# Patient Record
Sex: Female | Born: 1985 | Race: White | Hispanic: No | Marital: Single | Smoking: Current some day smoker
Health system: Southern US, Community
[De-identification: ages and names within clinical notes are randomized; demographics above are authoritative.]

## PROBLEM LIST (undated history)

## (undated) DIAGNOSIS — G4730 Sleep apnea, unspecified: Secondary | ICD-10-CM

## (undated) DIAGNOSIS — F419 Anxiety disorder, unspecified: Secondary | ICD-10-CM

## (undated) DIAGNOSIS — R7303 Prediabetes: Secondary | ICD-10-CM

## (undated) DIAGNOSIS — F3181 Bipolar II disorder: Secondary | ICD-10-CM

## (undated) DIAGNOSIS — I517 Cardiomegaly: Secondary | ICD-10-CM

## (undated) DIAGNOSIS — F4310 Post-traumatic stress disorder, unspecified: Secondary | ICD-10-CM

## (undated) DIAGNOSIS — R0600 Dyspnea, unspecified: Secondary | ICD-10-CM

## (undated) DIAGNOSIS — N950 Postmenopausal bleeding: Secondary | ICD-10-CM

## (undated) DIAGNOSIS — F329 Major depressive disorder, single episode, unspecified: Secondary | ICD-10-CM

## (undated) DIAGNOSIS — F319 Bipolar disorder, unspecified: Secondary | ICD-10-CM

## (undated) DIAGNOSIS — R011 Cardiac murmur, unspecified: Secondary | ICD-10-CM

## (undated) DIAGNOSIS — Z8679 Personal history of other diseases of the circulatory system: Secondary | ICD-10-CM

## (undated) DIAGNOSIS — S3992XA Unspecified injury of lower back, initial encounter: Secondary | ICD-10-CM

## (undated) DIAGNOSIS — K219 Gastro-esophageal reflux disease without esophagitis: Secondary | ICD-10-CM

## (undated) DIAGNOSIS — T8859XA Other complications of anesthesia, initial encounter: Secondary | ICD-10-CM

## (undated) DIAGNOSIS — M1380 Other specified arthritis, unspecified site: Secondary | ICD-10-CM

## (undated) DIAGNOSIS — Z8041 Family history of malignant neoplasm of ovary: Secondary | ICD-10-CM

## (undated) DIAGNOSIS — H40052 Ocular hypertension, left eye: Secondary | ICD-10-CM

## (undated) DIAGNOSIS — R519 Headache, unspecified: Secondary | ICD-10-CM

## (undated) DIAGNOSIS — F32A Depression, unspecified: Secondary | ICD-10-CM

## (undated) HISTORY — DX: Family history of malignant neoplasm of ovary: Z80.41

## (undated) HISTORY — PX: APPENDECTOMY: SHX54

## (undated) HISTORY — PX: INTRAOCULAR LENS IMPLANT, SECONDARY: SHX1842

## (undated) HISTORY — PX: SACROILIAC JOINT INJECTION: SHX2370

---
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
## 2005-02-20 ENCOUNTER — Emergency Department: Payer: Self-pay | Admitting: Emergency Medicine

## 2005-12-04 ENCOUNTER — Emergency Department: Payer: Self-pay | Admitting: Emergency Medicine

## 2007-01-29 ENCOUNTER — Emergency Department: Payer: Self-pay

## 2007-04-30 ENCOUNTER — Inpatient Hospital Stay: Payer: Self-pay | Admitting: Surgery

## 2007-05-24 ENCOUNTER — Emergency Department: Payer: Self-pay | Admitting: Emergency Medicine

## 2007-05-28 ENCOUNTER — Emergency Department: Payer: Self-pay | Admitting: General Practice

## 2007-07-14 ENCOUNTER — Emergency Department: Payer: Self-pay | Admitting: Emergency Medicine

## 2007-08-30 ENCOUNTER — Emergency Department: Payer: Self-pay | Admitting: Emergency Medicine

## 2007-12-15 HISTORY — PX: APPENDECTOMY: SHX54

## 2008-03-23 ENCOUNTER — Emergency Department: Payer: Self-pay | Admitting: Emergency Medicine

## 2008-11-26 ENCOUNTER — Emergency Department: Payer: Self-pay | Admitting: Emergency Medicine

## 2008-12-01 ENCOUNTER — Emergency Department: Payer: Self-pay | Admitting: Internal Medicine

## 2009-01-19 ENCOUNTER — Emergency Department: Payer: Self-pay | Admitting: Emergency Medicine

## 2009-03-02 ENCOUNTER — Emergency Department: Payer: Self-pay | Admitting: Emergency Medicine

## 2009-03-05 ENCOUNTER — Emergency Department: Payer: Self-pay | Admitting: Internal Medicine

## 2009-03-09 ENCOUNTER — Emergency Department: Payer: Self-pay | Admitting: Emergency Medicine

## 2009-03-16 ENCOUNTER — Emergency Department: Payer: Self-pay | Admitting: Emergency Medicine

## 2009-11-08 ENCOUNTER — Emergency Department: Payer: Self-pay | Admitting: Emergency Medicine

## 2009-11-12 ENCOUNTER — Emergency Department: Payer: Self-pay | Admitting: Emergency Medicine

## 2009-11-19 ENCOUNTER — Emergency Department: Payer: Self-pay | Admitting: Internal Medicine

## 2010-05-23 ENCOUNTER — Emergency Department: Payer: Self-pay | Admitting: Emergency Medicine

## 2010-05-29 ENCOUNTER — Emergency Department: Payer: Self-pay | Admitting: Emergency Medicine

## 2010-06-04 ENCOUNTER — Emergency Department: Payer: Self-pay | Admitting: Emergency Medicine

## 2011-04-07 ENCOUNTER — Ambulatory Visit: Payer: Self-pay | Admitting: Internal Medicine

## 2011-04-10 ENCOUNTER — Ambulatory Visit: Payer: Self-pay | Admitting: Internal Medicine

## 2012-01-06 ENCOUNTER — Emergency Department: Payer: Self-pay | Admitting: Emergency Medicine

## 2012-07-16 ENCOUNTER — Emergency Department: Payer: Self-pay | Admitting: Emergency Medicine

## 2012-07-16 LAB — CBC
HGB: 14 g/dL (ref 12.0–16.0)
MCH: 30.4 pg (ref 26.0–34.0)
MCV: 90 fL (ref 80–100)
RBC: 4.6 10*6/uL (ref 3.80–5.20)
WBC: 14 10*3/uL — ABNORMAL HIGH (ref 3.6–11.0)

## 2012-07-16 LAB — DRUG SCREEN, URINE
Amphetamines, Ur Screen: NEGATIVE (ref ?–1000)
Barbiturates, Ur Screen: NEGATIVE (ref ?–200)
Benzodiazepine, Ur Scrn: NEGATIVE (ref ?–200)
Cocaine Metabolite,Ur ~~LOC~~: NEGATIVE (ref ?–300)
Methadone, Ur Screen: NEGATIVE (ref ?–300)
Opiate, Ur Screen: POSITIVE (ref ?–300)
Tricyclic, Ur Screen: NEGATIVE (ref ?–1000)

## 2012-07-16 LAB — COMPREHENSIVE METABOLIC PANEL
Alkaline Phosphatase: 80 U/L (ref 50–136)
BUN: 13 mg/dL (ref 7–18)
Bilirubin,Total: 0.2 mg/dL (ref 0.2–1.0)
Co2: 23 mmol/L (ref 21–32)
Creatinine: 0.69 mg/dL (ref 0.60–1.30)
EGFR (Non-African Amer.): >60
Potassium: 4 mmol/L (ref 3.5–5.1)
SGOT(AST): 21 U/L (ref 15–37)
SGPT (ALT): 23 U/L (ref 12–78)
Total Protein: 7.6 g/dL (ref 6.4–8.2)

## 2012-07-16 LAB — ETHANOL
Ethanol %: <0.003 % (ref 0.000–0.080)
Ethanol: <3 mg/dL

## 2012-07-16 LAB — ACETAMINOPHEN LEVEL: Acetaminophen: <2 ug/mL

## 2012-12-04 ENCOUNTER — Emergency Department: Payer: Self-pay | Admitting: Emergency Medicine

## 2012-12-08 ENCOUNTER — Emergency Department: Payer: Self-pay | Admitting: Emergency Medicine

## 2012-12-17 ENCOUNTER — Emergency Department: Payer: Self-pay | Admitting: Emergency Medicine

## 2013-01-01 ENCOUNTER — Emergency Department: Payer: Self-pay | Admitting: Internal Medicine

## 2013-04-15 ENCOUNTER — Emergency Department: Payer: Self-pay | Admitting: Internal Medicine

## 2013-04-15 LAB — BASIC METABOLIC PANEL
BUN: 16 mg/dL (ref 7–18)
Calcium, Total: 9.1 mg/dL (ref 8.5–10.1)
Co2: 22 mmol/L (ref 21–32)
Creatinine: 0.63 mg/dL (ref 0.60–1.30)
EGFR (African American): >60
EGFR (Non-African Amer.): >60
Glucose: 89 mg/dL (ref 65–99)
Potassium: 3.8 mmol/L (ref 3.5–5.1)
Sodium: 139 mmol/L (ref 136–145)

## 2013-04-15 LAB — CBC
HCT: 40.9 % (ref 35.0–47.0)
HGB: 14 g/dL (ref 12.0–16.0)
MCH: 29.7 pg (ref 26.0–34.0)
RDW: 13.3 % (ref 11.5–14.5)
WBC: 13 10*3/uL — ABNORMAL HIGH (ref 3.6–11.0)

## 2013-06-20 ENCOUNTER — Emergency Department: Payer: Self-pay | Admitting: Emergency Medicine

## 2013-08-06 ENCOUNTER — Emergency Department: Payer: Self-pay | Admitting: Emergency Medicine

## 2013-12-11 ENCOUNTER — Emergency Department: Payer: Self-pay | Admitting: Emergency Medicine

## 2014-03-18 ENCOUNTER — Emergency Department: Payer: Self-pay | Admitting: Emergency Medicine

## 2014-03-23 ENCOUNTER — Emergency Department: Payer: Self-pay | Admitting: Emergency Medicine

## 2014-04-21 ENCOUNTER — Emergency Department: Payer: Self-pay | Admitting: Emergency Medicine

## 2014-11-12 DIAGNOSIS — K219 Gastro-esophageal reflux disease without esophagitis: Secondary | ICD-10-CM | POA: Insufficient documentation

## 2014-11-15 ENCOUNTER — Ambulatory Visit: Payer: Self-pay | Admitting: Gastroenterology

## 2015-03-02 ENCOUNTER — Emergency Department: Payer: Self-pay | Admitting: Physician Assistant

## 2015-04-02 NOTE — Consult Note (Signed)
Brief Consult Note: Diagnosis: Mood disorder NOS, Panic disorder w/o agoraphobia.   Patient was seen by consultant.   Consult note dictated.   Recommend further assessment or treatment.   Orders entered.   Discussed with Attending MD.   Comments: Ms. Gina Farmer has a h/o depression and anxiety. She came to ER complaining of worsening deporession and suicidal thoughts with a plan to drive a car off the bridge in the context of multiple social stressors. She is no longer suicidal or homiciodal. She tolerates medications well.   PLAN 1. The patient no longer meets criteria for IVC. I will terminate proceedings. Please discharge as appropriate.   2. She is to continue Celaxa 40 mg, Risperdal 2 mg bid, Trazodone 50 mg. Rx written.   3. [facility] Thursday, August 8th, with Kingsley PlanAngela Ford.   4. Info on [facility] was provided..  Electronic Signatures: Kristine LineaPucilowska, Jannice Beitzel (MD)  (Signed 05-Aug-13 12:00)  Authored: Brief Consult Note   Last Updated: 05-Aug-13 12:00 by Kristine LineaPucilowska, Lashonda Sonneborn (MD)

## 2015-04-02 NOTE — Consult Note (Signed)
PATIENT NAME:  Gina Farmer, Gina Farmer MR#:  [id number] DATE OF BIRTH:  27-Dec-1985  DATE OF CONSULTATION:  07/18/2012  REFERRING PHYSICIAN:  Olivia Mackie, MD  CONSULTING PHYSICIAN:  Damiana Berrian B. Zayven Powe, MD  REASON FOR CONSULTATION: To evaluate a suicidal patient.   IDENTIFYING DATA: Gina Farmer is a 29 year old female with history of bipolar disorder.   CHIEF COMPLAINT: "I feel so much better now".   HISTORY OF PRESENT ILLNESS: Gina Farmer reports that she has been under considerable stress lately. She lost one of her two jobs, has not been able to pay car payment. Even though there were only two installments left, her car was repossessed. She became increasingly depressed over the period of the past two weeks. She started drinking shots of vodka and developed thoughts of hurting herself. She had a plan to drive off a bridge in her car. She reports that she used to be a cutter, last time cut 10 years ago and does not want to start again. She came to the hospital for help. She reports poor sleep, decreased appetite, anhedonia, feeling of guilt, hopelessness, worthlessness, poor energy and concentration, social isolation, crying spells, and recently suicidal ideation. She denies any psychotic symptoms. She denies symptoms suggestive of bipolar mania. She denies other than alcohol illicit substance use. There is no prescription pill abuse.   PAST PSYCHIATRIC HISTORY: As above, she used to cut when younger. She was diagnosed with bipolar by Dr. Alver Fisher a year or so ago. She was started on Tegretol. She took it for three months with no improvement and then stopped. She denies any prior suicide attempts. There were no psychiatric hospitalizations.   FAMILY PSYCHIATRIC HISTORY: None reported.   PAST MEDICAL HISTORY: None.   ALLERGIES: No known drug allergies.   MEDICATIONS ON ADMISSION: None.   SOCIAL HISTORY: She is employed at [employer] in [employer] section. She lives with her friend and the friend's  husband. They are supportive. This is a good arrangement for her. She just lost her second job and was unable to pay car payment.   REVIEW OF SYSTEMS: CONSTITUTIONAL: No fevers or chills. No weight changes. EYES: No double or blurred vision. ENT: No hearing loss. RESPIRATORY: No shortness of breath or cough. CARDIOVASCULAR: No chest pain or orthopnea. GASTROINTESTINAL: No abdominal pain, nausea, vomiting, or diarrhea. GU: No incontinence or frequency. ENDOCRINE: No heat or cold intolerance. LYMPHATIC: No anemia or easy bruising. INTEGUMENTARY: No acne or rash. MUSCULOSKELETAL: No muscle or joint pain. NEUROLOGIC: No tingling or weakness. PSYCHIATRIC: See history of present illness for details.   PHYSICAL EXAMINATION:   VITAL SIGNS: Blood pressure 108/58, pulse 76, respirations 16, temperature 96.8.   GENERAL: This is a slightly obese young female in no acute distress. The rest of the physical examination is deferred to her primary attending.   LABORATORY DATA: Chemistries are within normal limits. Blood alcohol level 0. LFTs within normal limits. TSH 2.33. Urine tox screen positive for opiates. CBC within normal limits except for white blood count of 14.0. Serum acetaminophen and salicylates are low.   EKG normal sinus rhythm, normal EKG.   MENTAL STATUS EXAMINATION: The patient is alert and oriented to person, place, time, and situation. She is pleasant, polite, and cooperative. She is wearing hospital scrubs. She maintains good eye contact. Her speech is of normal rhythm, rate, and volume. Mood is fine with full affect. Thought processing is logical and goal oriented. Thought content she denies suicidal or homicidal ideation. There are no delusions  or paranoia. There are no auditory or visual hallucinations. Her cognition is grossly intact. She registers 3 out of 3 and recalls 3 out of 3 objects after five minutes. She can spell world forward and backward. She knows the current president. Her insight  and judgment are questionable.   SUICIDE RISK ASSESSMENT: This is a patient with history of mood instability and substance abuse who is under considerable stress from financial problems. She is forward thinking and optimistic about the future in spite of multiple problems. She was placed on medications, tolerated them well, and is willing to follow-up with a psychiatrist in the community.   DIAGNOSES:  AXIS I:  1. Mood disorder, not otherwise specified. 2. Opiate abuse. 3. Alcohol abuse.   AXIS II: Deferred.   AXIS III: Obesity.   AXIS IV: Mental illness, substance abuse, primary support, access to care, financial, transportation.   AXIS V: GAF 45.   PLAN:  1. The patient no longer meets criteria for involuntary inpatient psychiatric commitment. I will terminate proceedings. Please discharge as appropriate.  2. She is to continue Celexa 40 mg, Risperdal 2 mg twice daily, and trazodone 50 mg at night as prescribed by Dr. Guss Bundehalla in the Emergency Room.  3. She will follow-up with [facility] on Thursday, August 8th, at 10:30. 4. She was provided with information about [facility]. She complains of pinched nerve in her leg.   ____________________________ Ellin GoodieJolanta B. Jennet MaduroPucilowska, MD jbp:drc D: 07/18/2012 16:34:30 ET T: 07/19/2012 09:37:22 ET JOB#: [id number]  cc: Quartez Lagos B. Jennet MaduroPucilowska, MD, <Dictator> Shari ProwsJOLANTA B Hanne Kegg MD ELECTRONICALLY SIGNED 07/21/2012 23:09

## 2015-04-08 LAB — SURGICAL PATHOLOGY

## 2015-06-09 ENCOUNTER — Emergency Department
Admission: EM | Admit: 2015-06-09 | Discharge: 2015-06-09 | Disposition: A | Discharge status: Home / Self Care | Payer: BLUE CROSS/BLUE SHIELD | Attending: Emergency Medicine | Admitting: Emergency Medicine

## 2015-06-09 ENCOUNTER — Encounter: Payer: Self-pay | Admitting: Emergency Medicine

## 2015-06-09 DIAGNOSIS — Y998 Other external cause status: Secondary | ICD-10-CM | POA: Insufficient documentation

## 2015-06-09 DIAGNOSIS — Z720 Tobacco use: Secondary | ICD-10-CM | POA: Insufficient documentation

## 2015-06-09 DIAGNOSIS — S39012A Strain of muscle, fascia and tendon of lower back, initial encounter: Secondary | ICD-10-CM | POA: Insufficient documentation

## 2015-06-09 DIAGNOSIS — Y9389 Activity, other specified: Secondary | ICD-10-CM | POA: Insufficient documentation

## 2015-06-09 DIAGNOSIS — Y9289 Other specified places as the place of occurrence of the external cause: Secondary | ICD-10-CM | POA: Insufficient documentation

## 2015-06-09 DIAGNOSIS — Z79899 Other long term (current) drug therapy: Secondary | ICD-10-CM | POA: Insufficient documentation

## 2015-06-09 DIAGNOSIS — X58XXXA Exposure to other specified factors, initial encounter: Secondary | ICD-10-CM | POA: Insufficient documentation

## 2015-06-09 HISTORY — DX: Unspecified injury of lower back, initial encounter: S39.92XA

## 2015-06-09 MED ORDER — DEXAMETHASONE SODIUM PHOSPHATE 10 MG/ML IJ SOLN
INTRAMUSCULAR | Status: AC
  Administered 2015-06-09: 10 mg via INTRAMUSCULAR
  Filled 2015-06-09: qty 1

## 2015-06-09 MED ORDER — METHOCARBAMOL 750 MG PO TABS: 1500.0000 mg | ORAL_TABLET | Freq: Four times a day (QID) | ORAL | Status: DC

## 2015-06-09 MED ORDER — OXYCODONE-ACETAMINOPHEN 7.5-325 MG PO TABS: 1.0000 | ORAL_TABLET | Freq: Four times a day (QID) | ORAL | Status: DC | PRN

## 2015-06-09 MED ORDER — DEXAMETHASONE SODIUM PHOSPHATE 10 MG/ML IJ SOLN
10.0000 mg | Freq: Once | INTRAMUSCULAR | Status: AC
  Administered 2015-06-09: 10 mg via INTRAMUSCULAR

## 2015-06-09 MED ORDER — HYDROMORPHONE HCL 1 MG/ML IJ SOLN
1.0000 mg | Freq: Once | INTRAMUSCULAR | Status: AC
  Administered 2015-06-09: 1 mg via INTRAMUSCULAR

## 2015-06-09 MED ORDER — ORPHENADRINE CITRATE 30 MG/ML IJ SOLN
INTRAMUSCULAR | Status: AC
  Administered 2015-06-09: 60 mg via INTRAMUSCULAR
  Filled 2015-06-09: qty 2

## 2015-06-09 MED ORDER — HYDROMORPHONE HCL 1 MG/ML IJ SOLN
INTRAMUSCULAR | Status: AC
  Administered 2015-06-09: 1 mg via INTRAMUSCULAR
  Filled 2015-06-09: qty 1

## 2015-06-09 MED ORDER — ORPHENADRINE CITRATE 30 MG/ML IJ SOLN
60.0000 mg | Freq: Two times a day (BID) | INTRAMUSCULAR | Status: DC
  Administered 2015-06-09: 60 mg via INTRAMUSCULAR

## 2015-06-09 NOTE — ED Provider Notes (Signed)
[facility] Emergency Department Provider Note  ____________________________________________  Time seen: Approximately 3:10 PM  I have reviewed the triage vital signs and the nursing notes.   HISTORY  Chief Complaint Back Pain    HPI Gina Farmer is a 29 y.o. female patiently currently complaining of radicular low back pain to the right lower extremity. Onset 2 days ago status post is an 50 pounds of dog food upon her shoulder. Patient states since the list incident she's had low back pain seems to be increasing. Patient denies any bladder or bowel dysfunction. Patient states she's had difficulty coming from a sitting to standing position. Patient's pain rated as a 10 over 10. Patient has extensive back pain history. Has not seen any specialist since March 2015. There's been no surgical interventions. Patient diagnosed with a protruding disc. Patient's using leftover Valium at home which is not helping her pain.  Past Medical History  Diagnosis Date  . Back injury     There are no active problems to display for this patient.   Past Surgical History  Procedure Laterality Date  . Appendectomy    . Intraocular lens implant, secondary      Current Outpatient Rx  Name  Route  Sig  Dispense  Refill  . methocarbamol (ROBAXIN-750) 750 MG tablet   Oral   Take 2 tablets (1,500 mg total) by mouth 4 (four) times daily.   40 tablet   0   . oxyCODONE-acetaminophen (PERCOCET) 7.5-325 MG per tablet   Oral   Take 1 tablet by mouth every 6 (six) hours as needed for severe pain.   12 tablet   0     Allergies Review of patient's allergies indicates no known allergies.  History reviewed. No pertinent family history.  Social History History  Substance Use Topics  . Smoking status: Current Every Day Smoker -- 0.50 packs/day    Types: Cigarettes  . Smokeless tobacco: Not on file  . Alcohol Use: No    Review of Systems Constitutional: No  fever/chills Eyes: No visual changes. ENT: No sore throat. Cardiovascular: Denies chest pain. Respiratory: Denies shortness of breath. Gastrointestinal: No abdominal pain.  No nausea, no vomiting.  No diarrhea.  No constipation. Genitourinary: Negative for dysuria. Musculoskeletal positive for back pain. Skin: Negative for rash. Neurological: Negative for headaches, focal weakness or numbness. 10-point ROS otherwise negative.  ____________________________________________   PHYSICAL EXAM:  VITAL SIGNS: ED Triage Vitals  Enc Vitals Group     BP 06/09/15 1445 127/73 mmHg     Pulse Rate 06/09/15 1445 82     Resp --      Temp 06/09/15 1445 97.6 F (36.4 C)     Temp Source 06/09/15 1445 Oral     SpO2 06/09/15 1445 99 %     Weight 06/09/15 1445 276 lb (125.193 kg)     Height 06/09/15 1445  (1.549 m)     Head Cir --      Peak Flow --      Pain Score 06/09/15 1446 10     Pain Loc --      Pain Edu? --      Excl. in GC? --     Constitutional: Alert and oriented. Well appearing and in no acute distress. Patient mildly obese. Eyes: Conjunctivae are normal. PERRL. EOMI. Head: Atraumatic. Nose: No congestion/rhinnorhea. Mouth/Throat: Mucous membranes are moist.  Oropharynx non-erythematous. Neck: No stridor. No cervical spine tenderness to palpation. Hematological/Lymphatic/Immunilogical: No cervical lymphadenopathy. Cardiovascular:  Normal rate, regular rhythm. Grossly normal heart sounds.  Good peripheral circulation. Respiratory: Normal respiratory effort.  No retractions. Lungs CTAB. Gastrointestinal: Soft and nontender. No distention. No abdominal bruits. No CVA tenderness. Musculoskeletal: No obvious spinal deformity. CVA guarding. Patient's tender palpation L3-S1. Patient unable to exit the pain of the help. She has decreased range of motion's all fields. On sitting patient has a positive straight leg raises 70. Neurologic:  Normal speech and language. No gross focal  neurologic deficits are appreciated. Speech is normal. No gait instability. Skin:  Skin is warm, dry and intact. No rash noted. Psychiatric: Mood and affect are normal. Speech and behavior are normal.  ____________________________________________   LABS (all labs ordered are listed, but only abnormal results are displayed)  Labs Reviewed - No data to display ____________________________________________  EKG   ____________________________________________  RADIOLOGY   ____________________________________________   PROCEDURES  Procedure(s) performed: None  Critical Care performed: No  ____________________________________________   INITIAL IMPRESSION / ASSESSMENT AND PLAN / ED COURSE  Pertinent labs & imaging results that were available during my care of the patient were reviewed by me and considered in my medical decision making (see chart for details).   Right.radicular low back pain with muscle strain. Patient reports decreased pain status post a lot of Norflex and Decadron given IM. Patient will be discharged prescription for Percocets and Robaxin. Patient advised follow-up with her family doctor for reevaluation and continue care in 2-3 days. ____________________________________________   FINAL CLINICAL IMPRESSION(S) / ED DIAGNOSES  Final diagnoses:  Lumbar strain, initial encounter      Joni Reining, PA-C 06/09/15 1635  Maurilio Lovely, MD 06/10/15 2214

## 2015-06-09 NOTE — ED Notes (Signed)
lifting dog food on Thursday , right lower back pain since, attempting to use valium at home to relieve pain

## 2015-06-09 NOTE — ED Notes (Signed)
Pt complains of lower back pain that started on Thursday.  Pt states that pain is more to the right side and complains of intermittent spasms radiating down right leg.

## 2015-08-07 ENCOUNTER — Emergency Department
Admission: EM | Admit: 2015-08-07 | Discharge: 2015-08-07 | Disposition: A | Discharge status: Home / Self Care | Payer: Self-pay | Attending: Emergency Medicine | Admitting: Emergency Medicine

## 2015-08-07 ENCOUNTER — Encounter: Payer: Self-pay | Admitting: Emergency Medicine

## 2015-08-07 DIAGNOSIS — Z720 Tobacco use: Secondary | ICD-10-CM | POA: Insufficient documentation

## 2015-08-07 DIAGNOSIS — F329 Major depressive disorder, single episode, unspecified: Secondary | ICD-10-CM | POA: Insufficient documentation

## 2015-08-07 DIAGNOSIS — F419 Anxiety disorder, unspecified: Secondary | ICD-10-CM | POA: Insufficient documentation

## 2015-08-07 HISTORY — DX: Depression, unspecified: F32.A

## 2015-08-07 HISTORY — DX: Anxiety disorder, unspecified: F41.9

## 2015-08-07 HISTORY — DX: Major depressive disorder, single episode, unspecified: F32.9

## 2015-08-07 LAB — BASIC METABOLIC PANEL
ANION GAP: 7 (ref 5–15)
BUN: 17 mg/dL (ref 6–20)
CALCIUM: 8.8 mg/dL — AB (ref 8.9–10.3)
CHLORIDE: 108 mmol/L (ref 101–111)
CO2: 24 mmol/L (ref 22–32)
Creatinine, Ser: 0.8 mg/dL (ref 0.44–1.00)
GFR calc Af Amer: >60 mL/min (ref >60)
GFR calc non Af Amer: >60 mL/min (ref >60)
Glucose, Bld: 101 mg/dL — ABNORMAL HIGH (ref 65–99)
Potassium: 3.8 mmol/L (ref 3.5–5.1)
SODIUM: 139 mmol/L (ref 135–145)

## 2015-08-07 LAB — URINE DRUG SCREEN, QUALITATIVE (ARMC ONLY)
Amphetamines, Ur Screen: NOT DETECTED
BENZODIAZEPINE, UR SCRN: NOT DETECTED
Barbiturates, Ur Screen: NOT DETECTED
CANNABINOID 50 NG, UR ~~LOC~~: NOT DETECTED
Cocaine Metabolite,Ur ~~LOC~~: NOT DETECTED
MDMA (Ecstasy)Ur Screen: NOT DETECTED
Methadone Scn, Ur: NOT DETECTED
Opiate, Ur Screen: NOT DETECTED
PHENCYCLIDINE (PCP) UR S: NOT DETECTED
Tricyclic, Ur Screen: NOT DETECTED

## 2015-08-07 LAB — CBC WITH DIFFERENTIAL/PLATELET
Basophils Absolute: 0.1 10*3/uL (ref 0–0.1)
Basophils Relative: 1 %
Eosinophils Absolute: 0.4 10*3/uL (ref 0–0.7)
Eosinophils Relative: 3 %
HEMATOCRIT: 39.8 % (ref 35.0–47.0)
HEMOGLOBIN: 13.2 g/dL (ref 12.0–16.0)
Lymphocytes Relative: 37 %
Lymphs Abs: 4.7 10*3/uL — ABNORMAL HIGH (ref 1.0–3.6)
MCH: 29.1 pg (ref 26.0–34.0)
MCHC: 33.1 g/dL (ref 32.0–36.0)
MCV: 87.9 fL (ref 80.0–100.0)
MONOS PCT: 7 %
Monocytes Absolute: 0.8 10*3/uL (ref 0.2–0.9)
NEUTROS ABS: 6.6 10*3/uL — AB (ref 1.4–6.5)
NEUTROS PCT: 52 %
Platelets: 275 10*3/uL (ref 150–440)
RBC: 4.52 MIL/uL (ref 3.80–5.20)
RDW: 13.5 % (ref 11.5–14.5)
WBC: 12.6 10*3/uL — AB (ref 3.6–11.0)

## 2015-08-07 LAB — T4, FREE: FREE T4: 0.7 ng/dL (ref 0.61–1.12)

## 2015-08-07 LAB — TSH: TSH: 1.255 u[IU]/mL (ref 0.350–4.500)

## 2015-08-07 MED ORDER — DIAZEPAM 2 MG PO TABS: 2.0000 mg | ORAL_TABLET | Freq: Three times a day (TID) | ORAL | Status: AC | PRN

## 2015-08-07 NOTE — Discharge Instructions (Signed)
You were evaluated for anxiety and depression. We discussed, return to the emergency department for any new or worsening condition including any worsening anxiety, or depression, or any thoughts of going to hurt herself or others. I am prescribing 5 tablets of Valium for help with acute symptoms until you are seen in follow-up.   Panic Attacks Panic attacks are sudden, short feelings of great fear or discomfort. You may have them for no reason when you are relaxed, when you are uneasy (anxious), or when you are sleeping.  HOME CARE 1. Take all your medicines as told. 2. Check with your doctor before starting new medicines. 3. Keep all doctor visits. GET HELP IF:  You are not able to take your medicines as told.  Your symptoms do not get better.  Your symptoms get worse. GET HELP RIGHT AWAY IF:  Your attacks seem different than your normal attacks.  You have thoughts about hurting yourself or others.  You take panic attack medicine and you have a side effect. MAKE SURE YOU:  Understand these instructions.  Will watch your condition.  Will get help right away if you are not doing well or get worse. Document Released: 01/02/2011 Document Revised: 09/20/2013 Document Reviewed: 07/14/2013 [facility] Patient Information 2015 [place], [place]. This information is not intended to replace advice given to you by your health care provider. Make sure you discuss any questions you have with your health care provider.   Depression Depression refers to feeling sad, low, down in the dumps, blue, gloomy, or empty. In general, there are two kinds of depression: 4. Normal sadness or normal grief. This kind of depression is one that we all feel from time to time after upsetting life experiences, such as the loss of a job or the ending of a relationship. This kind of depression is considered normal, is short lived, and resolves within a few days to 2 weeks. Depression experienced after the loss of a  loved one (bereavement) often lasts longer than 2 weeks but normally gets better with time. 5. Clinical depression. This kind of depression lasts longer than normal sadness or normal grief or interferes with your ability to function at home, at work, and in school. It also interferes with your personal relationships. It affects almost every aspect of your life. Clinical depression is an illness. Symptoms of depression can also be caused by conditions other than those mentioned above, such as:  Physical illness. Some physical illnesses, including underactive thyroid gland (hypothyroidism), severe anemia, specific types of cancer, diabetes, uncontrolled seizures, heart and lung problems, strokes, and chronic pain are commonly associated with symptoms of depression.  Side effects of some prescription medicine. In some people, certain types of medicine can cause symptoms of depression.  Substance abuse. Abuse of alcohol and illicit drugs can cause symptoms of depression. SYMPTOMS Symptoms of normal sadness and normal grief include the following:  Feeling sad or crying for short periods of time.  Not caring about anything (apathy).  Difficulty sleeping or sleeping too much.  No longer able to enjoy the things you used to enjoy.  Desire to be by oneself all the time (social isolation).  Lack of energy or motivation.  Difficulty concentrating or remembering.  Change in appetite or weight.  Restlessness or agitation. Symptoms of clinical depression include the same symptoms of normal sadness or normal grief and also the following symptoms:  Feeling sad or crying all the time.  Feelings of guilt or worthlessness.  Feelings of hopelessness or helplessness.  Thoughts of suicide or the desire to harm yourself (suicidal ideation).  Loss of touch with reality (psychotic symptoms). Seeing or hearing things that are not real (hallucinations) or having false beliefs about your life or the  people around you (delusions and paranoia). DIAGNOSIS  The diagnosis of clinical depression is usually based on how bad the symptoms are and how long they have lasted. Your health care provider will also ask you questions about your medical history and substance use to find out if physical illness, use of prescription medicine, or substance abuse is causing your depression. Your health care provider may also order blood tests. TREATMENT  Often, normal sadness and normal grief do not require treatment. However, sometimes antidepressant medicine is given for bereavement to ease the depressive symptoms until they resolve. The treatment for clinical depression depends on how bad the symptoms are but often includes antidepressant medicine, counseling with a mental health professional, or both. Your health care provider will help to determine what treatment is best for you. Depression caused by physical illness usually goes away with appropriate medical treatment of the illness. If prescription medicine is causing depression, talk with your health care provider about stopping the medicine, decreasing the dose, or changing to another medicine. Depression caused by the abuse of alcohol or illicit drugs goes away when you stop using these substances. Some adults need professional help in order to stop drinking or using drugs. SEEK IMMEDIATE MEDICAL CARE IF:  You have thoughts about hurting yourself or others.  You lose touch with reality (have psychotic symptoms).  You are taking medicine for depression and have a serious side effect. FOR MORE INFORMATION  National Alliance on Mental Illness: www.nami.[employer] of Mental Health: http://www.maynard.net/ Document Released: 11/27/2000 Document Revised: 04/16/2014 Document Reviewed: 02/29/2012 [facility] Patient Information 2015 [place], [place]. This information is not intended to replace advice given to you by your health care provider. Make sure you  discuss any questions you have with your health care provider.
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
## 2015-08-07 NOTE — ED Notes (Signed)
Pt presents with anxiety, depression and trouble sleeping for two weeks. Crisis team sent pt over to be evaluated. Pt denies any SI or HI.

## 2016-03-17 ENCOUNTER — Other Ambulatory Visit: Payer: Self-pay | Admitting: Chiropractic Medicine

## 2016-03-17 ENCOUNTER — Ambulatory Visit
Admission: RE | Admit: 2016-03-17 | Discharge: 2016-03-17 | Disposition: A | Discharge status: Home / Self Care | Payer: Self-pay | Source: Ambulatory Visit | Attending: Chiropractic Medicine | Admitting: Chiropractic Medicine

## 2016-03-17 DIAGNOSIS — S134XXA Sprain of ligaments of cervical spine, initial encounter: Secondary | ICD-10-CM

## 2016-03-17 DIAGNOSIS — S233XXA Sprain of ligaments of thoracic spine, initial encounter: Secondary | ICD-10-CM

## 2016-03-17 DIAGNOSIS — S336XXA Sprain of sacroiliac joint, initial encounter: Secondary | ICD-10-CM

## 2016-03-17 DIAGNOSIS — S29012A Strain of muscle and tendon of back wall of thorax, initial encounter: Principal | ICD-10-CM

## 2016-06-28 ENCOUNTER — Emergency Department
Admission: EM | Admit: 2016-06-28 | Discharge: 2016-06-28 | Disposition: A | Discharge status: Home / Self Care | Payer: Self-pay | Attending: Emergency Medicine | Admitting: Emergency Medicine

## 2016-06-28 ENCOUNTER — Encounter: Payer: Self-pay | Admitting: Emergency Medicine

## 2016-06-28 ENCOUNTER — Emergency Department: Payer: Self-pay

## 2016-06-28 DIAGNOSIS — F319 Bipolar disorder, unspecified: Secondary | ICD-10-CM | POA: Insufficient documentation

## 2016-06-28 DIAGNOSIS — R519 Headache, unspecified: Secondary | ICD-10-CM

## 2016-06-28 DIAGNOSIS — R51 Headache: Secondary | ICD-10-CM | POA: Insufficient documentation

## 2016-06-28 DIAGNOSIS — H9202 Otalgia, left ear: Secondary | ICD-10-CM | POA: Insufficient documentation

## 2016-06-28 DIAGNOSIS — F17210 Nicotine dependence, cigarettes, uncomplicated: Secondary | ICD-10-CM | POA: Insufficient documentation

## 2016-06-28 HISTORY — DX: Bipolar disorder, unspecified: F31.9

## 2016-06-28 MED ORDER — AMOXICILLIN 500 MG PO CAPS: 500.0000 mg | ORAL_CAPSULE | Freq: Three times a day (TID) | ORAL | Status: DC

## 2016-06-28 MED ORDER — TRAMADOL HCL 50 MG PO TABS: 50.0000 mg | ORAL_TABLET | Freq: Four times a day (QID) | ORAL | Status: AC | PRN

## 2016-06-28 MED ORDER — FEXOFENADINE-PSEUDOEPHED ER 60-120 MG PO TB12: 1.0000 | ORAL_TABLET | Freq: Two times a day (BID) | ORAL | Status: DC

## 2016-06-28 NOTE — ED Notes (Signed)
States she has had intermittent headaches for about 2 weeks states had some pain relief with IBU  But developed left ear pain yesterday

## 2016-06-28 NOTE — ED Provider Notes (Signed)
[facility] Emergency Department Provider Note   ____________________________________________  Time seen: Approximately 3:21 PM  I have reviewed the triage vital signs and the nursing notes.   HISTORY  Chief Complaint Otalgia and Headache    HPI Gina Farmer is a 30 y.o. female patient complain intermitting pressure headache for 2 weeks. Patient stated some moderate relief with ibuprofen. Patient state yesterday she developed left ear pain that radiates to her teeth. Patient denies any fevers chills associated with this complaint. Patient denies rhinorrhea. No other palliative measures for his complaint. Patient rates her pain as 8/10.   Past Medical History  Diagnosis Date  . Back injury   . Depression   . Anxiety   . Bipolar 1 disorder (HCC)     There are no active problems to display for this patient.   Past Surgical History  Procedure Laterality Date  . Appendectomy    . Intraocular lens implant, secondary      Current Outpatient Rx  Name  Route  Sig  Dispense  Refill  . amoxicillin (AMOXIL) 500 MG capsule   Oral   Take 1 capsule (500 mg total) by mouth 3 (three) times daily.   30 capsule   0   . diazepam (VALIUM) 2 MG tablet   Oral   Take 1 tablet (2 mg total) by mouth every 8 (eight) hours as needed for anxiety or muscle spasms.   5 tablet   0   . fexofenadine-pseudoephedrine (ALLEGRA-D) 60-120 MG 12 hr tablet   Oral   Take 1 tablet by mouth 2 (two) times daily.   20 tablet   0   . lurasidone (LATUDA) 40 MG TABS tablet   Oral   Take 40 mg by mouth daily.         . traMADol (ULTRAM) 50 MG tablet   Oral   Take 1 tablet (50 mg total) by mouth every 6 (six) hours as needed.   20 tablet   0     Allergies Trileptal and Viibryd  No family history on file.  Social History Social History  Substance Use Topics  . Smoking status: Current Every Day Smoker -- 0.50 packs/day    Types: Cigarettes  . Smokeless  tobacco: None  . Alcohol Use: No    Review of Systems Constitutional: No fever/chills Eyes: No visual changes. ENT: No sore throat. Left ear and left upper dental pain Cardiovascular: Denies chest pain. Respiratory: Denies shortness of breath. Gastrointestinal: No abdominal pain.  No nausea, no vomiting.  No diarrhea.  No constipation. Genitourinary: Negative for dysuria. Musculoskeletal: Negative for back pain. Skin: Negative for rash. Neurological: Negative for headaches, focal weakness or numbness. Psychiatric:Anxiety, depression, and bipolar. Allergic/Immunilogical: See medication list ____________________________________________   PHYSICAL EXAM:  VITAL SIGNS: ED Triage Vitals  Enc Vitals Group     BP 06/28/16 1407 141/79 mmHg     Pulse Rate 06/28/16 1407 93     Resp 06/28/16 1407 20     Temp 06/28/16 1407 98.2 F (36.8 C)     Temp Source 06/28/16 1407 Oral     SpO2 06/28/16 1407 97 %     Weight 06/28/16 1407 295 lb (133.811 kg)     Height 06/28/16 1407  (1.549 m)     Head Cir --      Peak Flow --      Pain Score 06/28/16 1411 8     Pain Loc --  Pain Edu? --      Excl. in GC? --     Constitutional: Alert and oriented. Well appearing and in no acute distress. Eyes: Conjunctivae are normal. PERRL. EOMI. Head: Atraumatic. Nose: No congestion/rhinnorhea. Mouth/Throat: Mucous membranes are moist.  Oropharynx non-erythematous. Neck: No stridor.  No cervical spine tenderness to palpation. Hematological/Lymphatic/Immunilogical: No cervical lymphadenopathy. Cardiovascular: Normal rate, regular rhythm. Grossly normal heart sounds.  Good peripheral circulation. Respiratory: Normal respiratory effort.  No retractions. Lungs CTAB. Gastrointestinal: Soft and nontender. No distention. No abdominal bruits. No CVA tenderness. Musculoskeletal: No lower extremity tenderness nor edema.  No joint effusions. Neurologic:  Normal speech and language. No gross focal  neurologic deficits are appreciated. No gait instability. Skin:  Skin is warm, dry and intact. No rash noted. Psychiatric: Mood and affect are normal. Speech and behavior are normal.  ____________________________________________   LABS (all labs ordered are listed, but only abnormal results are displayed)  Labs Reviewed - No data to display ____________________________________________  EKG   ____________________________________________  RADIOLOGY  No acute findings on CT ____________________________________________   PROCEDURES  Procedure(s) performed: None  Procedures  Critical Care performed: No  ____________________________________________   INITIAL IMPRESSION / ASSESSMENT AND PLAN / ED COURSE  Pertinent labs & imaging results that were available during my care of the patient were reviewed by me and considered in my medical decision making (see chart for details).  Sinus headache. Patient given discharge Instructions. Patient given a prescription for Allegra-D, tramadol, and amoxicillin. Patient advised follow-up family doctor no improvement in one week. ____________________________________________   FINAL CLINICAL IMPRESSION(S) / ED DIAGNOSES  Final diagnoses:  Sinus headache      NEW MEDICATIONS STARTED DURING THIS VISIT:  New Prescriptions   AMOXICILLIN (AMOXIL) 500 MG CAPSULE    Take 1 capsule (500 mg total) by mouth 3 (three) times daily.   FEXOFENADINE-PSEUDOEPHEDRINE (ALLEGRA-D) 60-120 MG 12 HR TABLET    Take 1 tablet by mouth 2 (two) times daily.   TRAMADOL (ULTRAM) 50 MG TABLET    Take 1 tablet (50 mg total) by mouth every 6 (six) hours as needed.     Note:  This document was prepared using Dragon voice recognition software and may include unintentional dictation errors.    Joni ReiningRonald K Smith, PA-C 06/28/16 1800  Myrna Blazeravid Matthew Schaevitz, MD 06/28/16 [phone]

## 2016-06-28 NOTE — ED Notes (Signed)
Pt reports left ear pain yesterday and headache for 2 weeks. Pain radiates to left jaw and teeth.

## 2016-11-26 ENCOUNTER — Emergency Department
Admission: EM | Admit: 2016-11-26 | Discharge: 2016-11-26 | Disposition: A | Discharge status: Home / Self Care | Payer: Self-pay | Attending: Emergency Medicine | Admitting: Emergency Medicine

## 2016-11-26 ENCOUNTER — Emergency Department: Payer: Self-pay

## 2016-11-26 DIAGNOSIS — O99331 Smoking (tobacco) complicating pregnancy, first trimester: Secondary | ICD-10-CM | POA: Insufficient documentation

## 2016-11-26 DIAGNOSIS — Z3A01 Less than 8 weeks gestation of pregnancy: Secondary | ICD-10-CM | POA: Insufficient documentation

## 2016-11-26 DIAGNOSIS — F17210 Nicotine dependence, cigarettes, uncomplicated: Secondary | ICD-10-CM | POA: Insufficient documentation

## 2016-11-26 DIAGNOSIS — O2341 Unspecified infection of urinary tract in pregnancy, first trimester: Secondary | ICD-10-CM | POA: Insufficient documentation

## 2016-11-26 LAB — COMPREHENSIVE METABOLIC PANEL
ALBUMIN: 3.8 g/dL (ref 3.5–5.0)
ALK PHOS: 64 U/L (ref 38–126)
ALT: 18 U/L (ref 14–54)
AST: 20 U/L (ref 15–41)
Anion gap: 6 (ref 5–15)
BUN: 9 mg/dL (ref 6–20)
CHLORIDE: 106 mmol/L (ref 101–111)
CO2: 25 mmol/L (ref 22–32)
CREATININE: 0.59 mg/dL (ref 0.44–1.00)
Calcium: 9.1 mg/dL (ref 8.9–10.3)
GFR calc Af Amer: >60 mL/min (ref >60)
GFR calc non Af Amer: >60 mL/min (ref >60)
GLUCOSE: 107 mg/dL — AB (ref 65–99)
Potassium: 4.6 mmol/L (ref 3.5–5.1)
SODIUM: 137 mmol/L (ref 135–145)
Total Bilirubin: 0.7 mg/dL (ref 0.3–1.2)
Total Protein: 7.3 g/dL (ref 6.5–8.1)

## 2016-11-26 LAB — HCG, QUANTITATIVE, PREGNANCY: HCG, BETA CHAIN, QUANT, S: 650 m[IU]/mL — AB (ref <5)

## 2016-11-26 LAB — URINALYSIS, COMPLETE (UACMP) WITH MICROSCOPIC
BACTERIA UA: NONE SEEN
BILIRUBIN URINE: NEGATIVE
Glucose, UA: NEGATIVE mg/dL
KETONES UR: NEGATIVE mg/dL
Nitrite: NEGATIVE
PH: 5 (ref 5.0–8.0)
Protein, ur: 30 mg/dL — AB
Specific Gravity, Urine: 1.016 (ref 1.005–1.030)

## 2016-11-26 LAB — POCT PREGNANCY, URINE: PREG TEST UR: POSITIVE — AB

## 2016-11-26 LAB — WET PREP, GENITAL
Clue Cells Wet Prep HPF POC: NONE SEEN
Sperm: NONE SEEN
Trich, Wet Prep: NONE SEEN
YEAST WET PREP: NONE SEEN

## 2016-11-26 LAB — CBC
HCT: 42.7 % (ref 35.0–47.0)
HEMOGLOBIN: 14.4 g/dL (ref 12.0–16.0)
MCH: 28.8 pg (ref 26.0–34.0)
MCHC: 33.7 g/dL (ref 32.0–36.0)
MCV: 85.6 fL (ref 80.0–100.0)
PLATELETS: 309 10*3/uL (ref 150–440)
RBC: 4.98 MIL/uL (ref 3.80–5.20)
RDW: 14.2 % (ref 11.5–14.5)
WBC: 13.9 10*3/uL — ABNORMAL HIGH (ref 3.6–11.0)

## 2016-11-26 LAB — CHLAMYDIA/NGC RT PCR (ARMC ONLY)
CHLAMYDIA TR: NOT DETECTED
N GONORRHOEAE: NOT DETECTED

## 2016-11-26 LAB — LIPASE, BLOOD: Lipase: 21 U/L (ref 11–51)

## 2016-11-26 MED ORDER — NITROFURANTOIN MONOHYD MACRO 100 MG PO CAPS
100.0000 mg | ORAL_CAPSULE | Freq: Once | ORAL | Status: AC
  Administered 2016-11-26: 100 mg via ORAL
  Filled 2016-11-26: qty 1

## 2016-11-26 MED ORDER — NITROFURANTOIN MONOHYD MACRO 100 MG PO CAPS: 100.0000 mg | ORAL_CAPSULE | Freq: Two times a day (BID) | ORAL | 0 refills | Status: AC

## 2016-11-26 MED ORDER — NITROFURANTOIN MONOHYD MACRO 100 MG PO CAPS
ORAL_CAPSULE | ORAL | Status: AC
  Administered 2016-11-26: 100 mg via ORAL
  Filled 2016-11-26: qty 1

## 2016-11-26 MED ORDER — PRENATAL 27-0.8 MG PO TABS: 1.0000 | ORAL_TABLET | Freq: Every day | ORAL | 0 refills | Status: DC

## 2016-11-26 NOTE — ED Triage Notes (Signed)
Lower abdominal pain X 2 weeks, stabbing. Pt states 10-15 days late for her period. Denies urinary sx. Pt alert and oriented X4, active, cooperative, pt in NAD. RR even and unlabored, color WNL.

## 2016-11-26 NOTE — Discharge Instructions (Signed)
Please make an appointment to see Dr. Jean RosenthalJackson in 2 days, and to have your bloodwork rechecked.  Return to the emergency department for severe pain, lightheadedness or fainting, fever, inability to keep down fluids, vaginal bleeding, or any other symptoms concerning to you.  You may take Tylenol for pain in pregnancy; please avoid all NSAID medications including Motrin, Aleve, Ibuprofen, and Advil.

## 2016-11-26 NOTE — ED Provider Notes (Signed)
[facility] Emergency Department Provider Note  ____________________________________________  Time seen: Approximately 5:20 PM  I have reviewed the triage vital signs and the nursing notes.   HISTORY  Chief Complaint Abdominal Pain    HPI Gina Farmer is a 30 y.o. female w/ morbid obesity, recurrent UTI, found out here she is pregnant, presenting w/ suprapubic pressure and dysuria for several days.  No fever, chills, n/v/d.  No change in vaginal discharge, no vaginal bleeding.  LMP 10/28.   Past Medical History:  Diagnosis Date  . Anxiety   . Back injury   . Bipolar 1 disorder (HCC)   . Depression     There are no active problems to display for this patient.   Past Surgical History:  Procedure Laterality Date  . APPENDECTOMY    . INTRAOCULAR LENS IMPLANT, SECONDARY      Current Outpatient Rx  . Order #: [id number] Class: Print  . Order #: [id number] Class: Print  . Order #: [id number] Class: Historical Med  . Order #: [id number] Class: Print  . Order #: [id number] Class: Print  . Order #: [id number] Class: Print    Allergies Trileptal [oxcarbazepine] and Viibryd [vilazodone hcl]  No family history on file.  Social History Social History  Substance Use Topics  . Smoking status: Current Every Day Smoker    Packs/day: 0.50    Types: Cigarettes  . Smokeless tobacco: Not on file  . Alcohol use No    Review of Systems Constitutional: No fever/chills.Headedness or syncope. Eyes: No visual changes. ENT: No sore throat. No congestion or rhinorrhea. Cardiovascular: Denies chest pain. Denies palpitations. Respiratory: Denies shortness of breath.  No cough. Gastrointestinal: As of suprapubic abdominal pain.  No nausea, no vomiting.  No diarrhea.  No constipation. Genitourinary: Positive for dysuria and increased urinary frequency. Missed period. Musculoskeletal: Negative for back pain. Skin: Negative for rash. Neurological: Negative for  headaches. No focal numbness, tingling or weakness.   10-point ROS otherwise negative.  ____________________________________________   PHYSICAL EXAM:  VITAL SIGNS: ED Triage Vitals  Enc Vitals Group     BP 11/26/16 1509 (!) 147/94     Pulse Rate 11/26/16 1509 (!) 106     Resp 11/26/16 1509 18     Temp 11/26/16 1509 98.6 F (37 C)     Temp Source 11/26/16 1509 Oral     SpO2 11/26/16 1509 98 %     Weight 11/26/16 1509 292 lb (132.5 kg)     Height 11/26/16 1509 5\' 1"  (1.549 m)     Head Circumference --      Peak Flow --      Pain Score 11/26/16 1510 8     Pain Loc --      Pain Edu? --      Excl. in GC? --     Constitutional: Alert and oriented. Well appearing and in no acute distress. Answers questions appropriately. Eyes: Conjunctivae are normal.  EOMI. No scleral icterus. Head: Atraumatic. Nose: No congestion/rhinnorhea. Mouth/Throat: Mucous membranes are moist.  Neck: No stridor.  Supple.   Cardiovascular: Normal rate, regular rhythm. No murmurs, rubs or gallops.  Respiratory: Normal respiratory effort.  No accessory muscle use or retractions. Lungs CTAB.  No wheezes, rales or ronchi. Gastrointestinal: Morbidly obese. Soft, and nondistended.  Mild tenderness in the supra pubic region. No guarding or rebound.  No peritoneal signs. Genitourinary: Normal-appearing external genitalia without lesions, generous tissue due to morbid obesity. Vaginal exam with white moderate physiologic discharge without blood, Unable to visualize  cervix due to incomplete insertion of the speculum given the patient's obesity and copious tissue without access to a larger speculum. Bimanual exam is negative for CMT, adnexal tenderness to palpation, no palpable masses. Unable to assess whether cervix is open or closed. Musculoskeletal: No LE edema. No ttp in the calves or palpable cords.  Negative Homan's sign. Neurologic:  A&Ox3.  Speech is clear.  Face and smile are symmetric.  EOMI.  Moves all  extremities well. Skin:  Skin is warm, dry and intact. No rash noted. Psychiatric: Mood and affect are normal. Speech and behavior are normal.  Normal judgement.  ____________________________________________   LABS (all labs ordered are listed, but only abnormal results are displayed)  Labs Reviewed  WET PREP, GENITAL - Abnormal; Notable for the following:       Result Value   WBC, Wet Prep HPF POC FEW (*)    All other components within normal limits  COMPREHENSIVE METABOLIC PANEL - Abnormal; Notable for the following:    Glucose, Bld 107 (*)    All other components within normal limits  CBC - Abnormal; Notable for the following:    WBC 13.9 (*)    All other components within normal limits  URINALYSIS, COMPLETE (UACMP) WITH MICROSCOPIC - Abnormal; Notable for the following:    Color, Urine YELLOW (*)    APPearance CLOUDY (*)    Hgb urine dipstick MODERATE (*)    Protein, ur 30 (*)    Leukocytes, UA LARGE (*)    Squamous Epithelial / LPF 6-30 (*)    All other components within normal limits  HCG, QUANTITATIVE, PREGNANCY - Abnormal; Notable for the following:    hCG, Beta Chain, Quant, S 650 (*)    All other components within normal limits  POCT PREGNANCY, URINE - Abnormal; Notable for the following:    Preg Test, Ur POSITIVE (*)    All other components within normal limits  CHLAMYDIA/NGC RT PCR ([facility] ONLY)  LIPASE, BLOOD  POC URINE PREG, ED   ____________________________________________  EKG  Not indicated ____________________________________________  RADIOLOGY  [place] Ob Comp Less 14 Wks  Result Date: 11/26/2016 CLINICAL DATA:  Lower pelvic pain EXAM: OBSTETRIC <14 WK US AND TRANSVAGINAL OB US TECHNIQUE: Both transabdominal and transvaginal ultrasound examinations were performed for complete evaluation of the gestation as well as the maternal uterus, adnexal regions, and pelvic cul-de-sac. Transvaginal technique was performed to assess early pregnancy.  COMPARISON:  None. FINDINGS: Intrauterine gestational sac: Not visualized Yolk sac:  Not visualized Embryo:  Not visualized Cardiac Activity: Not visualized Subchorionic hemorrhage:  None visualized. Maternal uterus/adnexae: Endometrial thickness is increased at 12.6 mm. Few tiny anechoic areas within the thickened endometrium. Bilateral ovaries are within normal limits. Right ovary measures 3.3 x 2.4 x 2.4 cm. Left ovary measures 2.1 x 2.1 x 2.1 cm. Trace free fluid in the pelvis. IMPRESSION: 1. Thickened endometrial stripe containing a few tiny anechoic cystic areas. There is no definitive gestational sac, yolk sac or fetal pole identified. Correlation with quantitative beta HCG is recommended with follow-up ultrasound as indicated. 2. Trace amount of free fluid in the pelvis Electronically Signed   By: Jasmine PangKim  Fujinaga M.D.   On: 11/26/2016 17:13   [place] Ob Transvaginal  Result Date: 11/26/2016 CLINICAL DATA:  Lower pelvic pain EXAM: OBSTETRIC <14 WK US AND TRANSVAGINAL OB US TECHNIQUE: Both transabdominal and transvaginal ultrasound examinations were performed for complete evaluation of the gestation as well as the maternal uterus, adnexal regions, and pelvic cul-de-sac. Transvaginal  technique was performed to assess early pregnancy. COMPARISON:  None. FINDINGS: Intrauterine gestational sac: Not visualized Yolk sac:  Not visualized Embryo:  Not visualized Cardiac Activity: Not visualized Subchorionic hemorrhage:  None visualized. Maternal uterus/adnexae: Endometrial thickness is increased at 12.6 mm. Few tiny anechoic areas within the thickened endometrium. Bilateral ovaries are within normal limits. Right ovary measures 3.3 x 2.4 x 2.4 cm. Left ovary measures 2.1 x 2.1 x 2.1 cm. Trace free fluid in the pelvis. IMPRESSION: 1. Thickened endometrial stripe containing a few tiny anechoic cystic areas. There is no definitive gestational sac, yolk sac or fetal pole identified. Correlation with quantitative beta HCG is  recommended with follow-up ultrasound as indicated. 2. Trace amount of free fluid in the pelvis Electronically Signed   By: Jasmine Pang M.D.   On: 11/26/2016 17:13    ____________________________________________   PROCEDURES  Procedure(s) performed: None  Procedures  Critical Care performed: No ____________________________________________   INITIAL IMPRESSION / ASSESSMENT AND PLAN / ED COURSE  Pertinent labs & imaging results that were available during my care of the patient were reviewed by me and considered in my medical decision making (see chart for details).  31 y.o. F who just found out she is pregnant presenting w/ lower abdominal cramping, dysuria.  The pt does have a UTI and we will start her on Macrobid.  Plan pelvic examination with cultures.  U/S shows thickened stripe w/o definite IUP, which is c/w hcg 650.  ----------------------------------------- 6:49 PM on 11/26/2016 -----------------------------------------  The patient's wet prep does not show any acute infection. At this time, the patient is stable for discharge. I spoke with Dr. Jean Rosenthal, the OB/GYN on call, who will see the patient in follow-up. Ectopic precautions and other return precautions were given.  ____________________________________________  FINAL CLINICAL IMPRESSION(S) / ED DIAGNOSES  Final diagnoses:  Urinary tract infection in mother during first trimester of pregnancy  Less than [redacted] weeks gestation of pregnancy    Clinical Course       NEW MEDICATIONS STARTED DURING THIS VISIT:  New Prescriptions   NITROFURANTOIN, MACROCRYSTAL-MONOHYDRATE, (MACROBID) 100 MG CAPSULE    Take 1 capsule (100 mg total) by mouth 2 (two) times daily.   PRENATAL VIT-FE FUMARATE-FA (MULTIVITAMIN-PRENATAL) 27-0.8 MG TABS TABLET    Take 1 tablet by mouth daily at 12 noon.      Rockne Menghini, MD 11/26/16 [phone]

## 2016-11-26 NOTE — ED Notes (Signed)
POC preg positive  

## 2016-12-03 ENCOUNTER — Emergency Department
Admission: EM | Admit: 2016-12-03 | Discharge: 2016-12-03 | Disposition: A | Discharge status: Home / Self Care | Payer: Medicaid Other | Attending: Emergency Medicine | Admitting: Emergency Medicine

## 2016-12-03 ENCOUNTER — Encounter: Payer: Self-pay | Admitting: Emergency Medicine

## 2016-12-03 ENCOUNTER — Emergency Department: Payer: Medicaid Other

## 2016-12-03 DIAGNOSIS — O26899 Other specified pregnancy related conditions, unspecified trimester: Secondary | ICD-10-CM

## 2016-12-03 DIAGNOSIS — O99331 Smoking (tobacco) complicating pregnancy, first trimester: Secondary | ICD-10-CM | POA: Insufficient documentation

## 2016-12-03 DIAGNOSIS — Z3A01 Less than 8 weeks gestation of pregnancy: Secondary | ICD-10-CM | POA: Insufficient documentation

## 2016-12-03 DIAGNOSIS — R102 Pelvic and perineal pain: Secondary | ICD-10-CM | POA: Insufficient documentation

## 2016-12-03 DIAGNOSIS — O26891 Other specified pregnancy related conditions, first trimester: Secondary | ICD-10-CM | POA: Insufficient documentation

## 2016-12-03 DIAGNOSIS — F17210 Nicotine dependence, cigarettes, uncomplicated: Secondary | ICD-10-CM | POA: Insufficient documentation

## 2016-12-03 LAB — URINALYSIS, COMPLETE (UACMP) WITH MICROSCOPIC
BILIRUBIN URINE: NEGATIVE
GLUCOSE, UA: NEGATIVE mg/dL
Ketones, ur: NEGATIVE mg/dL
NITRITE: NEGATIVE
PH: 6 (ref 5.0–8.0)
Protein, ur: NEGATIVE mg/dL
SPECIFIC GRAVITY, URINE: 1.018 (ref 1.005–1.030)

## 2016-12-03 LAB — CBC
HCT: 43.7 % (ref 35.0–47.0)
Hemoglobin: 14.4 g/dL (ref 12.0–16.0)
MCH: 29 pg (ref 26.0–34.0)
MCHC: 33 g/dL (ref 32.0–36.0)
MCV: 87.9 fL (ref 80.0–100.0)
PLATELETS: 283 10*3/uL (ref 150–440)
RBC: 4.97 MIL/uL (ref 3.80–5.20)
RDW: 14.4 % (ref 11.5–14.5)
WBC: 13.8 10*3/uL — AB (ref 3.6–11.0)

## 2016-12-03 LAB — COMPREHENSIVE METABOLIC PANEL
ALT: 19 U/L (ref 14–54)
AST: 18 U/L (ref 15–41)
Albumin: 3.9 g/dL (ref 3.5–5.0)
Alkaline Phosphatase: 53 U/L (ref 38–126)
Anion gap: 7 (ref 5–15)
BILIRUBIN TOTAL: 0.2 mg/dL — AB (ref 0.3–1.2)
BUN: 13 mg/dL (ref 6–20)
CO2: 24 mmol/L (ref 22–32)
CREATININE: 0.68 mg/dL (ref 0.44–1.00)
Calcium: 9 mg/dL (ref 8.9–10.3)
Chloride: 105 mmol/L (ref 101–111)
Glucose, Bld: 104 mg/dL — ABNORMAL HIGH (ref 65–99)
POTASSIUM: 4.5 mmol/L (ref 3.5–5.1)
Sodium: 136 mmol/L (ref 135–145)
TOTAL PROTEIN: 7.5 g/dL (ref 6.5–8.1)

## 2016-12-03 LAB — HCG, QUANTITATIVE, PREGNANCY: hCG, Beta Chain, Quant, S: 6274 m[IU]/mL — ABNORMAL HIGH (ref <5)

## 2016-12-03 MED ORDER — OXYCODONE-ACETAMINOPHEN 5-325 MG PO TABS: 1.0000 | ORAL_TABLET | Freq: Three times a day (TID) | ORAL | 0 refills | Status: DC | PRN

## 2016-12-03 NOTE — ED Triage Notes (Signed)
Patient to ER for c/o lower right sided abdominal pain. Patient states she was seen here on 12/14 for the same. Hcg was 650, but no gestational sac seen on Ultrasound. Has followed up with [facility], but unable to get US until Monday. Patient states pain is now severe.

## 2016-12-03 NOTE — ED Notes (Signed)
States RLQ abd cramping, states she found out she was pregnant last week but unsure how far along, states some vaginal spotting last night, pt awake and alert in no acute distress

## 2016-12-03 NOTE — ED Provider Notes (Signed)
[facility] Emergency Department Provider Note        Time seen: ----------------------------------------- 1:53 PM on 12/03/2016 -----------------------------------------    I have reviewed the triage vital signs and the nursing notes.   HISTORY  Chief Complaint Abdominal Pain    HPI Gina Farmer is a 30 y.o. female who presents to ER for abdominal pain. Patient states she was seen here approximately a week ago for the same. Her hCG is very low and ultrasound was performed which did not reveal a definite intrauterine pregnancy. [facility] but was unable to get an ultrasound until Monday. Patient states the pain is now severe in the right lower abdomen. Nothing makes it better or worse.   Past Medical History:  Diagnosis Date  . Anxiety   . Back injury   . Bipolar 1 disorder (HCC)   . Depression     There are no active problems to display for this patient.   Past Surgical History:  Procedure Laterality Date  . APPENDECTOMY    . INTRAOCULAR LENS IMPLANT, SECONDARY      Allergies Trileptal [oxcarbazepine] and Viibryd [vilazodone hcl]  Social History Social History  Substance Use Topics  . Smoking status: Current Every Day Smoker    Packs/day: 0.50    Types: Cigarettes  . Smokeless tobacco: Never Used  . Alcohol use No    Review of Systems Constitutional: Negative for fever. Cardiovascular: Negative for chest pain. Respiratory: Negative for shortness of breath. Gastrointestinal: Positive for abdominal pain Genitourinary: Negative for dysuria. Musculoskeletal: Negative for back pain. Skin: Negative for rash. Neurological: Negative for headaches, focal weakness or numbness.  10-point ROS otherwise negative.  ____________________________________________   PHYSICAL EXAM:  VITAL SIGNS: ED Triage Vitals [12/03/16 1135]  Enc Vitals Group     BP 126/62     Pulse Rate 94     Resp 18     Temp 97.9 F  (36.6 C)     Temp Source Oral     SpO2      Weight 290 lb (131.5 kg)     Height 5' 1.5" (1.562 m)     Head Circumference      Peak Flow      Pain Score 9     Pain Loc      Pain Edu?      Excl. in GC?     Constitutional: Alert and oriented. Well appearing and in no distress. Eyes: Conjunctivae are normal. PERRL. Normal extraocular movements. ENT   Head: Normocephalic and atraumatic.   Nose: No congestion/rhinnorhea.   Mouth/Throat: Mucous membranes are moist.   Neck: No stridor. Cardiovascular: Normal rate, regular rhythm. No murmurs, rubs, or gallops. Respiratory: Normal respiratory effort without tachypnea nor retractions. Breath sounds are clear and equal bilaterally. No wheezes/rales/rhonchi. Gastrointestinal: Right lower quadrant tenderness, no rebound or guarding. Normal bowel sounds. Musculoskeletal: Nontender with normal range of motion in all extremities. No lower extremity tenderness nor edema. Neurologic:  Normal speech and language. No gross focal neurologic deficits are appreciated.  Skin:  Skin is warm, dry and intact. No rash noted. Psychiatric: Mood and affect are normal. Speech and behavior are normal.  ____________________________________________  ED COURSE:  Pertinent labs & imaging results that were available during my care of the patient were reviewed by me and considered in my medical decision making (see chart for details). Clinical Course     Procedures ____________________________________________   LABS (pertinent positives/negatives)  Labs Reviewed  COMPREHENSIVE  METABOLIC PANEL - Abnormal; Notable for the following:       Result Value   Glucose, Bld 104 (*)    Total Bilirubin 0.2 (*)    All other components within normal limits  CBC - Abnormal; Notable for the following:    WBC 13.8 (*)    All other components within normal limits  URINALYSIS, COMPLETE (UACMP) WITH MICROSCOPIC - Abnormal; Notable for the following:    Color,  Urine YELLOW (*)    APPearance HAZY (*)    Hgb urine dipstick MODERATE (*)    Leukocytes, UA TRACE (*)    Bacteria, UA RARE (*)    Squamous Epithelial / LPF 6-30 (*)    All other components within normal limits  HCG, QUANTITATIVE, PREGNANCY - Abnormal; Notable for the following:    hCG, Beta Chain, Quant, S 6,274 (*)    All other components within normal limits    RADIOLOGY  Pregnancy ultrasound IMPRESSION: 1. Single intrauterine gestational sac with visible yolk sac. The embryo is not yet visible. Mean sac diameter 0.8 cm compatible with 5 weeks 4 days gestation. 2. Expected flow signal in both ovaries without additional significant abnormality observed. 3. The right ovary is larger volumetric leave in the left but not abnormally so. Possibility of an isoechoic corpus luteum of pregnancy in the right ovary is raised. ____________________________________________  FINAL ASSESSMENT AND PLAN  Pelvic pain  Plan: Patient with labs and imaging as dictated above. No clear etiology for her pelvic pain. 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 I will supply a short supply pain medicine. She is stable for outpatient follow-up.   Emily FilbertWilliams, Terryon Pineiro E, MD   Note: This dictation was prepared with Dragon dictation. Any transcriptional errors that result from this process are unintentional    Emily FilbertJonathan E Keilyn Haggard, MD 12/03/16 1520

## 2017-05-25 ENCOUNTER — Encounter: Payer: Self-pay | Admitting: Emergency Medicine

## 2017-05-25 ENCOUNTER — Emergency Department: Payer: Self-pay

## 2017-05-25 ENCOUNTER — Emergency Department
Admission: EM | Admit: 2017-05-25 | Discharge: 2017-05-25 | Disposition: A | Discharge status: Home / Self Care | Payer: Self-pay | Attending: Emergency Medicine | Admitting: Emergency Medicine

## 2017-05-25 DIAGNOSIS — R1010 Upper abdominal pain, unspecified: Secondary | ICD-10-CM | POA: Insufficient documentation

## 2017-05-25 DIAGNOSIS — R112 Nausea with vomiting, unspecified: Secondary | ICD-10-CM | POA: Insufficient documentation

## 2017-05-25 DIAGNOSIS — K219 Gastro-esophageal reflux disease without esophagitis: Secondary | ICD-10-CM | POA: Insufficient documentation

## 2017-05-25 DIAGNOSIS — R079 Chest pain, unspecified: Secondary | ICD-10-CM | POA: Insufficient documentation

## 2017-05-25 DIAGNOSIS — Z79899 Other long term (current) drug therapy: Secondary | ICD-10-CM | POA: Insufficient documentation

## 2017-05-25 DIAGNOSIS — F17210 Nicotine dependence, cigarettes, uncomplicated: Secondary | ICD-10-CM | POA: Insufficient documentation

## 2017-05-25 DIAGNOSIS — Z961 Presence of intraocular lens: Secondary | ICD-10-CM | POA: Insufficient documentation

## 2017-05-25 HISTORY — DX: Morbid (severe) obesity due to excess calories: E66.01

## 2017-05-25 LAB — HEPATIC FUNCTION PANEL
ALK PHOS: 61 U/L (ref 38–126)
ALT: 17 U/L (ref 14–54)
AST: 19 U/L (ref 15–41)
Albumin: 3.6 g/dL (ref 3.5–5.0)
Total Bilirubin: 0.4 mg/dL (ref 0.3–1.2)
Total Protein: 7.1 g/dL (ref 6.5–8.1)

## 2017-05-25 LAB — BASIC METABOLIC PANEL
Anion gap: 5 (ref 5–15)
BUN: 12 mg/dL (ref 6–20)
CHLORIDE: 108 mmol/L (ref 101–111)
CO2: 25 mmol/L (ref 22–32)
Calcium: 8.8 mg/dL — ABNORMAL LOW (ref 8.9–10.3)
Creatinine, Ser: 0.78 mg/dL (ref 0.44–1.00)
GFR calc Af Amer: >60 mL/min (ref >60)
GFR calc non Af Amer: >60 mL/min (ref >60)
GLUCOSE: 93 mg/dL (ref 65–99)
POTASSIUM: 3.8 mmol/L (ref 3.5–5.1)
Sodium: 138 mmol/L (ref 135–145)

## 2017-05-25 LAB — CBC
HEMATOCRIT: 41.3 % (ref 35.0–47.0)
Hemoglobin: 14 g/dL (ref 12.0–16.0)
MCH: 28.7 pg (ref 26.0–34.0)
MCHC: 33.9 g/dL (ref 32.0–36.0)
MCV: 84.5 fL (ref 80.0–100.0)
Platelets: 277 10*3/uL (ref 150–440)
RBC: 4.89 MIL/uL (ref 3.80–5.20)
RDW: 13.6 % (ref 11.5–14.5)
WBC: 11.4 10*3/uL — ABNORMAL HIGH (ref 3.6–11.0)

## 2017-05-25 LAB — TROPONIN I: Troponin I: <0.03 ng/mL (ref <0.03)

## 2017-05-25 LAB — LIPASE, BLOOD: Lipase: 26 U/L (ref 11–51)

## 2017-05-25 MED ORDER — GI COCKTAIL ~~LOC~~
30.0000 mL | Freq: Once | ORAL | Status: AC
  Administered 2017-05-25: 30 mL via ORAL
  Filled 2017-05-25: qty 30

## 2017-05-25 MED ORDER — ONDANSETRON 4 MG PO TBDP
4.0000 mg | ORAL_TABLET | Freq: Once | ORAL | Status: AC
  Administered 2017-05-25: 4 mg via ORAL
  Filled 2017-05-25: qty 1

## 2017-05-25 MED ORDER — MORPHINE SULFATE (PF) 4 MG/ML IV SOLN
4.0000 mg | Freq: Once | INTRAVENOUS | Status: AC
  Administered 2017-05-25: 4 mg via INTRAMUSCULAR
  Filled 2017-05-25: qty 1

## 2017-05-25 MED ORDER — OMEPRAZOLE MAGNESIUM 20 MG PO TBEC: 20.0000 mg | DELAYED_RELEASE_TABLET | Freq: Every day | ORAL | 1 refills | Status: DC

## 2017-05-25 MED ORDER — ONDANSETRON HCL 4 MG PO TABS: ORAL_TABLET | ORAL | 0 refills | Status: DC

## 2017-05-25 NOTE — ED Triage Notes (Signed)
Pt to ed with c/o upper abd pain and chest pain intermittently over the last 2 days, also reports pain in right shoulder, n/v, and "a knot" in her upper abd.

## 2017-05-25 NOTE — ED Notes (Signed)
Lab notified to add on hepatic function.

## 2017-05-25 NOTE — Discharge Instructions (Signed)
We believe your symptoms are a result of GERD (acid reflux).  Please read through the included information and follow up with your regular doctor.  In the meantime, we encourage you to try an over-the-counter medication such as Prilosec OTC.  Give it at least a week at see if your symptoms improve. ° °Return to the Emergency Department with new or worsening symptoms that concern you. °

## 2017-05-25 NOTE — ED Provider Notes (Signed)
[facility] Emergency Department Provider Note  ____________________________________________   First MD Initiated Contact with Patient 05/25/17 1437     (approximate)  I have reviewed the triage vital signs and the nursing notes.   HISTORY  Chief Complaint Abdominal Pain and Chest Pain    HPI Gina Farmer is a 31 y.o. female with a medical history as listed below who presents for evaluation of acute onset upper abdominal pain with nausea and vomiting.She has felt the symptoms intermittently for about a week and she notes that they are always made worse when she eats fatty and greasy foods.  Usually it will go away within a few hours but the pain started last night after her meal and has not gone away yet.  She states it is a little bit better but it is still at least moderate if not severe and it was severe at its worst.  It feels like both a burning reflux type pain as well as sharp aching pain in the middle of her abdomen and radiating to the right side.  Nothing makes it better.  She has had multiple episodes of emesis including one earlier this morning.  She has not had anything to eat since she tried to eat a little bit for breakfast about 6 hours ago and she vomited since then.  She denies fever/chills, chest pain, shortness of breath, dysuria.   Past Medical History:  Diagnosis Date  . Anxiety   . Back injury   . Bipolar 1 disorder (HCC)   . Depression   . Morbid obesity (HCC)     There are no active problems to display for this patient.   Past Surgical History:  Procedure Laterality Date  . APPENDECTOMY    . INTRAOCULAR LENS IMPLANT, SECONDARY      Prior to Admission medications   Medication Sig Start Date End Date Taking? Authorizing Provider  amoxicillin (AMOXIL) 500 MG capsule Take 1 capsule (500 mg total) by mouth 3 (three) times daily. 06/28/16   Joni Reining, PA-C  fexofenadine-pseudoephedrine (ALLEGRA-D) 60-120 MG 12 hr tablet  Take 1 tablet by mouth 2 (two) times daily. 06/28/16   Joni Reining, PA-C  lurasidone (LATUDA) 40 MG TABS tablet Take 40 mg by mouth daily.    [provider]  omeprazole (PRILOSEC OTC) 20 MG tablet Take 1 tablet (20 mg total) by mouth daily. 05/25/17 05/25/18  Loleta Rose, MD  ondansetron (ZOFRAN) 4 MG tablet Take 1-2 tabs by mouth every 8 hours as needed for nausea/vomiting 05/25/17   Loleta Rose, MD  oxyCODONE-acetaminophen (PERCOCET) 5-325 MG tablet Take 1 tablet by mouth every 8 (eight) hours as needed for moderate pain or severe pain. 12/03/16   Emily Filbert, MD  Prenatal Vit-Fe Fumarate-FA (MULTIVITAMIN-PRENATAL) 27-0.8 MG TABS tablet Take 1 tablet by mouth daily at 12 noon. 11/26/16   Rockne Menghini, MD  traMADol (ULTRAM) 50 MG tablet Take 1 tablet (50 mg total) by mouth every 6 (six) hours as needed. 06/28/16 06/28/17  Joni Reining, PA-C    Allergies Trileptal [oxcarbazepine] and Elliot Cousin hcl]  History reviewed. No pertinent family history.  Social History Social History  Substance Use Topics  . Smoking status: Current Every Day Smoker    Packs/day: 0.50    Types: Cigarettes  . Smokeless tobacco: Never Used  . Alcohol use No    Review of Systems Constitutional: No fever/chills Eyes: No visual changes. ENT: No sore throat. Cardiovascular: Denies chest pain.  Respiratory: Denies shortness of breath. Gastrointestinal: Upper abdominal pain radiating to the right side of her upper abdomen with nausea and vomiting.  Occasional loose stools Genitourinary: Negative for dysuria. Musculoskeletal: Negative for neck pain.  Negative for back pain. Integumentary: Negative for rash. Neurological: Negative for headaches, focal weakness or numbness.   ____________________________________________   PHYSICAL EXAM:  VITAL SIGNS: ED Triage Vitals  Enc Vitals Group     BP 05/25/17 1206 128/64     Pulse Rate 05/25/17 1206 91     Resp 05/25/17  1206 18     Temp 05/25/17 1206 98.2 F (36.8 C)     Temp Source 05/25/17 1206 Oral     SpO2 05/25/17 1206 99 %     Weight 05/25/17 1206 134.7 kg (297 lb)     Height 05/25/17 1206 1.549 m (5\' 1" )     Head Circumference --      Peak Flow --      Pain Score 05/25/17 1204 8     Pain Loc --      Pain Edu? --      Excl. in GC? --     Constitutional: Alert and oriented. Well appearing and in no acute Distress although she does appear uncomfortable Eyes: Conjunctivae are normal.  Head: Atraumatic. Nose: No congestion/rhinnorhea. Mouth/Throat: Mucous membranes are moist. Neck: No stridor.  No meningeal signs.   Cardiovascular: Normal rate, regular rhythm. Good peripheral circulation. Grossly normal heart sounds. Respiratory: Normal respiratory effort.  No retractions. Lungs CTAB. Gastrointestinal: Obese.  Soft with severe tenderness to palpation of the epigastrium and right upper quadrant with positive Murphy sign.  No tenderness to palpation of the lower abdomen Musculoskeletal: No lower extremity tenderness nor edema. No gross deformities of extremities. Neurologic:  Normal speech and language. No gross focal neurologic deficits are appreciated.  Skin:  Skin is warm, dry and intact. No rash noted. Psychiatric: Mood and affect are normal. Speech and behavior are normal.  ____________________________________________   LABS (all labs ordered are listed, but only abnormal results are displayed)  Labs Reviewed  BASIC METABOLIC PANEL - Abnormal; Notable for the following:       Result Value   Calcium 8.8 (*)    All other components within normal limits  CBC - Abnormal; Notable for the following:    WBC 11.4 (*)    All other components within normal limits  HEPATIC FUNCTION PANEL - Abnormal; Notable for the following:    Bilirubin, Direct <0.1 (*)    All other components within normal limits  TROPONIN I  LIPASE, BLOOD   ____________________________________________  EKG  ED ECG  REPORT I, Iysha Mishkin, the attending physician, personally viewed and interpreted this ECG.  Date: 05/25/2017 EKG Time: 12:12 Rate: 84 Rhythm: normal sinus rhythm QRS Axis: normal Intervals: normal ST/T Wave abnormalities: normal Narrative Interpretation: unremarkable (artifact present in V6 but there is no evidence of acute ischemia  ____________________________________________  RADIOLOGY   Dg Chest 2 View  Result Date: 05/25/2017 CLINICAL DATA:  Chest pain EXAM: CHEST  2 VIEW COMPARISON:  12/11/2013 chest radiograph. FINDINGS: Stable cardiomediastinal silhouette with normal heart size. No pneumothorax. No pleural effusion. Lungs appear clear, with no acute consolidative airspace disease and no pulmonary edema. IMPRESSION: No active cardiopulmonary disease. Electronically Signed   By: Delbert Phenix M.D.   On: 05/25/2017 13:00   US Abdomen Limited Ruq  Result Date: 05/25/2017 CLINICAL DATA:  Epigastric abdominal pain with nausea and vomiting. EXAM: ULTRASOUND ABDOMEN LIMITED RIGHT  UPPER QUADRANT COMPARISON:  CT of 05/29/2010 FINDINGS: Gallbladder: 5 mm echogenic immobile nonshadowing focus in the gallbladder is most likely a polyp. No wall thickening or pericholecystic fluid. Sonographic Murphy's sign was not elicited. Common bile duct: Diameter: Normal, 3 mm. Liver: No focal lesion identified. Within normal limits in parenchymal echogenicity. IMPRESSION: 1.  No acute process or explanation for right upper quadrant pain. 2. Presumed polyp within the gallbladder at 5 mm. Per consensus criteria, this can be presumed benign and does not warrant followup. Electronically Signed   By: Jeronimo GreavesKyle  Talbot M.D.   On: 05/25/2017 15:46    ____________________________________________   PROCEDURES  Critical Care performed: No   Procedure(s) performed:   Procedures   ____________________________________________   INITIAL IMPRESSION / ASSESSMENT AND PLAN / ED COURSE  Pertinent labs & imaging  results that were available during my care of the patient were reviewed by me and considered in my medical decision making (see chart for details).  The patient's signs and symptoms are most consistent with gallbladder disease.  She is hemodynamically stable and her labs are unremarkable with a normal lipase.  Hepatic function panel was not ordered in triage I have added on.  The patient would prefer not to have an IV if we can avoid it start giving her an intramuscular injection of morphine and some Zofran ODT and we will reassess after the ultrasound.  She agrees with the plan.   Clinical Course as of May 25 1754  Tue May 25, 2017  1650 No acute findings on U/S.  Will reassess, anticipate d/c with GERD recommendations. US Abdomen Limited RUQ [CF]  1754 Patient felt better after GI cocktail and is comfortable with the plan for outpatient follow-up.  I gave my usual and customary return precautions.     [CF]    Clinical Course User Index [CF] Loleta RoseForbach, Donnell Beauchamp, MD    ____________________________________________  FINAL CLINICAL IMPRESSION(S) / ED DIAGNOSES  Final diagnoses:  Upper abdominal pain  Gastroesophageal reflux disease, esophagitis presence not specified     MEDICATIONS GIVEN DURING THIS VISIT:  Medications  morphine 4 MG/ML injection 4 mg (4 mg Intramuscular Given 05/25/17 1501)  ondansetron (ZOFRAN-ODT) disintegrating tablet 4 mg (4 mg Oral Given 05/25/17 1501)  gi cocktail (Maalox,Lidocaine,Donnatal) (30 mLs Oral Given 05/25/17 1717)     NEW OUTPATIENT MEDICATIONS STARTED DURING THIS VISIT:  New Prescriptions   OMEPRAZOLE (PRILOSEC OTC) 20 MG TABLET    Take 1 tablet (20 mg total) by mouth daily.   ONDANSETRON (ZOFRAN) 4 MG TABLET    Take 1-2 tabs by mouth every 8 hours as needed for nausea/vomiting    Modified Medications   No medications on file    Discontinued Medications   No medications on file     Note:  This document was prepared using Dragon voice  recognition software and may include unintentional dictation errors.    Loleta RoseForbach, Maryama Kuriakose, MD 05/25/17 1755

## 2017-05-25 NOTE — ED Notes (Signed)
Patient transported to Ultrasound 

## 2017-07-29 ENCOUNTER — Emergency Department
Admission: EM | Admit: 2017-07-29 | Discharge: 2017-07-29 | Disposition: A | Discharge status: Home / Self Care | Payer: Self-pay | Attending: Emergency Medicine | Admitting: Emergency Medicine

## 2017-07-29 DIAGNOSIS — F17210 Nicotine dependence, cigarettes, uncomplicated: Secondary | ICD-10-CM | POA: Insufficient documentation

## 2017-07-29 DIAGNOSIS — R21 Rash and other nonspecific skin eruption: Secondary | ICD-10-CM | POA: Insufficient documentation

## 2017-07-29 DIAGNOSIS — Z79899 Other long term (current) drug therapy: Secondary | ICD-10-CM | POA: Insufficient documentation

## 2017-07-29 MED ORDER — DOXYCYCLINE HYCLATE 100 MG PO TABS: 100.0000 mg | ORAL_TABLET | Freq: Two times a day (BID) | ORAL | 0 refills | Status: DC

## 2017-07-29 MED ORDER — DOXYCYCLINE HYCLATE 100 MG PO TABS
100.0000 mg | ORAL_TABLET | Freq: Once | ORAL | Status: AC
  Administered 2017-07-29: 100 mg via ORAL
  Filled 2017-07-29: qty 1

## 2017-07-29 MED ORDER — TRIAMCINOLONE ACETONIDE 0.1 % EX CREA: 1.0000 "application " | TOPICAL_CREAM | Freq: Four times a day (QID) | CUTANEOUS | 0 refills | Status: DC

## 2017-07-29 NOTE — ED Provider Notes (Signed)
[facility] Emergency Department Provider Note  ____________________________________________  Time seen: Approximately 10:03 PM  I have reviewed the triage vital signs and the nursing notes.   HISTORY  Chief Complaint Rash    HPI Gina Farmer is a 31 y.o. female Who presents emergency department complaining of rash and blisters to bilateral hands, bilateral feet, lower back. Patient reports that approximately a week before symptoms began, she found an engorged tick to her left leg. Patient reports that since then she has had some muscle aches, fatigue, rash. No targetoid lesions to tick bite site. No pain to the tick bite site. Patient has been using over-the-counter medications to include antifungals and steroid cream with no relief. Patient is a Scientist, forensichealth care worker. Patient denies any intraoral lesions. Patient denies headache, visual changes, neck pain or stiffness, chest pain, shortness of breath, abdominal pain, nausea or vomiting.   Past Medical History:  Diagnosis Date  . Anxiety   . Back injury   . Bipolar 1 disorder (HCC)   . Depression   . Morbid obesity (HCC)     There are no active problems to display for this patient.   Past Surgical History:  Procedure Laterality Date  . APPENDECTOMY    . INTRAOCULAR LENS IMPLANT, SECONDARY      Prior to Admission medications   Medication Sig Start Date End Date Taking? Authorizing Provider  amoxicillin (AMOXIL) 500 MG capsule Take 1 capsule (500 mg total) by mouth 3 (three) times daily. 06/28/16   Joni ReiningSmith, Ronald K, PA-C  doxycycline (VIBRA-TABS) 100 MG tablet Take 1 tablet (100 mg total) by mouth 2 (two) times daily. 07/29/17   Cuthriell, Delorise RoyalsJonathan D, PA-C  fexofenadine-pseudoephedrine (ALLEGRA-D) 60-120 MG 12 hr tablet Take 1 tablet by mouth 2 (two) times daily. 06/28/16   Joni ReiningSmith, Ronald K, PA-C  lurasidone (LATUDA) 40 MG TABS tablet Take 40 mg by mouth daily.    [provider]  omeprazole  (PRILOSEC OTC) 20 MG tablet Take 1 tablet (20 mg total) by mouth daily. 05/25/17 05/25/18  Loleta RoseForbach, Cory, MD  ondansetron (ZOFRAN) 4 MG tablet Take 1-2 tabs by mouth every 8 hours as needed for nausea/vomiting 05/25/17   Loleta RoseForbach, Cory, MD  oxyCODONE-acetaminophen (PERCOCET) 5-325 MG tablet Take 1 tablet by mouth every 8 (eight) hours as needed for moderate pain or severe pain. 12/03/16   Emily FilbertWilliams, Jonathan E, MD  Prenatal Vit-Fe Fumarate-FA (MULTIVITAMIN-PRENATAL) 27-0.8 MG TABS tablet Take 1 tablet by mouth daily at 12 noon. 11/26/16   Rockne MenghiniNorman, Anne-Caroline, MD  triamcinolone cream (KENALOG) 0.1 % Apply 1 application topically 4 (four) times daily. 07/29/17   Cuthriell, Delorise RoyalsJonathan D, PA-C    Allergies Trileptal [oxcarbazepine] and Viibryd [vilazodone hcl]  No family history on file.  Social History Social History  Substance Use Topics  . Smoking status: Current Every Day Smoker    Packs/day: 0.50    Types: Cigarettes  . Smokeless tobacco: Never Used  . Alcohol use No     Review of Systems  Constitutional: No fever/chills Eyes: No visual changes. No discharge ENT: No upper respiratory complaints.no intraoral lesions. Cardiovascular: no chest pain. Respiratory: no cough. No SOB. Gastrointestinal: No abdominal pain.  No nausea, no vomiting.  No diarrhea.  No constipation. Genitourinary: Negative for dysuria. No hematuria Musculoskeletal: Negative for musculoskeletal pain. Skin: Negative for rash, abrasions, lacerations, ecchymosis.rash and blistering lesions to bilateral hands, feet, lower back. Neurological: Negative for headaches, focal weakness or numbness. 10-point ROS otherwise negative.  ____________________________________________  PHYSICAL EXAM:  VITAL SIGNS: ED Triage Vitals  Enc Vitals Group     BP 07/29/17 2024 122/62     Pulse Rate 07/29/17 2024 100     Resp 07/29/17 2024 18     Temp 07/29/17 2024 98.1 F (36.7 C)     Temp Source 07/29/17 2024 Oral     SpO2  07/29/17 2024 98 %     Weight 07/29/17 2024 (!) 302 lb (137 kg)     Height 07/29/17 2024 5' 1.5" (1.562 m)     Head Circumference --      Peak Flow --      Pain Score 07/29/17 2023 6     Pain Loc --      Pain Edu? --      Excl. in GC? --      Constitutional: Alert and oriented. Well appearing and in no acute distress. Eyes: Conjunctivae are normal. PERRL. EOMI. Head: Atraumatic. ENT:      Ears:       Nose: No congestion/rhinnorhea.      Mouth/Throat: Mucous membranes are moist. No intraoral lesions. Neck: No stridor. Neck is supple with full range of motion Hematological/Lymphatic/Immunilogical: No cervical lymphadenopathy. Cardiovascular: Normal rate, regular rhythm. Normal S1 and S2.  Good peripheral circulation. Respiratory: Normal respiratory effort without tachypnea or retractions. Lungs CTAB. Good air entry to the bases with no decreased or absent breath sounds. Musculoskeletal: Full range of motion to all extremities. No gross deformities appreciated. Neurologic:  Normal speech and language. No gross focal neurologic deficits are appreciated.  Skin:  Skin is warm, dry and intact. No rash noted.erythematous macular lesions as well as scattered blisters noted to dorsal aspect of the lateral hands and bilateral feet. A few scattered macular lesions are noted to the lower back. No targetoid lesion to previous tick bite. No petechiae.no intraoral lesions noted. Psychiatric: Mood and affect are normal. Speech and behavior are normal. Patient exhibits appropriate insight and judgement.   ____________________________________________   LABS (all labs ordered are listed, but only abnormal results are displayed)  Labs Reviewed - No data to display ____________________________________________  EKG   ____________________________________________  RADIOLOGY   No results found.  ____________________________________________    PROCEDURES  Procedure(s) performed:     Procedures    Medications - No data to display   ____________________________________________   INITIAL IMPRESSION / ASSESSMENT AND PLAN / ED COURSE  Pertinent labs & imaging results that were available during my care of the patient were reviewed by me and considered in my medical decision making (see chart for details).  Review of the [place] CSRS was performed in accordance of the [facility] prior to dispensing any controlled drugs.     Patient's diagnosis is consistent with rash to the hands and feet. Rash appears consistent with hand-foot-and-mouth disease. However, patient has had a recent tick bite, and presentation of malaise, body aches, rash is consistent with [facility] spotted fever. At this time, patient will be treated empirically with doxycycline for possible [facility] spotted fever. If this is hand-foot-and-mouth disease, it is a self-limiting condition and will resolve with time. I feel at this time, while rash is most likely hand-foot-and-mouth disease, other symptoms, presentation  rash within timeframe of removal of an engorged tick is too consistent for RMSF to withhold antibiotic therapy.. Patient will be discharged home with prescriptions for doxycycline. Patient is to follow up with primary care as needed or otherwise directed. Patient is given ED precautions to return to  the ED for any worsening or new symptoms.     ____________________________________________  FINAL CLINICAL IMPRESSION(S) / ED DIAGNOSES  Final diagnoses:  Rash and nonspecific skin eruption      NEW MEDICATIONS STARTED DURING THIS VISIT:  New Prescriptions   DOXYCYCLINE (VIBRA-TABS) 100 MG TABLET    Take 1 tablet (100 mg total) by mouth 2 (two) times daily.   TRIAMCINOLONE CREAM (KENALOG) 0.1 %    Apply 1 application topically 4 (four) times daily.        This chart was dictated using voice recognition software/Dragon. Despite best efforts to proofread, errors can occur which can  change the meaning. Any change was purely unintentional.    Racheal Patches, PA-C 07/29/17 2212    Emily Filbert, MD 07/29/17 [id number]

## 2017-07-29 NOTE — ED Triage Notes (Signed)
Patient c/o generalized rash/blisters X 4 days.  Pt reports being bitten by tick approx 1 week ago.

## 2018-01-09 ENCOUNTER — Other Ambulatory Visit: Payer: Self-pay

## 2018-01-09 ENCOUNTER — Emergency Department
Admission: EM | Admit: 2018-01-09 | Discharge: 2018-01-09 | Disposition: A | Discharge status: Home / Self Care | Payer: Self-pay | Attending: Emergency Medicine | Admitting: Emergency Medicine

## 2018-01-09 ENCOUNTER — Encounter: Payer: Self-pay | Admitting: Emergency Medicine

## 2018-01-09 ENCOUNTER — Emergency Department: Payer: Self-pay

## 2018-01-09 DIAGNOSIS — Z79899 Other long term (current) drug therapy: Secondary | ICD-10-CM | POA: Insufficient documentation

## 2018-01-09 DIAGNOSIS — F17210 Nicotine dependence, cigarettes, uncomplicated: Secondary | ICD-10-CM | POA: Insufficient documentation

## 2018-01-09 DIAGNOSIS — M25572 Pain in left ankle and joints of left foot: Secondary | ICD-10-CM | POA: Insufficient documentation

## 2018-01-09 DIAGNOSIS — M79672 Pain in left foot: Secondary | ICD-10-CM

## 2018-01-09 MED ORDER — TRAMADOL HCL 50 MG PO TABS: 50.0000 mg | ORAL_TABLET | Freq: Four times a day (QID) | ORAL | 0 refills | Status: DC | PRN

## 2018-01-09 MED ORDER — MELOXICAM 15 MG PO TABS: 15.0000 mg | ORAL_TABLET | Freq: Every day | ORAL | 1 refills | Status: DC

## 2018-01-09 NOTE — ED Triage Notes (Signed)
Pt to the END via POV c/o left foot pain and swelling. Pt denies any injury to the area. Pt states that the pain extends into her lower leg. No redness noted.

## 2018-01-09 NOTE — ED Notes (Signed)
See triage note  Presents with pain to left foot for couple of days  States pain became worse yesterday and today  Pain goes from heel and moves across ankle  Unsure of injury

## 2018-01-09 NOTE — ED Provider Notes (Signed)
[facility] Emergency Department Provider Note ____________________________________________  Time seen: Approximately 6:34 PM  I have reviewed the triage vital signs and the nursing notes.   HISTORY  Chief Complaint Foot Pain    HPI Gina Farmer is a 32 y.o. female who presents to the emergency department for evaluation and treatment of left foot pain and swelling. She reports chronic foot pain with intermittent paresthesias, but states that today the pain has not eased with rest or ice. She denies specific injury, but states that she has worked long hours on her feet lately. Pain is in the foot and ankle and both worsen with flexion and attempt to bear weight.   Past Medical History:  Diagnosis Date  . Anxiety   . Back injury   . Bipolar 1 disorder (HCC)   . Depression   . Morbid obesity (HCC)     There are no active problems to display for this patient.   Past Surgical History:  Procedure Laterality Date  . APPENDECTOMY    . INTRAOCULAR LENS IMPLANT, SECONDARY      Prior to Admission medications   Medication Sig Start Date End Date Taking? Authorizing Provider  topiramate (TOPAMAX) 50 MG tablet Take 50 mg by mouth 2 (two) times daily.   Yes [provider]  amoxicillin (AMOXIL) 500 MG capsule Take 1 capsule (500 mg total) by mouth 3 (three) times daily. 06/28/16   Joni ReiningSmith, Ronald K, PA-C  doxycycline (VIBRA-TABS) 100 MG tablet Take 1 tablet (100 mg total) by mouth 2 (two) times daily. 07/29/17   Cuthriell, Delorise RoyalsJonathan D, PA-C  fexofenadine-pseudoephedrine (ALLEGRA-D) 60-120 MG 12 hr tablet Take 1 tablet by mouth 2 (two) times daily. 06/28/16   Joni ReiningSmith, Ronald K, PA-C  lurasidone (LATUDA) 40 MG TABS tablet Take 40 mg by mouth daily.    [provider]  meloxicam (MOBIC) 15 MG tablet Take 1 tablet (15 mg total) by mouth daily. 01/09/18   Derrill Bagnell, Rulon Eisenmengerari B, FNP  omeprazole (PRILOSEC OTC) 20 MG tablet Take 1 tablet (20 mg total) by mouth  daily. 05/25/17 05/25/18  Loleta RoseForbach, Cory, MD  ondansetron (ZOFRAN) 4 MG tablet Take 1-2 tabs by mouth every 8 hours as needed for nausea/vomiting 05/25/17   Loleta RoseForbach, Cory, MD  oxyCODONE-acetaminophen (PERCOCET) 5-325 MG tablet Take 1 tablet by mouth every 8 (eight) hours as needed for moderate pain or severe pain. 12/03/16   Emily FilbertWilliams, Jonathan E, MD  Prenatal Vit-Fe Fumarate-FA (MULTIVITAMIN-PRENATAL) 27-0.8 MG TABS tablet Take 1 tablet by mouth daily at 12 noon. 11/26/16   Rockne MenghiniNorman, Anne-Caroline, MD  traMADol (ULTRAM) 50 MG tablet Take 1 tablet (50 mg total) by mouth every 6 (six) hours as needed. 01/09/18   Tawyna Pellot, Rulon Eisenmengerari B, FNP  triamcinolone cream (KENALOG) 0.1 % Apply 1 application topically 4 (four) times daily. 07/29/17   Cuthriell, Delorise RoyalsJonathan D, PA-C    Allergies Trileptal [oxcarbazepine] and Viibryd [vilazodone hcl]  No family history on file.  Social History Social History   Tobacco Use  . Smoking status: Current Every Day Smoker    Packs/day: 0.50    Types: Cigarettes  . Smokeless tobacco: Never Used  Substance Use Topics  . Alcohol use: No  . Drug use: No    Review of Systems Constitutional: Negative for recent injury or illness. Cardiovascular: Negative for active bleeding. Respiratory: Negative for shortness of breath. Musculoskeletal: Positive for left foot and ankle pain. Skin: Positive for swelling of the right foot and ankle.  Neurological: Positive for paresthesias of  the right foot.  ____________________________________________   PHYSICAL EXAM:  VITAL SIGNS: ED Triage Vitals  Enc Vitals Group     BP 01/09/18 1815 (!) 131/96     Pulse Rate 01/09/18 1815 96     Resp 01/09/18 1815 16     Temp 01/09/18 1815 98 F (36.7 C)     Temp Source 01/09/18 1815 Oral     SpO2 01/09/18 1815 96 %     Weight 01/09/18 1814 297 lb (134.7 kg)     Height 01/09/18 1814 5\' 1"  (1.549 m)     Head Circumference --      Peak Flow --      Pain Score 01/09/18 1814 10     Pain Loc  --      Pain Edu? --      Excl. in GC? --     Constitutional: Alert and oriented. Well appearing and in no acute distress. Eyes: Conjunctivae are clear without discharge or drainage Head: Atraumatic Neck: Supple. Respiratory: Respirations even and unlabored. Musculoskeletal: Left foot and ankle appear mildly edematous without erythema. Performs plantarflexion without difficulty, but unable to dorsiflex due to pain. No bony tenderness on palpation over the metatarsal bones. Joint compartment is soft. No pain on exam of the heel. Neurologic: Sharp and dull sensation intact. Motor function intact.  Skin: Warm, dry, intact, and without erythema.  Psychiatric: Affect and behavior are appropriate.  ____________________________________________   LABS (all labs ordered are listed, but only abnormal results are displayed)  Labs Reviewed - No data to display ____________________________________________  RADIOLOGY  Images of the left foot and ankle are negative for acute bony abnormality per radiology.  The images were also reviewed by me. ____________________________________________   PROCEDURES  Procedures  ____________________________________________   INITIAL IMPRESSION / ASSESSMENT AND PLAN / ED COURSE  Gina Farmer is a 32 y.o. female who presents to the emergency department for treatment and evaluation of left foot pain.  Images and exam are both consistent for no bony abnormality.  She was placed in a Velcro ankle stirrup splint.  She was neurovascularly intact post application.  She is to follow-up with primary care or her orthopedist for symptoms that are not improving over the week.  She is to continue to rest ice, and elevation.  She was given prescriptions for meloxicam and tramadol.  She is to return to the emergency department for symptoms of change or worsen if she is unable to schedule an appointment.  Medications - No data to display  Pertinent labs & imaging  results that were available during my care of the patient were reviewed by me and considered in my medical decision making (see chart for details).  _________________________________________   FINAL CLINICAL IMPRESSION(S) / ED DIAGNOSES  Final diagnoses:  Acute left ankle pain  Acute foot pain, left    ED Discharge Orders        Ordered    meloxicam (MOBIC) 15 MG tablet  Daily     01/09/18 1947    traMADol (ULTRAM) 50 MG tablet  Every 6 hours PRN     01/09/18 1947       If controlled substance prescribed during this visit, 12 month history viewed on the [facility] prior to issuing an initial prescription for Schedule II or III opiod.    Chinita Pester, FNP 01/09/18 2010    Sharman Cheek, MD 01/09/18 2328

## 2018-02-02 ENCOUNTER — Emergency Department: Payer: Self-pay

## 2018-02-02 ENCOUNTER — Emergency Department
Admission: EM | Admit: 2018-02-02 | Discharge: 2018-02-02 | Disposition: A | Discharge status: Home / Self Care | Payer: Self-pay | Attending: Emergency Medicine | Admitting: Emergency Medicine

## 2018-02-02 ENCOUNTER — Other Ambulatory Visit: Payer: Self-pay

## 2018-02-02 DIAGNOSIS — M4726 Other spondylosis with radiculopathy, lumbar region: Secondary | ICD-10-CM | POA: Insufficient documentation

## 2018-02-02 DIAGNOSIS — M545 Low back pain: Secondary | ICD-10-CM | POA: Insufficient documentation

## 2018-02-02 DIAGNOSIS — Z79899 Other long term (current) drug therapy: Secondary | ICD-10-CM | POA: Insufficient documentation

## 2018-02-02 DIAGNOSIS — F17210 Nicotine dependence, cigarettes, uncomplicated: Secondary | ICD-10-CM | POA: Insufficient documentation

## 2018-02-02 LAB — COMPREHENSIVE METABOLIC PANEL
ALT: 23 U/L (ref 14–54)
AST: 21 U/L (ref 15–41)
Albumin: 3.8 g/dL (ref 3.5–5.0)
Alkaline Phosphatase: 72 U/L (ref 38–126)
Anion gap: 9 (ref 5–15)
BUN: 11 mg/dL (ref 6–20)
CHLORIDE: 108 mmol/L (ref 101–111)
CO2: 21 mmol/L — AB (ref 22–32)
Calcium: 8.8 mg/dL — ABNORMAL LOW (ref 8.9–10.3)
Creatinine, Ser: 0.8 mg/dL (ref 0.44–1.00)
GFR calc non Af Amer: >60 mL/min (ref >60)
Glucose, Bld: 115 mg/dL — ABNORMAL HIGH (ref 65–99)
POTASSIUM: 3.8 mmol/L (ref 3.5–5.1)
SODIUM: 138 mmol/L (ref 135–145)
Total Bilirubin: 0.8 mg/dL (ref 0.3–1.2)
Total Protein: 7.7 g/dL (ref 6.5–8.1)

## 2018-02-02 LAB — DIFFERENTIAL
BASOS ABS: 0.1 10*3/uL (ref 0–0.1)
BASOS PCT: 1 %
EOS ABS: 0.4 10*3/uL (ref 0–0.7)
Eosinophils Relative: 3 %
Lymphocytes Relative: 25 %
Lymphs Abs: 3.1 10*3/uL (ref 1.0–3.6)
MONOS PCT: 5 %
Monocytes Absolute: 0.7 10*3/uL (ref 0.2–0.9)
Neutro Abs: 8.3 10*3/uL — ABNORMAL HIGH (ref 1.4–6.5)
Neutrophils Relative %: 66 %

## 2018-02-02 LAB — CBC
HEMATOCRIT: 44.2 % (ref 35.0–47.0)
Hemoglobin: 14.7 g/dL (ref 12.0–16.0)
MCH: 28.4 pg (ref 26.0–34.0)
MCHC: 33.3 g/dL (ref 32.0–36.0)
MCV: 85.2 fL (ref 80.0–100.0)
PLATELETS: 315 10*3/uL (ref 150–440)
RBC: 5.18 MIL/uL (ref 3.80–5.20)
RDW: 14 % (ref 11.5–14.5)
WBC: 12.5 10*3/uL — AB (ref 3.6–11.0)

## 2018-02-02 LAB — POCT PREGNANCY, URINE: PREG TEST UR: NEGATIVE

## 2018-02-02 MED ORDER — ONDANSETRON HCL 4 MG/2ML IJ SOLN
4.0000 mg | Freq: Once | INTRAMUSCULAR | Status: AC
Start: 2018-02-02 — End: 2018-02-02
  Administered 2018-02-02: 4 mg via INTRAVENOUS
  Filled 2018-02-02: qty 2

## 2018-02-02 MED ORDER — DEXAMETHASONE SODIUM PHOSPHATE 10 MG/ML IJ SOLN
10.0000 mg | Freq: Once | INTRAMUSCULAR | Status: AC
  Administered 2018-02-02: 10 mg via INTRAVENOUS
  Filled 2018-02-02: qty 1

## 2018-02-02 MED ORDER — MORPHINE SULFATE (PF) 4 MG/ML IV SOLN
4.0000 mg | Freq: Once | INTRAVENOUS | Status: AC
  Administered 2018-02-02: 4 mg via INTRAVENOUS
  Filled 2018-02-02: qty 1

## 2018-02-02 NOTE — ED Notes (Signed)
Pt is speaking to MRI at this time.

## 2018-02-02 NOTE — ED Notes (Signed)
Pt was able to ambulate to the toilet in the room and empty her bladder. I did a bladder scan after she voided. There was less than 25 mls of urine in her bladder.

## 2018-02-02 NOTE — ED Notes (Signed)
Patient transported to MRI 

## 2018-02-02 NOTE — ED Triage Notes (Signed)
Pt arrives to ED with c/o of lower back pain and numbness in buttocks, R leg and groin. Pt states symptoms x 24 hours. Pt states hx of "SI joint problems". Tearful in triage. Alert, oriented, ambulatory. States painful to sit. States "shooting fire pain." states seen here recently for L ankle sprain.

## 2018-02-02 NOTE — ED Provider Notes (Addendum)
[facility] Emergency Department Provider Note  ____________________________________________  Time seen: Approximately 4:44 PM  I have reviewed the triage vital signs and the nursing notes.   HISTORY  Chief Complaint Back Pain and Numbness (groin, leg, buttocks )   HPI Gina Farmer is a 32 y.o. female with a history of obesity, chronic back pain, bipolarwho presents for evaluationof back pain. Patient reports that 2 days ago she started developing worsening lower back pain that she describes as a moderate constant burning sensation located across her lower back with a sharp component going down her right leg. Over the course of the last 2 days she has had progressively worsening numbness in her groin area and weakness in her right lower extremity. She has had 2 falls because her right leg just gave out on her. She is also complaining of 2 episodes of urinary incontinence. She denies any recent trauma, IV drug use, unintentional weight loss, history of cancer. 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 No prior h/o back surgeries.   Past Medical History:  Diagnosis Date  . Anxiety   . Back injury   . Bipolar 1 disorder (HCC)   . Depression   . Morbid obesity (HCC)     There are no active problems to display for this patient.   Past Surgical History:  Procedure Laterality Date  . APPENDECTOMY    . INTRAOCULAR LENS IMPLANT, SECONDARY      Prior to Admission medications   Medication Sig Start Date End Date Taking? Authorizing Provider  amoxicillin (AMOXIL) 500 MG capsule Take 1 capsule (500 mg total) by mouth 3 (three) times daily. Patient not taking: Reported on 02/02/2018 06/28/16   Joni ReiningSmith, Ronald K, PA-C  doxycycline (VIBRA-TABS) 100 MG tablet Take 1 tablet (100 mg total) by mouth 2 (two) times daily. Patient not taking: Reported on 02/02/2018 07/29/17   Cuthriell, Delorise RoyalsJonathan D, PA-C    fexofenadine-pseudoephedrine (ALLEGRA-D) 60-120 MG 12 hr tablet Take 1 tablet by mouth 2 (two) times daily. 06/28/16   Joni ReiningSmith, Ronald K, PA-C  lurasidone (LATUDA) 40 MG TABS tablet Take 40 mg by mouth daily.    [provider]  meloxicam (MOBIC) 15 MG tablet Take 1 tablet (15 mg total) by mouth daily. 01/09/18   Triplett, Rulon Eisenmengerari B, FNP  omeprazole (PRILOSEC OTC) 20 MG tablet Take 1 tablet (20 mg total) by mouth daily. 05/25/17 05/25/18  Loleta RoseForbach, Cory, MD  ondansetron (ZOFRAN) 4 MG tablet Take 1-2 tabs by mouth every 8 hours as needed for nausea/vomiting 05/25/17   Loleta RoseForbach, Cory, MD  oxyCODONE-acetaminophen (PERCOCET) 5-325 MG tablet Take 1 tablet by mouth every 8 (eight) hours as needed for moderate pain or severe pain. 12/03/16   Emily FilbertWilliams, Jonathan E, MD  Prenatal Vit-Fe Fumarate-FA (MULTIVITAMIN-PRENATAL) 27-0.8 MG TABS tablet Take 1 tablet by mouth daily at 12 noon. 11/26/16   Rockne MenghiniNorman, Anne-Caroline, MD  topiramate (TOPAMAX) 50 MG tablet Take 50 mg by mouth 2 (two) times daily.    [provider]  traMADol (ULTRAM) 50 MG tablet Take 1 tablet (50 mg total) by mouth every 6 (six) hours as needed. 01/09/18   Triplett, Rulon Eisenmengerari B, FNP  triamcinolone cream (KENALOG) 0.1 % Apply 1 application topically 4 (four) times daily. Patient not taking: Reported on 02/02/2018 07/29/17   Cuthriell, Delorise RoyalsJonathan D, PA-C    Allergies Trileptal [oxcarbazepine] and Elliot CousinViibryd [vilazodone hcl]  History reviewed. No  pertinent family history.  Social History Social History   Tobacco Use  . Smoking status: Current Every Day Smoker    Packs/day: 0.50    Types: Cigarettes  . Smokeless tobacco: Never Used  Substance Use Topics  . Alcohol use: No  . Drug use: No    Review of Systems  Constitutional: Negative for fever. Eyes: Negative for visual changes. ENT: Negative for sore throat. Neck: No neck pain  Cardiovascular: Negative for chest pain. Respiratory: Negative for shortness of  breath. Gastrointestinal: Negative for abdominal pain, vomiting or diarrhea. Genitourinary: Negative for dysuria. Musculoskeletal: + back pain. Skin: Negative for rash. Neurological: Negative for headaches. + groin numbness, RLE weakness, and urinary incontinence Psych: No SI or HI  ____________________________________________   PHYSICAL EXAM:  VITAL SIGNS: ED Triage Vitals  Enc Vitals Group     BP 02/02/18 1319 (!) 158/103     Pulse Rate 02/02/18 1319 (!) 114     Resp 02/02/18 1319 20     Temp 02/02/18 1319 98.3 F (36.8 C)     Temp Source 02/02/18 1319 Oral     SpO2 02/02/18 1319 96 %     Weight 02/02/18 1320 (!) 304 lb (137.9 kg)     Height 02/02/18 1320 5\' 1"  (1.549 m)     Head Circumference --      Peak Flow --      Pain Score 02/02/18 1319 10     Pain Loc --      Pain Edu? --      Excl. in GC? --     Constitutional: Alert and oriented. Well appearing and in no apparent distress. HEENT:      Head: Normocephalic and atraumatic.         Eyes: Conjunctivae are normal. Sclera is non-icteric.       Mouth/Throat: Mucous membranes are moist.       Neck: Supple with no signs of meningismus. Cardiovascular: Regular rate and rhythm. No murmurs, gallops, or rubs. 2+ symmetrical distal pulses are present in all extremities. No JVD. Respiratory: Normal respiratory effort. Lungs are clear to auscultation bilaterally. No wheezes, crackles, or rhonchi.  Gastrointestinal: Soft, non tender, and non distended with positive bowel sounds. No rebound or guarding. Musculoskeletal: Mild ttp over the lumbar spine midline. Nontender with normal range of motion in all extremities. No edema, cyanosis, or erythema of extremities. Neurologic: Normal speech and language. Normal strength of b/l LE, patient reports decreased sensation to touch on RLE, normal rectal tone. PVR 26. DTRs 2+ b/l patella Skin: Skin is warm, dry and intact. No rash noted. Psychiatric: Mood and affect are normal. Speech and  behavior are normal.  ____________________________________________   LABS (all labs ordered are listed, but only abnormal results are displayed)  Labs Reviewed  CBC - Abnormal; Notable for the following components:      Result Value   WBC 12.5 (*)    All other components within normal limits  DIFFERENTIAL - Abnormal; Notable for the following components:   Neutro Abs 8.3 (*)    All other components within normal limits  COMPREHENSIVE METABOLIC PANEL - Abnormal; Notable for the following components:   CO2 21 (*)    Glucose, Bld 115 (*)    Calcium 8.8 (*)    All other components within normal limits  POC URINE PREG, ED  POCT PREGNANCY, URINE   ____________________________________________  EKG  none  ____________________________________________  RADIOLOGY  I have personally reviewed the images performed during this visit  and I agree with the Radiologist's read.   Interpretation by Radiologist:  Mr Lumbar Spine Wo Contrast  Result Date: 02/02/2018 CLINICAL DATA:  32 y/o F; lower back pain with numbness in the buttocks, right leg, and groin for 24 hours. Cauda equina syndrome suspected. EXAM: MRI LUMBAR SPINE WITHOUT CONTRAST TECHNIQUE: Multiplanar, multisequence MR imaging of the lumbar spine was performed. No intravenous contrast was administered. COMPARISON:  None. FINDINGS: Segmentation:  Standard. Alignment:  Physiologic. Vertebrae:  No fracture, evidence of discitis, or bone lesion. Conus medullaris and cauda equina: Conus extends to the L1-2 level. Conus and cauda equina appear normal. Paraspinal and other soft tissues: Negative. Disc levels: L1-2: Minimal disc bulge. No significant foraminal or canal stenosis. L2-3: Disc desiccation and mild loss of disc space height. No significant foraminal or canal stenosis. L3-4: No significant disc displacement, foraminal stenosis, or canal stenosis. L4-5: Small disc bulge eccentric to the right. Mild right foraminal stenosis. No canal  stenosis. L5-S1: Disc desiccation and mild loss of disc space height. Minimal disc bulge. No significant foraminal or canal stenosis. IMPRESSION: 1. No acute osseous abnormality or malalignment. 2. Mild discogenic degenerative changes of lumbar spine. Mild right L4-5 foraminal stenosis. Otherwise no significant foraminal or canal stenosis. Electronically Signed   By: Mitzi Hansen M.D.   On: 02/02/2018 20:16   ____________________________________________   PROCEDURES  Procedure(s) performed: None Procedures Critical Care performed:  None ____________________________________________   INITIAL IMPRESSION / ASSESSMENT AND PLAN / ED COURSE  32 y.o. female with a history of obesity, chronic back pain, bipolarwho presents for evaluation of lower back pain associated with RLE numbness, groin numbness, and 2 episodes of urinary incontinence in the last 2 days. 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 Has mild tenderness to palpation on the midline of her lumbar spine, normal rectal tone, normal DTRs on b/l LE, normal strength of b/l LE. PVR 26. Will get MRI of the lumbar spine to rule out canal narrowing/ cauda equina. Decadron given.     _________________________ 8:17 PM on 02/02/2018 -----------------------------------------  MRI negative for cauda equina or any canal stenosis. Does show mild right L4 and L5 foraminal stenosis. Patient remains well appearing and neurologically intact. She is going to be discharged home at this time.   As part of my medical decision making, I reviewed the following data within the electronic MEDICAL RECORD NUMBER Nursing notes reviewed and incorporated, Labs reviewed , Imaging reviewed, Patient signed out to Dr. Alphonzo Lemmings, Notes from prior ED visits and [place] Controlled Substance Database    Pertinent labs & imaging results that were available during my care of the patient were reviewed by me  and considered in my medical decision making (see chart for details).    ____________________________________________   FINAL CLINICAL IMPRESSION(S) / ED DIAGNOSES  Final diagnoses:  Acute midline low back pain, with sciatica presence unspecified  Osteoarthritis of spine with radiculopathy, lumbar region      NEW MEDICATIONS STARTED DURING THIS VISIT:  ED Discharge Orders    None       Note:  This document was prepared using Dragon voice recognition software and may include unintentional dictation errors.    Nita Sickle, MD 02/02/18 2018    Nita Sickle, MD 02/02/18 2032

## 2018-07-18 ENCOUNTER — Emergency Department: Payer: Medicaid Other

## 2018-07-18 ENCOUNTER — Emergency Department
Admission: EM | Admit: 2018-07-18 | Discharge: 2018-07-18 | Disposition: A | Discharge status: Home / Self Care | Payer: Medicaid Other | Attending: Student in an Organized Health Care Education/Training Program | Admitting: Student in an Organized Health Care Education/Training Program

## 2018-07-18 ENCOUNTER — Other Ambulatory Visit: Payer: Self-pay

## 2018-07-18 ENCOUNTER — Encounter: Payer: Self-pay | Admitting: Emergency Medicine

## 2018-07-18 DIAGNOSIS — W1842XA Slipping, tripping and stumbling without falling due to stepping into hole or opening, initial encounter: Secondary | ICD-10-CM | POA: Insufficient documentation

## 2018-07-18 DIAGNOSIS — S93602A Unspecified sprain of left foot, initial encounter: Secondary | ICD-10-CM

## 2018-07-18 DIAGNOSIS — Y9301 Activity, walking, marching and hiking: Secondary | ICD-10-CM | POA: Insufficient documentation

## 2018-07-18 DIAGNOSIS — F17210 Nicotine dependence, cigarettes, uncomplicated: Secondary | ICD-10-CM | POA: Insufficient documentation

## 2018-07-18 DIAGNOSIS — Z79899 Other long term (current) drug therapy: Secondary | ICD-10-CM | POA: Insufficient documentation

## 2018-07-18 DIAGNOSIS — Y929 Unspecified place or not applicable: Secondary | ICD-10-CM | POA: Insufficient documentation

## 2018-07-18 DIAGNOSIS — Y999 Unspecified external cause status: Secondary | ICD-10-CM | POA: Insufficient documentation

## 2018-07-18 MED ORDER — KETOROLAC TROMETHAMINE 30 MG/ML IJ SOLN
30.0000 mg | Freq: Once | INTRAMUSCULAR | Status: AC
  Administered 2018-07-18: 30 mg via INTRAMUSCULAR
  Filled 2018-07-18: qty 1

## 2018-07-18 MED ORDER — KETOROLAC TROMETHAMINE 10 MG PO TABS: 10.0000 mg | ORAL_TABLET | Freq: Four times a day (QID) | ORAL | 0 refills | Status: DC | PRN

## 2018-07-18 NOTE — ED Triage Notes (Signed)
States she stepped in a hole  Developed pain to top of right foot

## 2018-07-18 NOTE — ED Provider Notes (Signed)
[facility] Emergency Department Provider Note  ____________________________________________  Time seen: Approximately 3:28 PM  I have reviewed the triage vital signs and the nursing notes.   HISTORY  Chief Complaint Foot Pain    HPI Gina Farmer is a 32 y.o. female that presents emergency department for evaluation of right foot pain today.  Patient got her foot caught in a hole and bent backwards.  She has been walking since injury. Pain is over the top of her foot.  She has injured this foot several times in the past.  No alleviating measures have been attempted.   Past Medical History:  Diagnosis Date  . Anxiety   . Back injury   . Bipolar 1 disorder (HCC)   . Depression   . Morbid obesity (HCC)     There are no active problems to display for this patient.   Past Surgical History:  Procedure Laterality Date  . APPENDECTOMY    . INTRAOCULAR LENS IMPLANT, SECONDARY      Prior to Admission medications   Medication Sig Start Date End Date Taking? Authorizing Provider  amphetamine-dextroamphetamine (ADDERALL) 20 MG tablet Take 20 mg by mouth daily.   Yes [provider]  ARIPiprazole (ABILIFY) 20 MG tablet Take 20 mg by mouth daily.   Yes [provider]  gabapentin (NEURONTIN) 100 MG capsule Take 100 mg by mouth 3 (three) times daily.   Yes [provider]  amoxicillin (AMOXIL) 500 MG capsule Take 1 capsule (500 mg total) by mouth 3 (three) times daily. Patient not taking: Reported on 02/02/2018 06/28/16   Joni Reining, PA-C  doxycycline (VIBRA-TABS) 100 MG tablet Take 1 tablet (100 mg total) by mouth 2 (two) times daily. Patient not taking: Reported on 02/02/2018 07/29/17   Cuthriell, Delorise Royals, PA-C  fexofenadine-pseudoephedrine (ALLEGRA-D) 60-120 MG 12 hr tablet Take 1 tablet by mouth 2 (two) times daily. 06/28/16   Joni Reining, PA-C  ketorolac (TORADOL) 10 MG tablet Take 1 tablet (10 mg total) by mouth every 6  (six) hours as needed. 07/18/18   Enid Derry, PA-C  lurasidone (LATUDA) 40 MG TABS tablet Take 40 mg by mouth daily.    [provider]  meloxicam (MOBIC) 15 MG tablet Take 1 tablet (15 mg total) by mouth daily. 01/09/18   Triplett, Rulon Eisenmenger B, FNP  omeprazole (PRILOSEC OTC) 20 MG tablet Take 1 tablet (20 mg total) by mouth daily. 05/25/17 05/25/18  Loleta Rose, MD  ondansetron (ZOFRAN) 4 MG tablet Take 1-2 tabs by mouth every 8 hours as needed for nausea/vomiting 05/25/17   Loleta Rose, MD  oxyCODONE-acetaminophen (PERCOCET) 5-325 MG tablet Take 1 tablet by mouth every 8 (eight) hours as needed for moderate pain or severe pain. 12/03/16   Emily Filbert, MD  Prenatal Vit-Fe Fumarate-FA (MULTIVITAMIN-PRENATAL) 27-0.8 MG TABS tablet Take 1 tablet by mouth daily at 12 noon. 11/26/16   Rockne Menghini, MD  topiramate (TOPAMAX) 50 MG tablet Take 50 mg by mouth 2 (two) times daily.    [provider]  traMADol (ULTRAM) 50 MG tablet Take 1 tablet (50 mg total) by mouth every 6 (six) hours as needed. 01/09/18   Triplett, Rulon Eisenmenger B, FNP  triamcinolone cream (KENALOG) 0.1 % Apply 1 application topically 4 (four) times daily. Patient not taking: Reported on 02/02/2018 07/29/17   Cuthriell, Delorise Royals, PA-C    Allergies Trileptal [oxcarbazepine] and Viibryd [vilazodone hcl]  No family history on file.  Social History Social History  Tobacco Use  . Smoking status: Current Every Day Smoker    Packs/day: 0.50    Types: Cigarettes  . Smokeless tobacco: Never Used  Substance Use Topics  . Alcohol use: No  . Drug use: No     Review of Systems  Gastrointestinal:  No nausea, no vomiting.  Musculoskeletal: Positive for foot pain.  Skin: Negative for rash, abrasions, lacerations, ecchymosis. Neurological: Negative for numbness or tingling   ____________________________________________   PHYSICAL EXAM:  VITAL SIGNS: ED Triage Vitals  Enc Vitals Group     BP 07/18/18  1521 120/69     Pulse Rate 07/18/18 1521 88     Resp 07/18/18 1521 18     Temp 07/18/18 1521 98 F (36.7 C)     Temp Source 07/18/18 1521 Oral     SpO2 07/18/18 1521 95 %     Weight 07/18/18 1525 300 lb (136.1 kg)     Height 07/18/18 1525 5\' 1"  (1.549 m)     Head Circumference --      Peak Flow --      Pain Score 07/18/18 1525 7     Pain Loc --      Pain Edu? --      Excl. in GC? --      Constitutional: Alert and oriented. Well appearing and in no acute distress. Eyes: Conjunctivae are normal. PERRL. EOMI. Head: Atraumatic. ENT:      Ears:      Nose: No congestion/rhinnorhea.      Mouth/Throat: Mucous membranes are moist.  Neck: No stridor.  Cardiovascular: Normal rate, regular rhythm.  Good peripheral circulation. Respiratory: Normal respiratory effort without tachypnea or retractions. Lungs CTAB. Good air entry to the bases with no decreased or absent breath sounds. Musculoskeletal: Full range of motion to all extremities. No gross deformities appreciated.  Tenderness to palpation over dorsal foot.  No ecchymosis or swelling. Neurologic:  Normal speech and language. No gross focal neurologic deficits are appreciated.  Skin:  Skin is warm, dry and intact. No rash noted. Psychiatric: Mood and affect are normal. Speech and behavior are normal. Patient exhibits appropriate insight and judgement.   ____________________________________________   LABS (all labs ordered are listed, but only abnormal results are displayed)  Labs Reviewed - No data to display ____________________________________________  EKG   ____________________________________________  RADIOLOGY Lexine BatonI, Sundee Garland, personally viewed and evaluated these images (plain radiographs) as part of my medical decision making, as well as reviewing the written report by the radiologist.  Dg Foot Complete Right  Result Date: 07/18/2018 CLINICAL DATA:  Twisted foot today.  Dorsal lateral pain. EXAM: RIGHT FOOT COMPLETE  - 3+ VIEW COMPARISON:  RIGHT ankle radiograph March 02, 2015 FINDINGS: No acute fracture deformity or dislocation. Old distal fibular fracture. Similar faint lucency through the calcaneus, possible vascular shadow. Small plantar calcaneal spur. No destructive bony lesions. Dorsal foot soft tissue swelling without subcutaneous gas or radiopaque foreign bodies. IMPRESSION: 1. Soft tissue swelling without acute osseous process. 2. 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 Electronically Signed   By: Awilda Metroourtnay  Bloomer M.D.   On: 07/18/2018 16:54    ____________________________________________    PROCEDURES  Procedure(s) performed:    Procedures    Medications  ketorolac (TORADOL) 30 MG/ML injection 30 mg (30 mg Intramuscular Given 07/18/18 1715)     ____________________________________________   INITIAL IMPRESSION / ASSESSMENT AND PLAN / ED COURSE  Pertinent labs & imaging results that were available during my care of the patient were  reviewed by me and considered in my medical decision making (see chart for details).  Review of the [place] CSRS was performed in accordance of the [facility] prior to dispensing any controlled drugs.   Patient's diagnosis is consistent with foot sprain.  Vital signs and exam are reassuring.  X-ray negative for acute bony abnormalities.  Chronic x-ray findings were discussed with patient.  Foot was ace wrapped.  Patient has a foot brace and crutches at home.  Patient will be discharged home with prescriptions for toradol. Patient is to follow up with podiatry as directed. Patient is given ED precautions to return to the ED for any worsening or new symptoms.     ____________________________________________  FINAL CLINICAL IMPRESSION(S) / ED DIAGNOSES  Final diagnoses:  Sprain of left foot, initial encounter      NEW MEDICATIONS STARTED DURING THIS VISIT:  ED Discharge Orders        Ordered    ketorolac (TORADOL) 10 MG tablet  Every 6 hours PRN      07/18/18 1743          This chart was dictated using voice recognition software/Dragon. Despite best efforts to proofread, errors can occur which can change the meaning. Any change was purely unintentional.    Enid Derry, PA-C 07/18/18 2314    Willy Eddy, MD 07/18/18 2328

## 2018-11-03 ENCOUNTER — Encounter: Payer: Self-pay | Admitting: Emergency Medicine

## 2018-11-03 ENCOUNTER — Emergency Department
Admission: EM | Admit: 2018-11-03 | Discharge: 2018-11-03 | Disposition: A | Discharge status: Home / Self Care | Payer: No Typology Code available for payment source | Attending: Emergency Medicine | Admitting: Emergency Medicine

## 2018-11-03 DIAGNOSIS — Y92410 Unspecified street and highway as the place of occurrence of the external cause: Secondary | ICD-10-CM | POA: Insufficient documentation

## 2018-11-03 DIAGNOSIS — F17210 Nicotine dependence, cigarettes, uncomplicated: Secondary | ICD-10-CM | POA: Diagnosis not present

## 2018-11-03 DIAGNOSIS — M542 Cervicalgia: Secondary | ICD-10-CM | POA: Insufficient documentation

## 2018-11-03 DIAGNOSIS — Y9389 Activity, other specified: Secondary | ICD-10-CM | POA: Insufficient documentation

## 2018-11-03 DIAGNOSIS — Z79899 Other long term (current) drug therapy: Secondary | ICD-10-CM | POA: Diagnosis not present

## 2018-11-03 DIAGNOSIS — M545 Low back pain: Secondary | ICD-10-CM | POA: Diagnosis not present

## 2018-11-03 DIAGNOSIS — Y999 Unspecified external cause status: Secondary | ICD-10-CM | POA: Diagnosis not present

## 2018-11-03 MED ORDER — KETOROLAC TROMETHAMINE 60 MG/2ML IM SOLN
60.0000 mg | Freq: Once | INTRAMUSCULAR | Status: AC
  Administered 2018-11-03: 60 mg via INTRAMUSCULAR
  Filled 2018-11-03: qty 2

## 2018-11-03 MED ORDER — ORPHENADRINE CITRATE 30 MG/ML IJ SOLN
60.0000 mg | Freq: Two times a day (BID) | INTRAMUSCULAR | Status: DC
  Administered 2018-11-03: 60 mg via INTRAMUSCULAR
  Filled 2018-11-03: qty 2

## 2018-11-03 MED ORDER — TRAMADOL HCL 50 MG PO TABS: 50.0000 mg | ORAL_TABLET | Freq: Four times a day (QID) | ORAL | 0 refills | Status: AC | PRN

## 2018-11-03 MED ORDER — CYCLOBENZAPRINE HCL 10 MG PO TABS: 10.0000 mg | ORAL_TABLET | Freq: Three times a day (TID) | ORAL | 0 refills | Status: DC | PRN

## 2018-11-03 MED ORDER — IBUPROFEN 800 MG PO TABS: 800.0000 mg | ORAL_TABLET | Freq: Three times a day (TID) | ORAL | 0 refills | Status: DC | PRN
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
## 2018-11-07 ENCOUNTER — Emergency Department
Admission: EM | Admit: 2018-11-07 | Discharge: 2018-11-07 | Disposition: A | Discharge status: Home / Self Care | Payer: No Typology Code available for payment source | Attending: Emergency Medicine | Admitting: Emergency Medicine

## 2018-11-07 ENCOUNTER — Encounter: Payer: Self-pay | Admitting: Emergency Medicine

## 2018-11-07 DIAGNOSIS — Y939 Activity, unspecified: Secondary | ICD-10-CM | POA: Diagnosis not present

## 2018-11-07 DIAGNOSIS — Z79899 Other long term (current) drug therapy: Secondary | ICD-10-CM | POA: Insufficient documentation

## 2018-11-07 DIAGNOSIS — Y92410 Unspecified street and highway as the place of occurrence of the external cause: Secondary | ICD-10-CM | POA: Insufficient documentation

## 2018-11-07 DIAGNOSIS — G8929 Other chronic pain: Secondary | ICD-10-CM

## 2018-11-07 DIAGNOSIS — F17210 Nicotine dependence, cigarettes, uncomplicated: Secondary | ICD-10-CM | POA: Diagnosis not present

## 2018-11-07 DIAGNOSIS — Y998 Other external cause status: Secondary | ICD-10-CM | POA: Insufficient documentation

## 2018-11-07 DIAGNOSIS — M5441 Lumbago with sciatica, right side: Secondary | ICD-10-CM | POA: Diagnosis not present

## 2018-11-07 DIAGNOSIS — M545 Low back pain: Secondary | ICD-10-CM | POA: Diagnosis present

## 2018-11-07 MED ORDER — DICLOFENAC SODIUM 50 MG PO TBEC: 50.0000 mg | DELAYED_RELEASE_TABLET | Freq: Two times a day (BID) | ORAL | 0 refills | Status: AC

## 2018-11-07 MED ORDER — KETOROLAC TROMETHAMINE 30 MG/ML IJ SOLN
30.0000 mg | Freq: Once | INTRAMUSCULAR | Status: AC
  Administered 2018-11-07: 30 mg via INTRAMUSCULAR
  Filled 2018-11-07: qty 1

## 2018-11-07 MED ORDER — PREDNISONE 20 MG PO TABS: 20.0000 mg | ORAL_TABLET | Freq: Two times a day (BID) | ORAL | 0 refills | Status: AC
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
## 2019-09-12 ENCOUNTER — Telehealth: Payer: Medicaid Other | Admitting: Physician Assistant

## 2019-09-12 DIAGNOSIS — J069 Acute upper respiratory infection, unspecified: Secondary | ICD-10-CM

## 2019-09-12 MED ORDER — BENZONATATE 100 MG PO CAPS: 100.0000 mg | ORAL_CAPSULE | Freq: Three times a day (TID) | ORAL | 0 refills | Status: DC | PRN

## 2019-09-12 MED ORDER — FLUTICASONE PROPIONATE 50 MCG/ACT NA SUSP: 2.0000 | Freq: Every day | NASAL | 6 refills | Status: DC

## 2019-09-12 NOTE — Progress Notes (Signed)
We are sorry you are not feeling well.  Here is how we plan to help! ° °Based on what you have shared with me, it looks like you may have a viral upper respiratory infection.  Upper respiratory infections are caused by a large number of viruses; however, rhinovirus is the most common cause.  ° °Symptoms vary from person to person, with common symptoms including sore throat, cough, fatigue or lack of energy and feeling of general discomfort.  A low-grade fever of up to 100.4 may present, but is often uncommon.  Symptoms vary however, and are closely related to a person's age or underlying illnesses.  The most common symptoms associated with an upper respiratory infection are nasal discharge or congestion, cough, sneezing, headache and pressure in the ears and face.  These symptoms usually persist for about 3 to 10 days, but can last up to 2 weeks.  It is important to know that upper respiratory infections do not cause serious illness or complications in most cases.   ° °Upper respiratory infections can be transmitted from person to person, with the most common method of transmission being a person's hands.  The virus is able to live on the skin and can infect other persons for up to 2 hours after direct contact.  Also, these can be transmitted when someone coughs or sneezes; thus, it is important to cover the mouth to reduce this risk.  To keep the spread of the illness at bay, good hand hygiene is very important. ° °This is an infection that is most likely caused by a virus. There are no specific treatments other than to help you with the symptoms until the infection runs its course.  We are sorry you are not feeling well.  Here is how we plan to help! ° ° °For nasal congestion, you may use an oral decongestants such as Mucinex D or if you have glaucoma or high blood pressure use plain Mucinex.  Saline nasal spray or nasal drops can help and can safely be used as often as needed for congestion.  For your congestion,  I have prescribed Fluticasone nasal spray one spray in each nostril twice a day ° °If you do not have a history of heart disease, hypertension, diabetes or thyroid disease, prostate/bladder issues or glaucoma, you may also use Sudafed to treat nasal congestion.  It is highly recommended that you consult with a pharmacist or your primary care physician to ensure this medication is safe for you to take.    ° °If you have a cough, you may use cough suppressants such as Delsym and Robitussin.  If you have glaucoma or high blood pressure, you can also use Coricidin HBP.   °For cough I have prescribed for you A prescription cough medication called Tessalon Perles 100 mg. You may take 1-2 capsules every 8 hours as needed for cough ° °If you have a sore or scratchy throat, use a saltwater gargle- ¼ to ½ teaspoon of salt dissolved in a 4-ounce to 8-ounce glass of warm water.  Gargle the solution for approximately 15-30 seconds and then spit.  It is important not to swallow the solution.  You can also use throat lozenges/cough drops and Chloraseptic spray to help with throat pain or discomfort.  Warm or cold liquids can also be helpful in relieving throat pain. ° °For headache, pain or general discomfort, you can use Ibuprofen or Tylenol as directed.   °Some authorities believe that zinc sprays or the use of   Echinacea may shorten the course of your symptoms. ° ° °HOME CARE °• Only take medications as instructed by your medical team. °• Be sure to drink plenty of fluids. Water is fine as well as fruit juices, sodas and electrolyte beverages. You may want to stay away from caffeine or alcohol. If you are nauseated, try taking small sips of liquids. How do you know if you are getting enough fluid? Your urine should be a pale yellow or almost colorless. °• Get rest. °• Taking a steamy shower or using a humidifier may help nasal congestion and ease sore throat pain. You can place a towel over your head and breathe in the steam  from hot water coming from a faucet. °• Using a saline nasal spray works much the same way. °• Cough drops, hard candies and sore throat lozenges may ease your cough. °• Avoid close contacts especially the very young and the elderly °• Cover your mouth if you cough or sneeze °• Always remember to wash your hands.  ° °GET HELP RIGHT AWAY IF: °• You develop worsening fever. °• If your symptoms do not improve within 10 days °• You develop yellow or green discharge from your nose over 3 days. °• You have coughing fits °• You develop a severe head ache or visual changes. °• You develop shortness of breath, difficulty breathing or start having chest pain °• Your symptoms persist after you have completed your treatment plan ° °MAKE SURE YOU  °· Understand these instructions. °· Will watch your condition. °· Will get help right away if you are not doing well or get worse. ° °Your e-visit answers were reviewed by a board certified advanced clinical practitioner to complete your personal care plan. Depending upon the condition, your plan could have included both over the counter or prescription medications. °Please review your pharmacy choice. If there is a problem, you may call our nursing hot line at and have the prescription routed to another pharmacy. °Your safety is important to us. If you have drug allergies check your prescription carefully.  ° °You can use MyChart to ask questions about today’s visit, request a non-urgent call back, or ask for a work or school excuse for 24 hours related to this e-Visit. If it has been greater than 24 hours you will need to follow up with your provider, or enter a new e-Visit to address those concerns. °You will get an e-mail in the next two days asking about your experience.  I hope that your e-visit has been valuable and will speed your recovery. Thank you for using e-visits. ° ° ° ° °Greater than 5 minutes, yet less than 10 minutes of time have been spent researching, coordinating  and implementing care for this patient today.  ° °

## 2019-09-13 ENCOUNTER — Encounter: Payer: Self-pay | Admitting: Emergency Medicine

## 2019-09-13 ENCOUNTER — Other Ambulatory Visit: Payer: Self-pay

## 2019-09-13 ENCOUNTER — Emergency Department
Admission: EM | Admit: 2019-09-13 | Discharge: 2019-09-13 | Disposition: A | Discharge status: Home / Self Care | Payer: Medicaid Other | Attending: Emergency Medicine | Admitting: Emergency Medicine

## 2019-09-13 DIAGNOSIS — F17210 Nicotine dependence, cigarettes, uncomplicated: Secondary | ICD-10-CM | POA: Insufficient documentation

## 2019-09-13 DIAGNOSIS — B9689 Other specified bacterial agents as the cause of diseases classified elsewhere: Secondary | ICD-10-CM

## 2019-09-13 DIAGNOSIS — R102 Pelvic and perineal pain: Secondary | ICD-10-CM | POA: Insufficient documentation

## 2019-09-13 DIAGNOSIS — N760 Acute vaginitis: Secondary | ICD-10-CM | POA: Insufficient documentation

## 2019-09-13 DIAGNOSIS — R319 Hematuria, unspecified: Secondary | ICD-10-CM

## 2019-09-13 DIAGNOSIS — J029 Acute pharyngitis, unspecified: Secondary | ICD-10-CM | POA: Insufficient documentation

## 2019-09-13 DIAGNOSIS — N390 Urinary tract infection, site not specified: Secondary | ICD-10-CM | POA: Insufficient documentation

## 2019-09-13 LAB — URINALYSIS, COMPLETE (UACMP) WITH MICROSCOPIC
Bilirubin Urine: NEGATIVE
Glucose, UA: NEGATIVE mg/dL
Ketones, ur: NEGATIVE mg/dL
Nitrite: POSITIVE — AB
Protein, ur: NEGATIVE mg/dL
Specific Gravity, Urine: 1.01 (ref 1.005–1.030)
pH: 7 (ref 5.0–8.0)

## 2019-09-13 LAB — WET PREP, GENITAL
Sperm: NONE SEEN
Trich, Wet Prep: NONE SEEN
Yeast Wet Prep HPF POC: NONE SEEN

## 2019-09-13 LAB — POCT PREGNANCY, URINE: Preg Test, Ur: NEGATIVE

## 2019-09-13 IMAGING — DX DG FOOT COMPLETE 3+V*L*
3 series · 3 of 3 positions shown · non-contrast
Comparison: 12/08/2012

CLINICAL DATA: Left foot pain and swelling.  No known injury.

EXAM:
LEFT FOOT - COMPLETE 3+ VIEW

[foot ap]
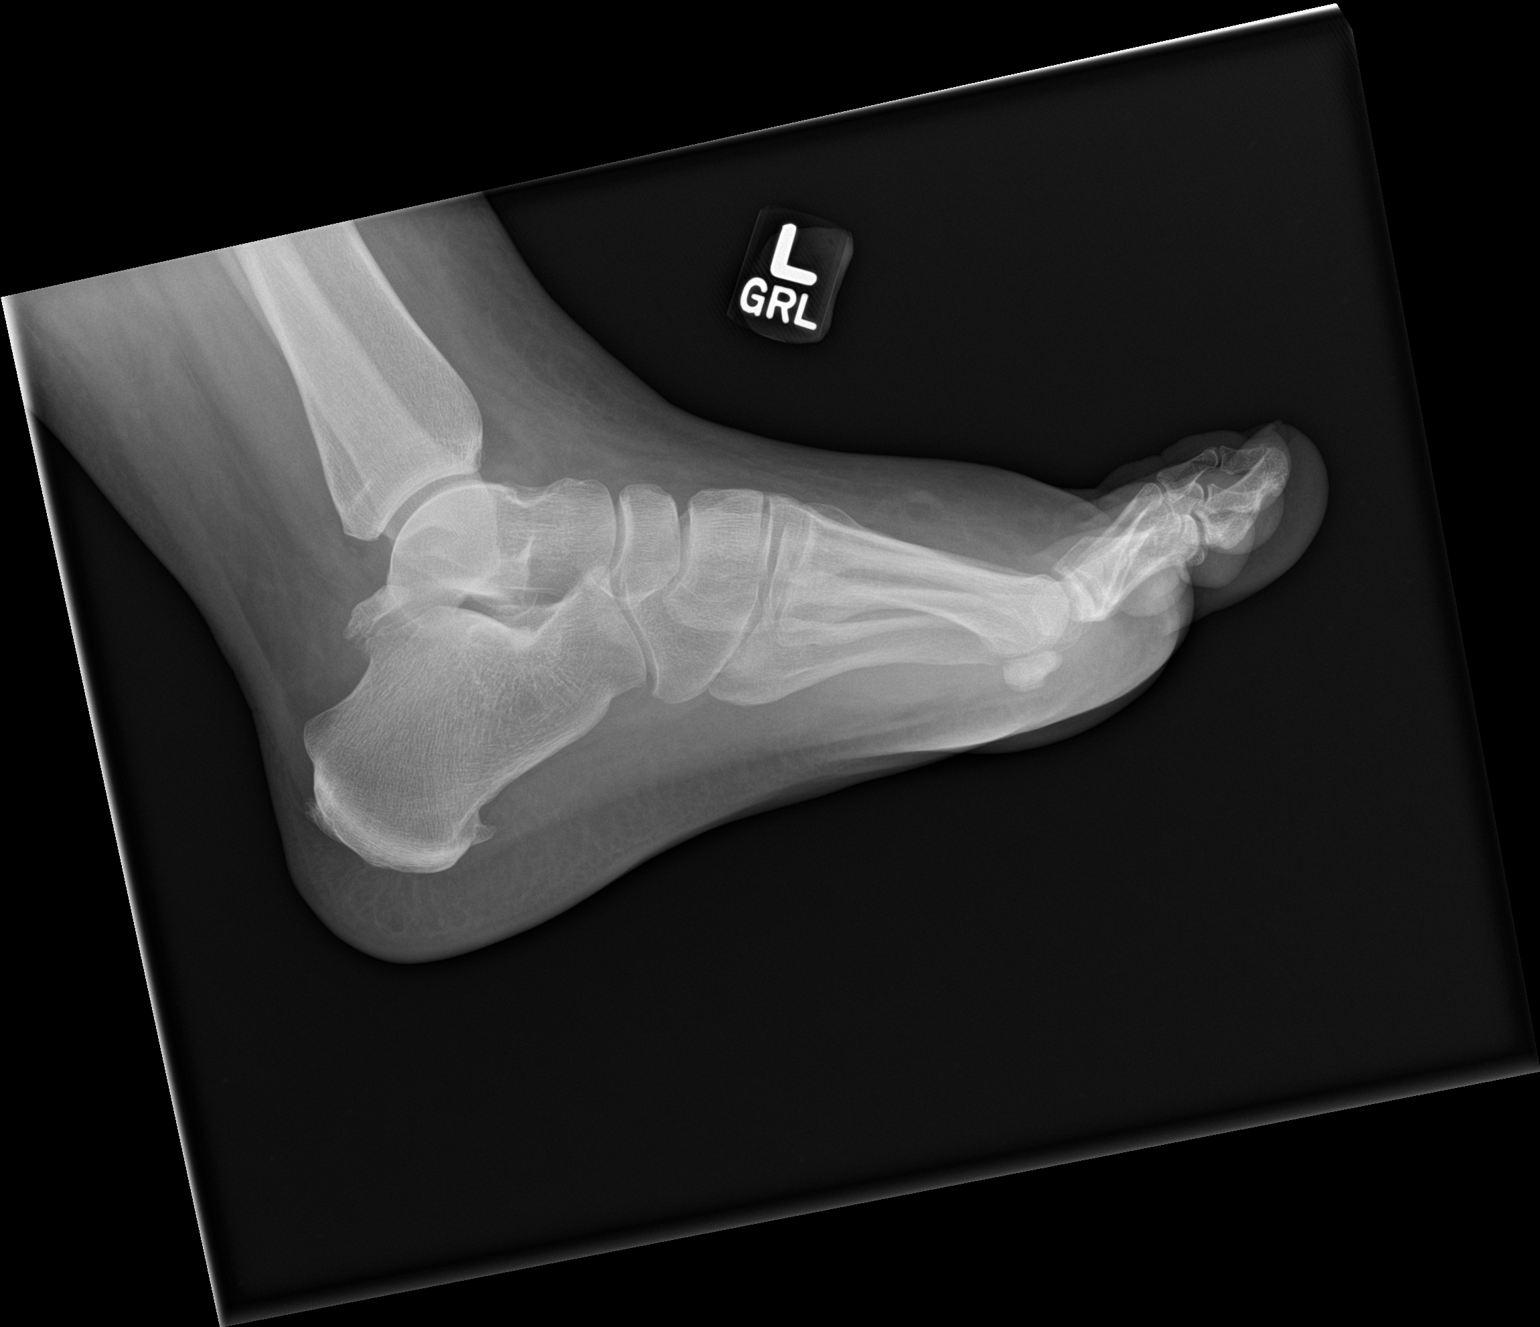

[foot obl]
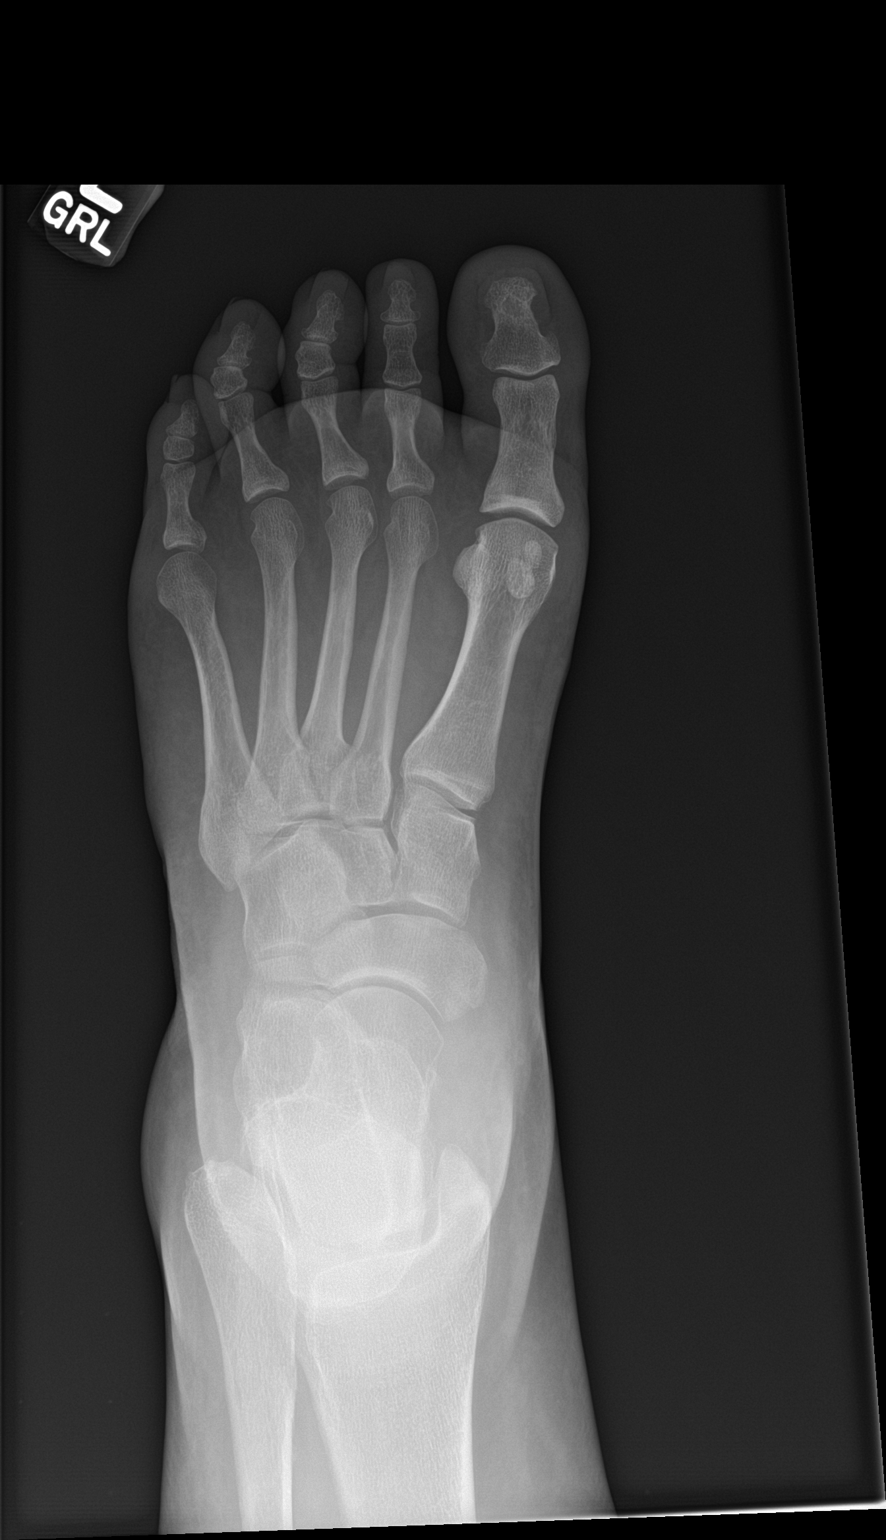

[foot lat]
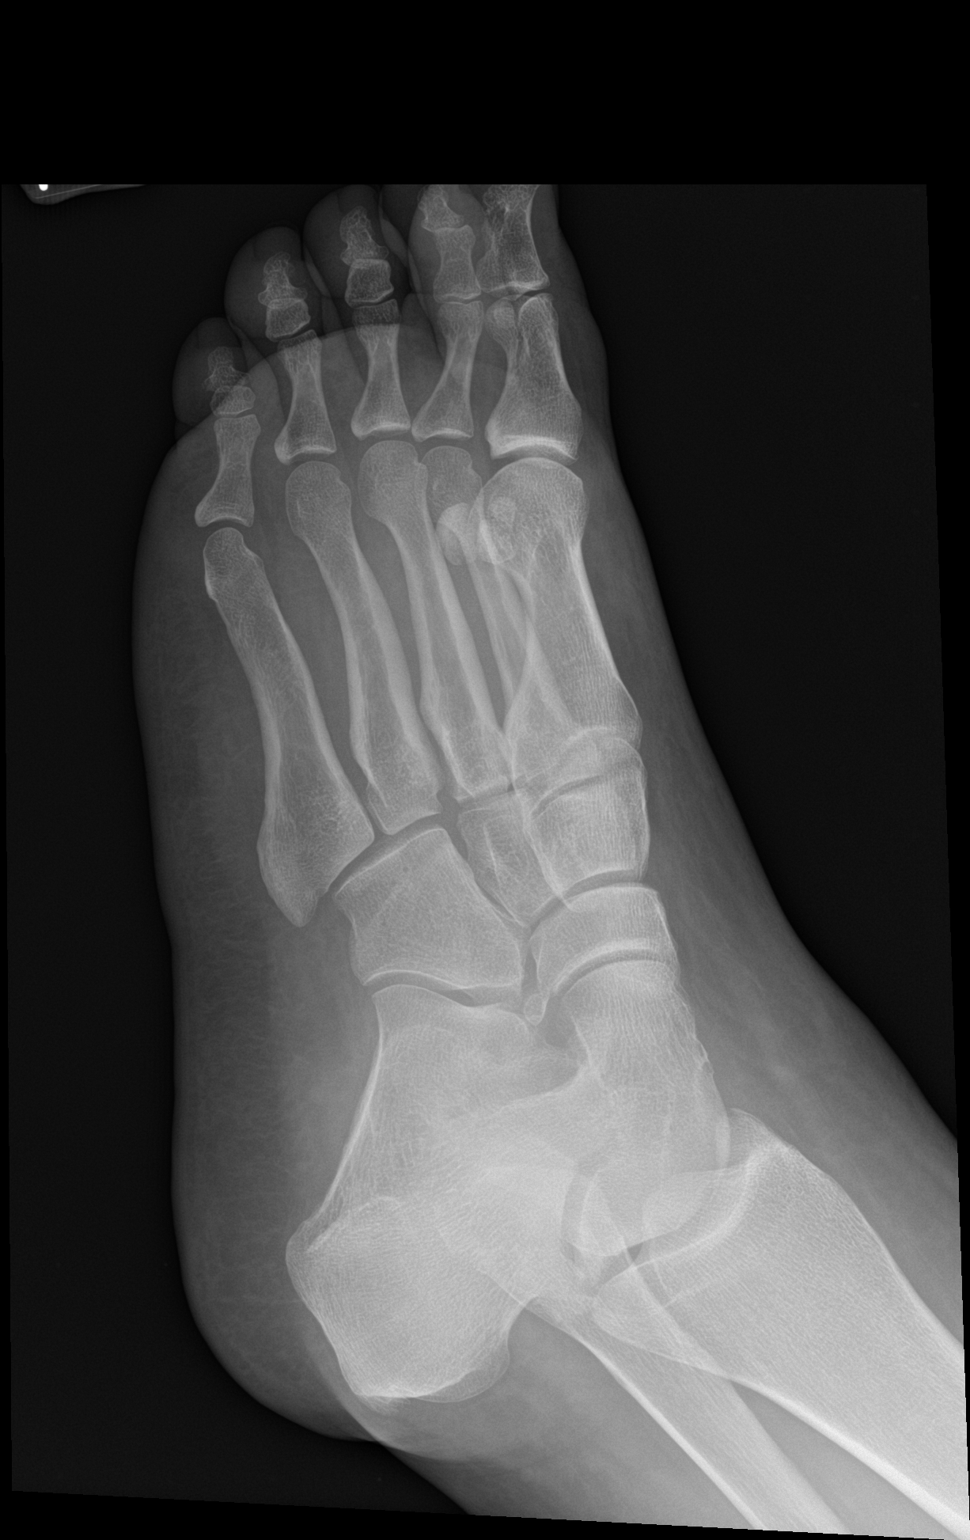

[3 of 3 positions shown; findings below may reference images not displayed]

FINDINGS: There is no evidence of fracture or dislocation. There is no
evidence of arthropathy. Small plantar and dorsal calcaneal bone
spurs are again noted. The soft tissues are unremarkable.
IMPRESSION: No acute findings.  Small calcaneal bone spurs.

## 2019-09-13 IMAGING — DX DG ANKLE COMPLETE 3+V*L*
3 series · 3 of 3 positions shown · non-contrast
Comparison: None.

CLINICAL DATA: Left ankle pain and swelling.  No known injury.

EXAM:
LEFT ANKLE COMPLETE - 3+ VIEW

[ankle ap]
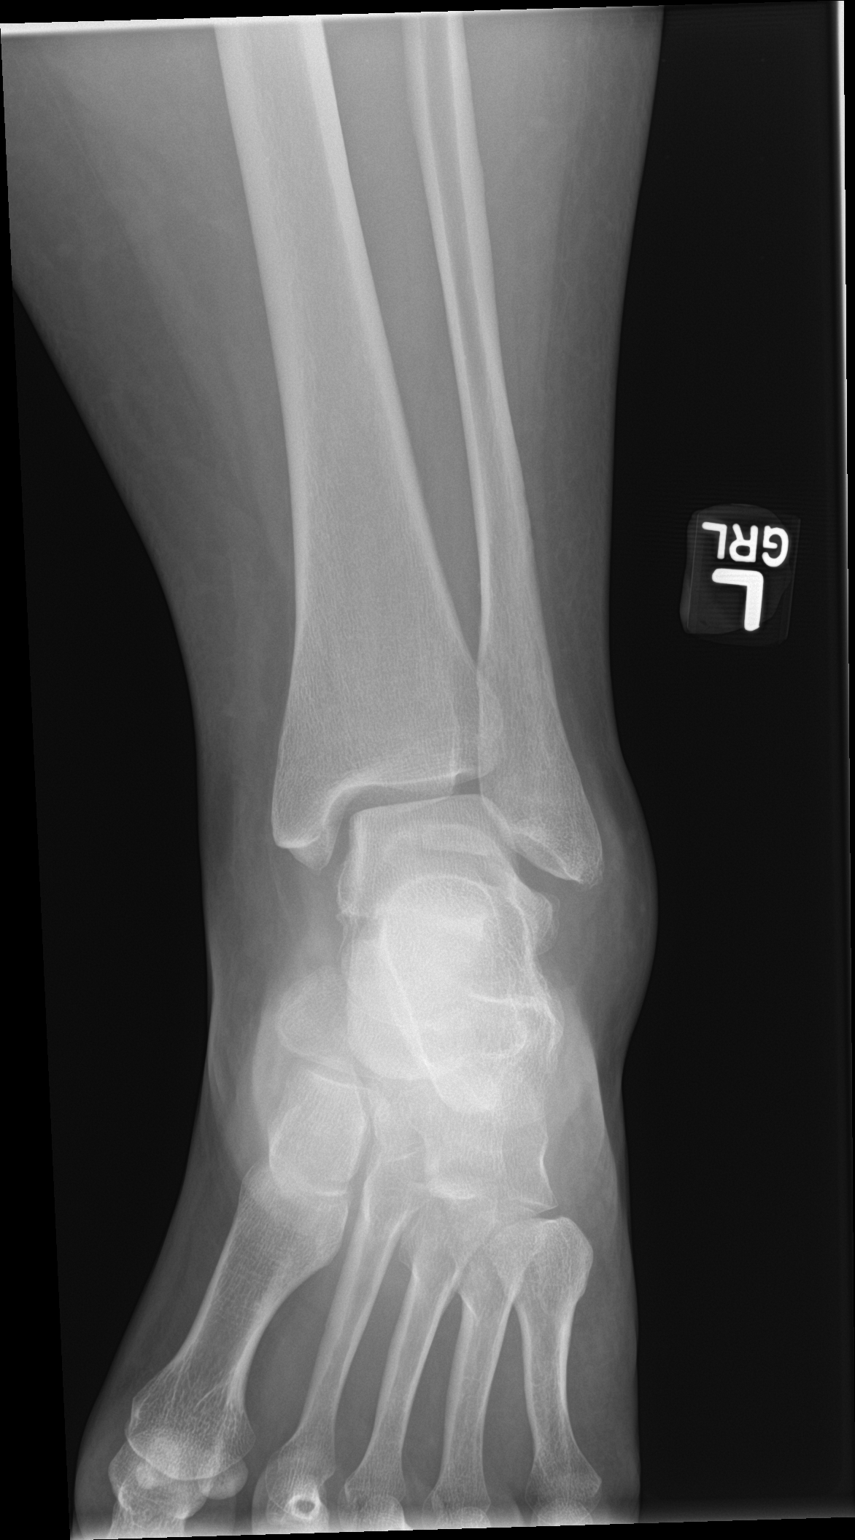

[ankle obl]
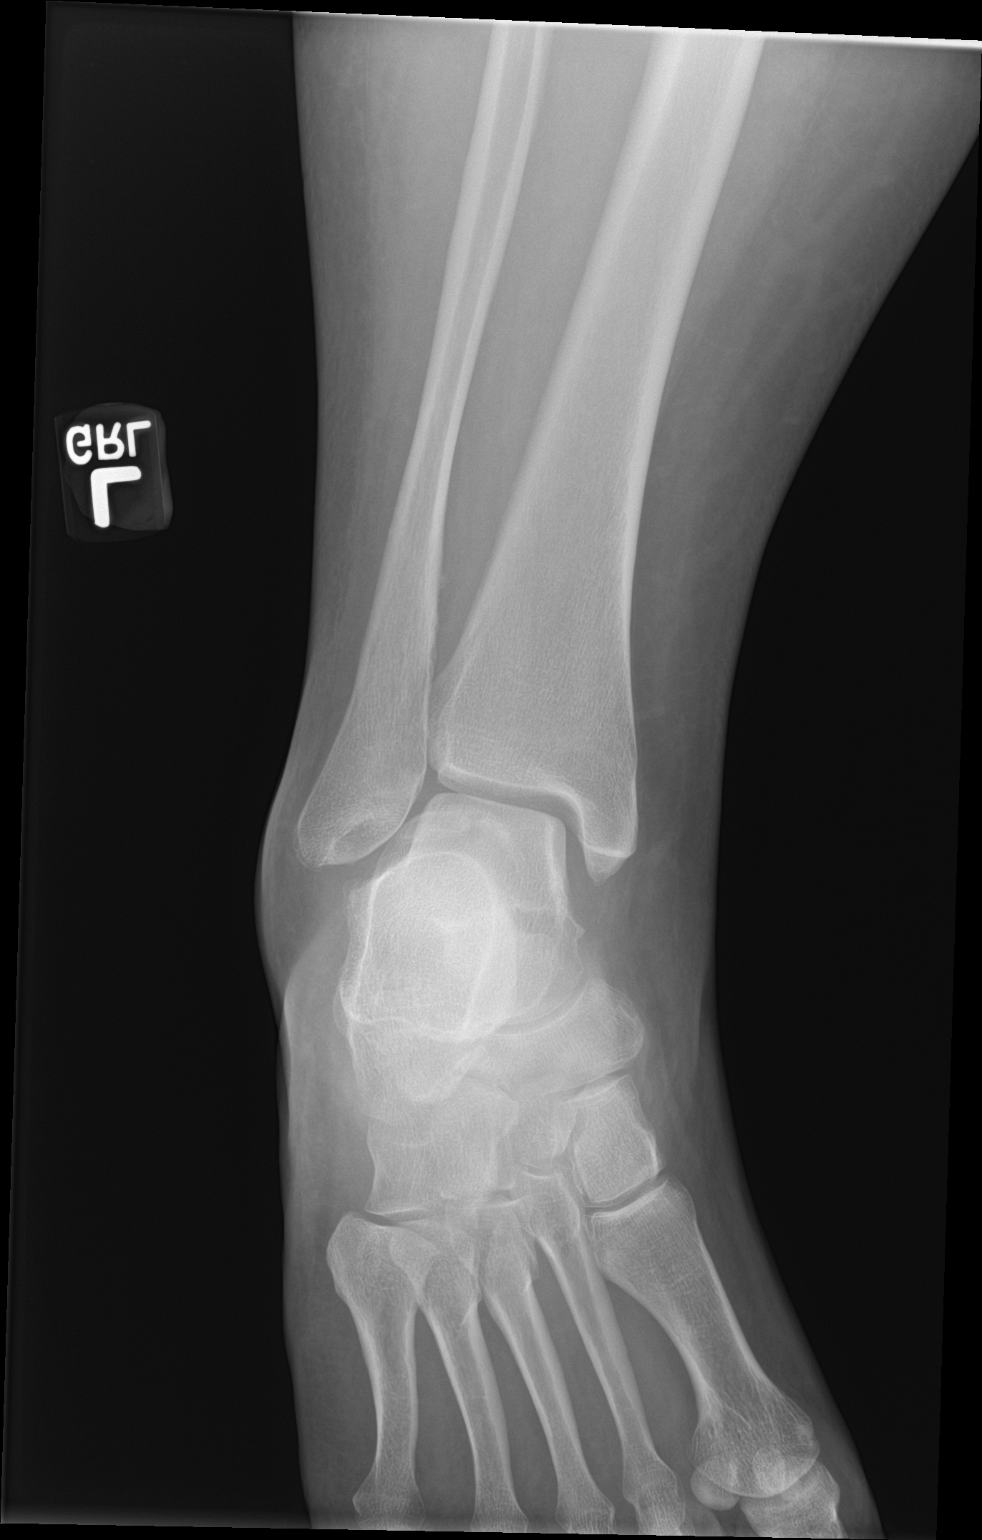

[ankle lat]
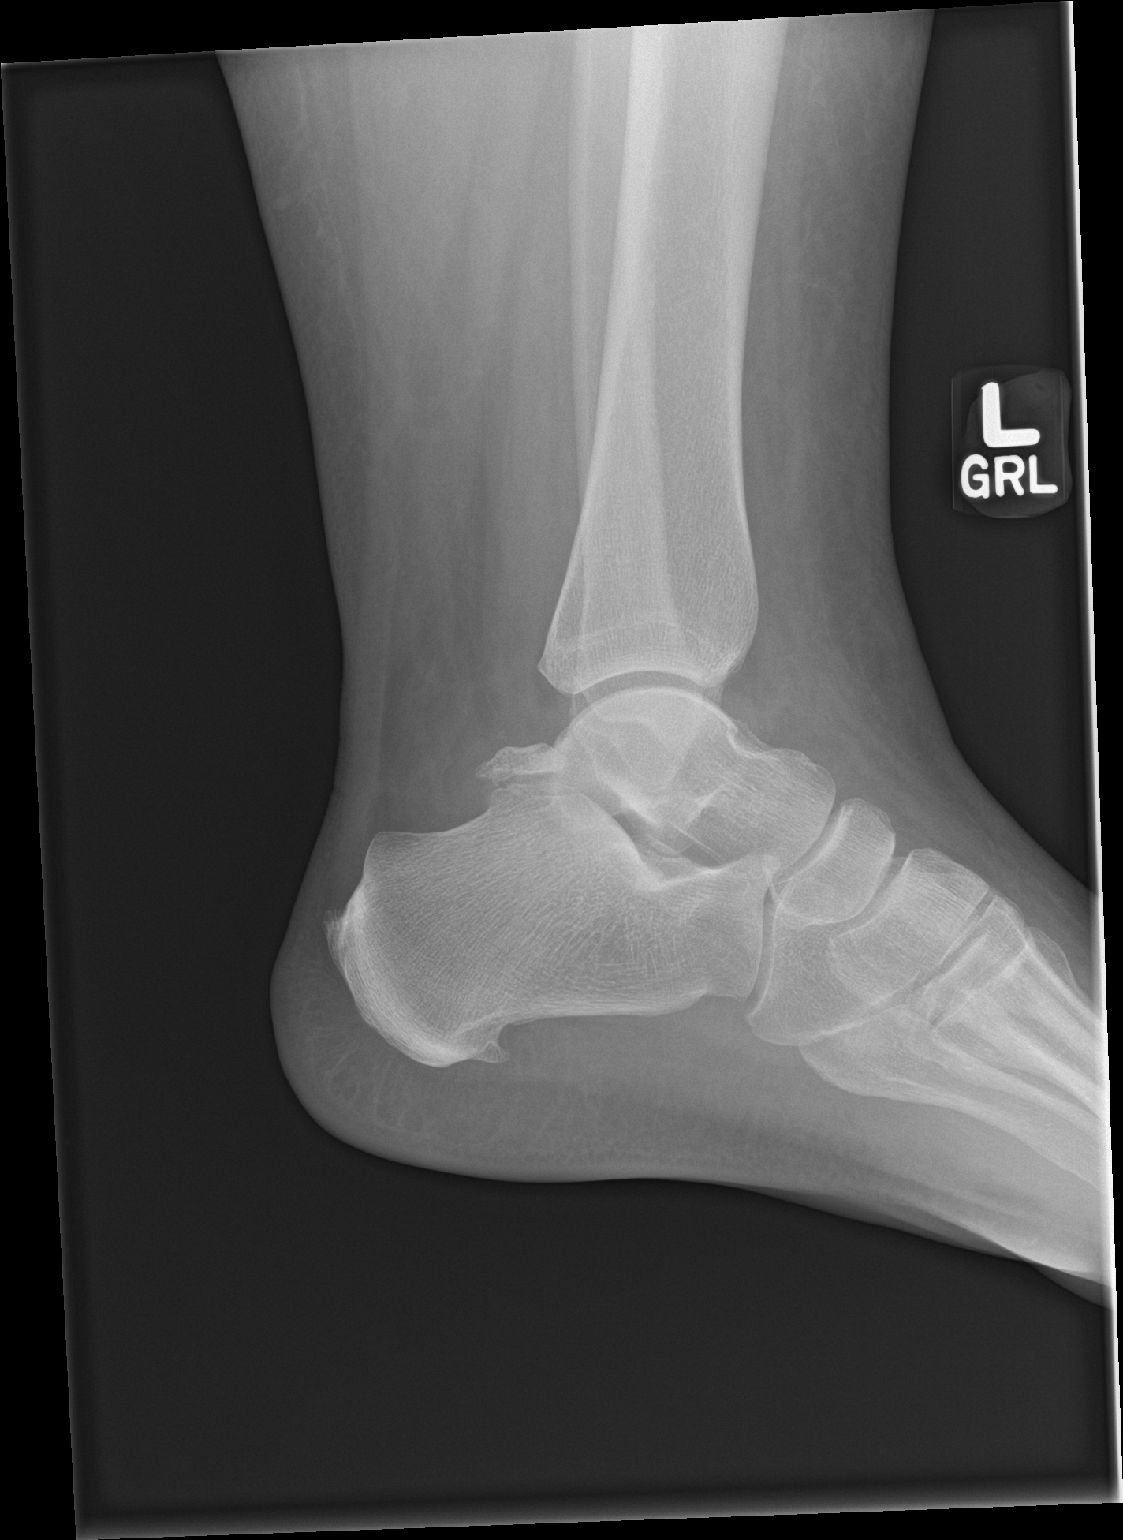

[3 of 3 positions shown; findings below may reference images not displayed]

FINDINGS: There is no evidence of fracture, dislocation, or joint effusion.
There is no evidence of arthropathy or other focal bone abnormality.
Soft tissues are unremarkable.
IMPRESSION: Negative.

## 2019-09-13 MED ORDER — FLUCONAZOLE 150 MG PO TABS: ORAL_TABLET | ORAL | 0 refills | Status: DC

## 2019-09-13 MED ORDER — METRONIDAZOLE 500 MG PO TABS: 500.0000 mg | ORAL_TABLET | Freq: Three times a day (TID) | ORAL | 0 refills | Status: AC

## 2019-09-13 MED ORDER — KETOROLAC TROMETHAMINE 30 MG/ML IJ SOLN
30.0000 mg | Freq: Once | INTRAMUSCULAR | Status: AC
  Administered 2019-09-13: 11:00:00 30 mg via INTRAMUSCULAR
  Filled 2019-09-13: qty 1

## 2019-09-13 MED ORDER — ORPHENADRINE CITRATE 30 MG/ML IJ SOLN
60.0000 mg | Freq: Two times a day (BID) | INTRAMUSCULAR | Status: DC
  Administered 2019-09-13: 11:00:00 60 mg via INTRAMUSCULAR
  Filled 2019-09-13: qty 2

## 2019-09-13 MED ORDER — LIDOCAINE HCL (PF) 1 % IJ SOLN
INTRAMUSCULAR | Status: AC
  Filled 2019-09-13: qty 5

## 2019-09-13 MED ORDER — CEFTRIAXONE SODIUM 250 MG IJ SOLR
250.0000 mg | Freq: Once | INTRAMUSCULAR | Status: AC
  Administered 2019-09-13: 11:00:00 250 mg via INTRAMUSCULAR
  Filled 2019-09-13: qty 250

## 2019-09-13 MED ORDER — AZITHROMYCIN 500 MG PO TABS
1000.0000 mg | ORAL_TABLET | Freq: Once | ORAL | Status: AC
Start: 2019-09-13 — End: 2019-09-13
  Administered 2019-09-13: 1000 mg via ORAL
  Filled 2019-09-13: qty 2

## 2019-09-13 NOTE — ED Triage Notes (Signed)
Pt in via POV, reports right side pelvic pain x one day, denies any urinary symptoms.  Also reports sore throat x 4 days.  NAD noted at this time.

## 2019-09-13 NOTE — Discharge Instructions (Addendum)
Follow-up with the [facility] if your STD testing is positive.  You have already been treated for gonorrhea and chlamydia.  However if it is positive you should be tested for HIV and syphilis.  The [facility] would do this testing.  The ER does not.  Take medication as prescribed.  If your testing is positive please notify all partners.
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
## 2019-09-15 LAB — URINE CULTURE: Culture: >=100000 — AB

## 2019-09-15 LAB — GC/CHLAMYDIA PROBE AMP
Chlamydia trachomatis, NAA: NEGATIVE
Neisseria Gonorrhoeae by PCR: NEGATIVE

## 2019-09-18 MED ORDER — IBUPROFEN 800 MG PO TABS: 800.0000 mg | ORAL_TABLET | Freq: Three times a day (TID) | ORAL | 0 refills | Status: DC | PRN

## 2019-09-18 NOTE — Addendum Note (Signed)
Addended by: Chevis Pretty on: 09/18/2019 07:56 AM   Modules accepted: Orders

## 2019-10-31 ENCOUNTER — Emergency Department
Admission: EM | Admit: 2019-10-31 | Discharge: 2019-10-31 | Disposition: A | Discharge status: Home / Self Care | Payer: BLUE CROSS/BLUE SHIELD | Attending: Student in an Organized Health Care Education/Training Program | Admitting: Student in an Organized Health Care Education/Training Program

## 2019-10-31 ENCOUNTER — Other Ambulatory Visit: Payer: Self-pay

## 2019-10-31 ENCOUNTER — Encounter: Payer: Self-pay | Admitting: Emergency Medicine

## 2019-10-31 ENCOUNTER — Emergency Department: Payer: BLUE CROSS/BLUE SHIELD

## 2019-10-31 DIAGNOSIS — Y9389 Activity, other specified: Secondary | ICD-10-CM | POA: Insufficient documentation

## 2019-10-31 DIAGNOSIS — Z79899 Other long term (current) drug therapy: Secondary | ICD-10-CM | POA: Diagnosis not present

## 2019-10-31 DIAGNOSIS — Y999 Unspecified external cause status: Secondary | ICD-10-CM | POA: Insufficient documentation

## 2019-10-31 DIAGNOSIS — M545 Low back pain, unspecified: Secondary | ICD-10-CM

## 2019-10-31 DIAGNOSIS — Y92410 Unspecified street and highway as the place of occurrence of the external cause: Secondary | ICD-10-CM | POA: Insufficient documentation

## 2019-10-31 DIAGNOSIS — T148XXA Other injury of unspecified body region, initial encounter: Secondary | ICD-10-CM | POA: Diagnosis not present

## 2019-10-31 LAB — POCT PREGNANCY, URINE: Preg Test, Ur: NEGATIVE

## 2019-10-31 MED ORDER — TIZANIDINE HCL 4 MG PO TABS: 4.0000 mg | ORAL_TABLET | Freq: Three times a day (TID) | ORAL | 0 refills | Status: DC

## 2019-10-31 MED ORDER — KETOROLAC TROMETHAMINE 10 MG PO TABS: 10.0000 mg | ORAL_TABLET | Freq: Four times a day (QID) | ORAL | 0 refills | Status: DC | PRN

## 2019-10-31 MED ORDER — ORPHENADRINE CITRATE 30 MG/ML IJ SOLN
60.0000 mg | Freq: Two times a day (BID) | INTRAMUSCULAR | Status: DC
  Administered 2019-10-31: 60 mg via INTRAMUSCULAR
  Filled 2019-10-31: qty 2

## 2019-10-31 MED ORDER — KETOROLAC TROMETHAMINE 30 MG/ML IJ SOLN
30.0000 mg | Freq: Once | INTRAMUSCULAR | Status: AC
  Administered 2019-10-31: 30 mg via INTRAMUSCULAR
  Filled 2019-10-31: qty 1
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
## 2019-10-31 NOTE — Discharge Instructions (Signed)
Please follow up with the primary care provider of your choice for symptoms that are not improving over the next few days.  Return to the ER for symptoms that change or worsen if unable to schedule an appointment.

## 2019-11-15 ENCOUNTER — Ambulatory Visit (INDEPENDENT_AMBULATORY_CARE_PROVIDER_SITE_OTHER): Payer: BLUE CROSS/BLUE SHIELD

## 2019-11-15 ENCOUNTER — Ambulatory Visit
Admission: EM | Admit: 2019-11-15 | Discharge: 2019-11-15 | Disposition: A | Discharge status: Home / Self Care | Payer: BLUE CROSS/BLUE SHIELD | Attending: Physician Assistant | Admitting: Physician Assistant

## 2019-11-15 DIAGNOSIS — M79672 Pain in left foot: Secondary | ICD-10-CM

## 2019-11-15 DIAGNOSIS — S99922A Unspecified injury of left foot, initial encounter: Secondary | ICD-10-CM | POA: Diagnosis not present

## 2019-11-15 MED ORDER — DICLOFENAC SODIUM 50 MG PO TBEC: 50.0000 mg | DELAYED_RELEASE_TABLET | Freq: Two times a day (BID) | ORAL | 0 refills | Status: DC
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
## 2019-11-15 NOTE — Discharge Instructions (Signed)
Xray negative for fracture/dislocation. Start diclofenac as directed. Do not take ibuprofen (motrin/advil)/ naproxen (aleve) while on diclofenac. Ice compress, elevation, rest. Follow up with occupational health for further evaluation and management needed.

## 2019-11-15 NOTE — ED Triage Notes (Signed)
Pt states took avil 400mg s around 7am

## 2019-11-15 NOTE — ED Provider Notes (Signed)
[facility]    CSN: [id number] Arrival date & time: 11/15/19  1219      History   Chief Complaint Chief Complaint  Patient presents with  . Foot Pain    HPI Gina Farmer is a 33 y.o. female.   33 year old female comes in for left foot pain after injury. States has baseline ankle pain bilaterally, however, current pain started 2 days ago after stepping on the stairs wrong while at work. Pain is to the left heal and medial foot. Had mild swelling, contusion that has improved in the past 2 days. Denies numbness/tingling. Has had painful ambulation without improvement on ibuprofen, ice compress and therefore came in for evaluation.      Past Medical History:  Diagnosis Date  . Anxiety   . Back injury   . Bipolar 1 disorder ([place])   . Depression   . Morbid obesity ([place])     There are no active problems to display for this patient.   Past Surgical History:  Procedure Laterality Date  . APPENDECTOMY    . INTRAOCULAR LENS IMPLANT, SECONDARY      OB History    Gravida  1   Para      Term      Preterm      AB      Living        SAB      TAB      Ectopic      Multiple      Live Births               Home Medications    Prior to Admission medications   Medication Sig Start Date End Date Taking? Authorizing Provider  amphetamine-dextroamphetamine (ADDERALL) 20 MG tablet Take 20 mg by mouth daily.    [provider]  ARIPiprazole (ABILIFY) 20 MG tablet Take 20 mg by mouth daily.    [provider]  benzonatate (TESSALON) 100 MG capsule Take 1-2 capsules (100-200 mg total) by mouth 3 (three) times daily as needed for cough. 09/12/19   McVey, Gelene Mink, PA-C  diclofenac (VOLTAREN) 50 MG EC tablet Take 1 tablet (50 mg total) by mouth 2 (two) times daily. 11/15/19   Tasia Catchings, Mande Auvil V, PA-C  fluticasone (FLONASE) 50 MCG/ACT nasal spray Place 2 sprays into both nostrils daily. 09/12/19   McVey, Gelene Mink, PA-C   gabapentin (NEURONTIN) 100 MG capsule Take 100 mg by mouth 3 (three) times daily.    [provider]  lurasidone (LATUDA) 40 MG TABS tablet Take 40 mg by mouth daily.    [provider]  omeprazole (PRILOSEC OTC) 20 MG tablet Take 1 tablet (20 mg total) by mouth daily. 05/25/17 05/25/18  Hinda Kehr, MD  tiZANidine (ZANAFLEX) 4 MG tablet Take 1 tablet (4 mg total) by mouth 3 (three) times daily. 10/31/19 10/30/20  Triplett, Johnette Abraham B, FNP  topiramate (TOPAMAX) 50 MG tablet Take 50 mg by mouth 2 (two) times daily.    [provider]    Family History No family history on file.  Social History Social History   Tobacco Use  . Smoking status: Current Every Day Smoker    Packs/day: 0.50    Types: Cigarettes  . Smokeless tobacco: Never Used  Substance Use Topics  . Alcohol use: No  . Drug use: No     Allergies   Trileptal [oxcarbazepine] and Viibryd [vilazodone hcl]   Review of Systems Review of Systems  Reason  unable to perform ROS: See HPI as above.     Physical Exam Triage Vital Signs ED Triage Vitals  Enc Vitals Group     BP 11/15/19 1236 133/87     Pulse Rate 11/15/19 1236 77     Resp 11/15/19 1236 18     Temp 11/15/19 1236 98.1 F (36.7 C)     Temp Source 11/15/19 1236 Oral     SpO2 11/15/19 1236 96 %     Weight --      Height --      Head Circumference --      Peak Flow --      Pain Score 11/15/19 1237 7     Pain Loc --      Pain Edu? --      Excl. in [place]? --    No data found.  Updated Vital Signs BP 133/87 (BP Location: Left Arm)   Pulse 77   Temp 98.4 F (36.9 C) (Oral)   Resp 18   LMP 11/05/2019   SpO2 96%   Physical Exam Constitutional:      General: She is not in acute distress.    Appearance: She is well-developed. She is not diaphoretic.  HENT:     Head: Normocephalic and atraumatic.  Eyes:     Conjunctiva/sclera: Conjunctivae normal.     Pupils: Pupils are equal, round, and reactive to light.  Pulmonary:      Effort: Pulmonary effort is normal. No respiratory distress.  Musculoskeletal:     Comments: No obvious swelling, erythema, contusion. Tenderness to palpation along left heel and proximal MTP. Full ROM of ankle and toes. Strength deferred. NVI  Skin:    General: Skin is warm and dry.  Neurological:     Mental Status: She is alert and oriented to person, place, and time.      UC Treatments / Results  Labs (all labs ordered are listed, but only abnormal results are displayed) Labs Reviewed - No data to display  EKG   Radiology Dg Foot Complete Left  Result Date: 11/15/2019 CLINICAL DATA:  Pain after inversion type injury EXAM: LEFT FOOT - COMPLETE 3+ VIEW COMPARISON:  None. FINDINGS: Frontal, oblique, and lateral views were obtained. No fracture or dislocation. Joint spaces appear normal. There are inferior and posterior calcaneal spurs. Bony overgrowth along the posterior talus is noted with pseudoarthrosis in this area, potentially residua of old trauma. IMPRESSION: No acute fracture or dislocation. Probable old trauma with bony remodeling and pseudoarthrosis posterior to the talus. There are calcaneal spurs. No joint space narrowing or erosion. Electronically Signed   By: Bretta Bang III M.D.   On: 11/15/2019 13:18    Procedures Procedures (including critical care time)  Medications Ordered in UC Medications - No data to display  Initial Impression / Assessment and Plan / UC Course  I have reviewed the triage vital signs and the nursing notes.  Pertinent labs & imaging results that were available during my care of the patient were reviewed by me and considered in my medical decision making (see chart for details).    Xray negative for fracture/dislocation. NSAIDs, ice compress, elevation. Given patient work requires long hours of walking/standing, will provide cam walker for symptomatic management. Follow up with occupational health for evaluation and management needed.  Return precautions given.  Final Clinical Impressions(s) / UC Diagnoses   Final diagnoses:  Left foot pain    ED Prescriptions    Medication Sig Dispense Auth. Provider  diclofenac (VOLTAREN) 50 MG EC tablet Take 1 tablet (50 mg total) by mouth 2 (two) times daily. 20 tablet Belinda FisherYu, Tarik Teixeira V, PA-C     PDMP not reviewed this encounter.   Belinda FisherYu, Ajanee Buren V, PA-C 11/15/19 1446

## 2019-12-08 ENCOUNTER — Other Ambulatory Visit: Payer: Self-pay

## 2019-12-08 ENCOUNTER — Emergency Department: Payer: BLUE CROSS/BLUE SHIELD

## 2019-12-08 DIAGNOSIS — Z3A00 Weeks of gestation of pregnancy not specified: Secondary | ICD-10-CM | POA: Insufficient documentation

## 2019-12-08 DIAGNOSIS — O2301 Infections of kidney in pregnancy, first trimester: Secondary | ICD-10-CM | POA: Diagnosis not present

## 2019-12-08 DIAGNOSIS — Z79899 Other long term (current) drug therapy: Secondary | ICD-10-CM | POA: Insufficient documentation

## 2019-12-08 DIAGNOSIS — F17210 Nicotine dependence, cigarettes, uncomplicated: Secondary | ICD-10-CM | POA: Insufficient documentation

## 2019-12-08 DIAGNOSIS — O26891 Other specified pregnancy related conditions, first trimester: Secondary | ICD-10-CM | POA: Diagnosis present

## 2019-12-08 LAB — CBC
HCT: 44.5 % (ref 36.0–46.0)
Hemoglobin: 14.9 g/dL (ref 12.0–15.0)
MCH: 29.2 pg (ref 26.0–34.0)
MCHC: 33.5 g/dL (ref 30.0–36.0)
MCV: 87.1 fL (ref 80.0–100.0)
Platelets: 341 10*3/uL (ref 150–400)
RBC: 5.11 MIL/uL (ref 3.87–5.11)
RDW: 13.2 % (ref 11.5–15.5)
WBC: 16.5 10*3/uL — ABNORMAL HIGH (ref 4.0–10.5)
nRBC: 0 % (ref 0.0–0.2)

## 2019-12-08 LAB — COMPREHENSIVE METABOLIC PANEL
ALT: 16 U/L (ref 0–44)
AST: 13 U/L — ABNORMAL LOW (ref 15–41)
Albumin: 3.7 g/dL (ref 3.5–5.0)
Alkaline Phosphatase: 69 U/L (ref 38–126)
Anion gap: 9 (ref 5–15)
BUN: 11 mg/dL (ref 6–20)
CO2: 24 mmol/L (ref 22–32)
Calcium: 8.8 mg/dL — ABNORMAL LOW (ref 8.9–10.3)
Chloride: 107 mmol/L (ref 98–111)
Creatinine, Ser: 0.58 mg/dL (ref 0.44–1.00)
GFR calc Af Amer: >60 mL/min (ref >60)
GFR calc non Af Amer: >60 mL/min (ref >60)
Glucose, Bld: 88 mg/dL (ref 70–99)
Potassium: 3.9 mmol/L (ref 3.5–5.1)
Sodium: 140 mmol/L (ref 135–145)
Total Bilirubin: 0.3 mg/dL (ref 0.3–1.2)
Total Protein: 7.5 g/dL (ref 6.5–8.1)

## 2019-12-08 LAB — POCT PREGNANCY, URINE: Preg Test, Ur: POSITIVE — AB

## 2019-12-08 LAB — URINALYSIS, COMPLETE (UACMP) WITH MICROSCOPIC
Bilirubin Urine: NEGATIVE
Glucose, UA: NEGATIVE mg/dL
Ketones, ur: NEGATIVE mg/dL
Nitrite: POSITIVE — AB
Protein, ur: NEGATIVE mg/dL
Specific Gravity, Urine: 1.024 (ref 1.005–1.030)
WBC, UA: >50 WBC/hpf — ABNORMAL HIGH (ref 0–5)
pH: 5 (ref 5.0–8.0)

## 2019-12-08 LAB — LIPASE, BLOOD: Lipase: 23 U/L (ref 11–51)

## 2019-12-08 LAB — HCG, QUANTITATIVE, PREGNANCY: hCG, Beta Chain, Quant, S: 667 m[IU]/mL — ABNORMAL HIGH (ref <5)

## 2019-12-08 MED ORDER — SODIUM CHLORIDE 0.9% FLUSH: 3.0000 mL | Freq: Once | INTRAVENOUS | Status: DC

## 2019-12-08 NOTE — ED Notes (Signed)
Pt to [place]. Pt informed of positive pregnancy.

## 2019-12-08 NOTE — ED Triage Notes (Signed)
Pt to the er with RLQ and pelvic pain with back pain. Pain started 3 days ago.

## 2019-12-08 NOTE — ED Notes (Signed)
Patient transported to Ultrasound 

## 2019-12-09 ENCOUNTER — Emergency Department
Admission: EM | Admit: 2019-12-09 | Discharge: 2019-12-09 | Disposition: A | Discharge status: Home / Self Care | Payer: BLUE CROSS/BLUE SHIELD | Attending: Emergency Medicine | Admitting: Emergency Medicine

## 2019-12-09 DIAGNOSIS — Z3490 Encounter for supervision of normal pregnancy, unspecified, unspecified trimester: Secondary | ICD-10-CM

## 2019-12-09 DIAGNOSIS — R1031 Right lower quadrant pain: Secondary | ICD-10-CM

## 2019-12-09 DIAGNOSIS — N12 Tubulo-interstitial nephritis, not specified as acute or chronic: Secondary | ICD-10-CM

## 2019-12-09 MED ORDER — LIDOCAINE HCL (PF) 1 % IJ SOLN
INTRAMUSCULAR | Status: AC
  Filled 2019-12-09: qty 5

## 2019-12-09 MED ORDER — ONDANSETRON 4 MG PO TBDP: ORAL_TABLET | ORAL | 0 refills | Status: DC

## 2019-12-09 MED ORDER — CEPHALEXIN 500 MG PO CAPS: 500.0000 mg | ORAL_CAPSULE | Freq: Four times a day (QID) | ORAL | 0 refills | Status: DC

## 2019-12-09 MED ORDER — ONDANSETRON 4 MG PO TBDP
4.0000 mg | ORAL_TABLET | Freq: Once | ORAL | Status: AC
  Administered 2019-12-09: 4 mg via ORAL
  Filled 2019-12-09: qty 1

## 2019-12-09 MED ORDER — CEFTRIAXONE SODIUM 1 G IJ SOLR
1.0000 g | Freq: Once | INTRAMUSCULAR | Status: AC
  Administered 2019-12-09: 1 g via INTRAMUSCULAR
  Filled 2019-12-09: qty 10

## 2019-12-09 NOTE — Discharge Instructions (Addendum)
As we discussed, your pregnancy test came back positive, but it is extremely early  and as of yet there is nothing to see on the ultrasound.  I recommend that you follow-up with an OB/GYN for repeat blood work (beta-hCG) and a repeat ultrasound to see if the baby is developing normally.  It is very important that you take the full course of antibiotics prescribed for your urinary tract infection which likely has gone to your kidneys.  If you are not feeling better within a couple of days or if you develop any new or worsening symptoms, please return immediately to the emergency department.

## 2019-12-09 NOTE — ED Provider Notes (Signed)
[facility] Emergency Department Provider Note  ____________________________________________   First MD Initiated Contact with Patient 12/09/19 0138     (approximate)  I have reviewed the triage vital signs and the nursing notes.   HISTORY  Chief Complaint Abdominal Pain    HPI Gina Farmer is a 33 y.o. female who presents for evaluation of burning when she urinates with pain in both sides of her lower back.  Symptoms started 3 days ago.  The pain is dull and intermittent and seems to be worse when she is sitting for extended periods of time.  She is started to have some nausea and intermittent vomiting.  She reports symptoms are moderate.  Nothing particular makes them better or worse.  She just found out she was pregnant as well and has had no vaginal bleeding.         Past Medical History:  Diagnosis Date  . Anxiety   . Back injury   . Bipolar 1 disorder ([facility])   . Depression   . Morbid obesity ([facility])     There are no problems to display for this patient.   Past Surgical History:  Procedure Laterality Date  . APPENDECTOMY    . INTRAOCULAR LENS IMPLANT, SECONDARY      Prior to Admission medications   Medication Sig Start Date End Date Taking? Authorizing Provider  amphetamine-dextroamphetamine (ADDERALL) 20 MG tablet Take 20 mg by mouth daily.    [provider]  ARIPiprazole (ABILIFY) 20 MG tablet Take 20 mg by mouth daily.    [provider]  benzonatate (TESSALON) 100 MG capsule Take 1-2 capsules (100-200 mg total) by mouth 3 (three) times daily as needed for cough. 09/12/19   McVey, Madelaine Bhat, PA-C  cephALEXin (KEFLEX) 500 MG capsule Take 1 capsule (500 mg total) by mouth 4 (four) times daily for 12 days. 12/09/19 12/21/19  Loleta Rose, MD  diclofenac (VOLTAREN) 50 MG EC tablet Take 1 tablet (50 mg total) by mouth 2 (two) times daily. 11/15/19   Cathie Hoops, Amy V, PA-C  fluticasone (FLONASE) 50 MCG/ACT nasal spray Place  2 sprays into both nostrils daily. 09/12/19   McVey, Madelaine Bhat, PA-C  gabapentin (NEURONTIN) 100 MG capsule Take 100 mg by mouth 3 (three) times daily.    [provider]  lurasidone (LATUDA) 40 MG TABS tablet Take 40 mg by mouth daily.    [provider]  omeprazole (PRILOSEC OTC) 20 MG tablet Take 1 tablet (20 mg total) by mouth daily. 05/25/17 05/25/18  Loleta Rose, MD  ondansetron (ZOFRAN ODT) 4 MG disintegrating tablet Allow 1-2 tablets to dissolve in your mouth every 8 hours as needed for nausea/vomiting 12/09/19   Loleta Rose, MD  tiZANidine (ZANAFLEX) 4 MG tablet Take 1 tablet (4 mg total) by mouth 3 (three) times daily. 10/31/19 10/30/20  Triplett, Rulon Eisenmenger B, FNP  topiramate (TOPAMAX) 50 MG tablet Take 50 mg by mouth 2 (two) times daily.    [provider]    Allergies Trileptal [oxcarbazepine] and Viibryd [vilazodone hcl]  No family history on file.  Social History Social History   Tobacco Use  . Smoking status: Current Every Day Smoker    Packs/day: 0.50    Types: Cigarettes  . Smokeless tobacco: Never Used  Substance Use Topics  . Alcohol use: Yes    Comment: social  . Drug use: Yes    Types: Marijuana    Comment: social    Review of Systems Constitutional: No fever/chills  Eyes: No visual changes. ENT: No sore throat. Cardiovascular: Denies chest pain. Respiratory: Denies shortness of breath. Gastrointestinal: Some mild nausea and vomiting.  Some lower abdominal/suprapubic pain and dysuria rating up to both sides of her lower back. Genitourinary: +dysuria. Musculoskeletal: Some low back pain radiating up from the suprapubic region Integumentary: Negative for rash. Neurological: Negative for headaches, focal weakness or numbness.   ____________________________________________   PHYSICAL EXAM:  VITAL SIGNS: ED Triage Vitals  Enc Vitals Group     BP 12/08/19 2037 117/78     Pulse Rate 12/08/19 2037 95     Resp 12/08/19  2037 18     Temp 12/08/19 2037 98.7 F (37.1 C)     Temp Source 12/08/19 2037 Oral     SpO2 12/08/19 2037 96 %     Weight 12/08/19 2038 (!) 165.4 kg (364 lb 11.2 oz)     Height 12/08/19 2038 1.549 m (5\' 1" )     Head Circumference --      Peak Flow --      Pain Score 12/08/19 2038 7     Pain Loc --      Pain Edu? --      Excl. in [place]? --     Constitutional: Alert and oriented.  Well-appearing and in no distress. Eyes: Conjunctivae are normal.  Head: Atraumatic. Nose: No congestion/rhinnorhea. Mouth/Throat: Patient is wearing a mask. Neck: No stridor.  No meningeal signs.   Cardiovascular: Normal rate, regular rhythm. Good peripheral circulation. Grossly normal heart sounds. Respiratory: Normal respiratory effort.  No retractions. Gastrointestinal: Soft with mild diffuse tenderness throughout the abdomen. Musculoskeletal: Equivocal mild left CVA tenderness to percussion.  No lower extremity tenderness nor edema. No gross deformities of extremities. Neurologic:  Normal speech and language. No gross focal neurologic deficits are appreciated.  Skin:  Skin is warm, dry and intact. Psychiatric: Mood and affect are normal. Speech and behavior are normal.  ____________________________________________   LABS (all labs ordered are listed, but only abnormal results are displayed)  Labs Reviewed  COMPREHENSIVE METABOLIC PANEL - Abnormal; Notable for the following components:      Result Value   Calcium 8.8 (*)    AST 13 (*)    All other components within normal limits  CBC - Abnormal; Notable for the following components:   WBC 16.5 (*)    All other components within normal limits  URINALYSIS, COMPLETE (UACMP) WITH MICROSCOPIC - Abnormal; Notable for the following components:   Color, Urine YELLOW (*)    APPearance CLOUDY (*)    Hgb urine dipstick LARGE (*)    Nitrite POSITIVE (*)    Leukocytes,Ua LARGE (*)    WBC, UA >50 (*)    Bacteria, UA MANY (*)    All other components  within normal limits  HCG, QUANTITATIVE, PREGNANCY - Abnormal; Notable for the following components:   hCG, Beta Chain, Quant, S 667 (*)    All other components within normal limits  POCT PREGNANCY, URINE - Abnormal; Notable for the following components:   Preg Test, Ur POSITIVE (*)    All other components within normal limits  URINE CULTURE  LIPASE, BLOOD  POC URINE PREG, ED   ____________________________________________  EKG  No indication for EKG ____________________________________________  RADIOLOGY I, Hinda Kehr, personally viewed and evaluated these images (plain radiographs) as part of my medical decision making, as well as reviewing the written report by the radiologist.  ED MD interpretation: No acute abnormalities identified, likely early IUP or less  likely an occult ectopic or recent spontaneous AB.  Official radiology report(s): US OB LESS THAN 14 WEEKS WITH OB TRANSVAGINAL  Result Date: 12/08/2019 CLINICAL DATA:  33 year old female with positive HCG level and pelvic pain. LMP: 11/05/2019 corresponding to an estimated gestational age of [redacted] weeks, 5 days. EXAM: OBSTETRIC <14 WK US AND TRANSVAGINAL OB US TECHNIQUE: Both transabdominal and transvaginal ultrasound examinations were performed for complete evaluation of the gestation as well as the maternal uterus, adnexal regions, and pelvic cul-de-sac. Transvaginal technique was performed to assess early pregnancy. COMPARISON:  None. FINDINGS: The uterus is anteverted and appears unremarkable. The endometrium measures approximately 8 mm. Faint apparent hypoechoic foci in the upper endometrium measuring 3 mm may be artifactual or represent tiny endometrial cysts. No definite gestational sac identified. The maternal ovaries are unremarkable. The right ovary measures 3.4 x 2.3 x 2.5 cm and the left ovary measures 2.9 x 1.6 x 2.0 cm. No free fluid within the pelvis. IMPRESSION: No definite intrauterine pregnancy identified and no  adnexal masses noted. Findings consistent with pregnancy of unknown location and differential diagnosis includes an early IUP, recent spontaneous abortion, or an occult ectopic pregnancy. Clinical correlation and follow-up with serial HCG levels and repeat ultrasound in 7-11 days, or earlier if clinically indicated, recommended. Electronically Signed   By: Elgie CollardArash  Radparvar M.D.   On: 12/08/2019 23:23    ____________________________________________   PROCEDURES   Procedure(s) performed (including Critical Care):  Procedures   ____________________________________________   INITIAL IMPRESSION / MDM / ASSESSMENT AND PLAN / ED COURSE  As part of my medical decision making, I reviewed the following data within the electronic MEDICAL RECORD NUMBER Nursing notes reviewed and incorporated, Labs reviewed , Old chart reviewed and Notes from prior ED visits and [place] controlled [place] database.   Differential diagnosis includes, but is not limited to, UTI/pyonephritis, ureteral stone, ectopic pregnancy, missed abortion.  The patient is well-appearing and in no distress.  Labs are indicative of pyelonephritis with a moderate leukocytosis and a grossly positive urinalysis.  I ordered a urine culture.  Urine pregnancy test is positive and beta-hCG is only 667 consistent with a very early pregnancy which correlates with no observable IUP on ultrasound.  Comprehensive metabolic panel is normal.  I think that ureteral colic is very unlikely based on the patient's history of present illness and I cannot scan her given her very early pregnancy as it would be dangerous to the fetus, but again I have a low suspicion for an obstructive stone.  I offered ceftriaxone 1 g IV but she declines IV at this time but she agreed to ceftriaxone 1 g intramuscular to begin treatment.  I will treat her as an outpatient for pyelonephritis (if she was second trimester I would likely admit her but this is a very  early pregnancy that may not be developing anyway).  She understands the usual return precautions and will follow up with an OB/GYN for repeat hCG and ultrasound testing.            ____________________________________________  FINAL CLINICAL IMPRESSION(S) / ED DIAGNOSES  Final diagnoses:  Pyelonephritis  Pregnancy at early stage     MEDICATIONS GIVEN DURING THIS VISIT:  Medications  sodium chloride flush (NS) 0.9 % injection 3 mL (has no administration in time range)  cefTRIAXone (ROCEPHIN) injection 1 g (has no administration in time range)  lidocaine (PF) (XYLOCAINE) 1 % injection (has no administration in time range)  ondansetron (ZOFRAN-ODT) disintegrating tablet 4 mg (  4 mg Oral Given 12/09/19 0206)     ED Discharge Orders         Ordered    ondansetron (ZOFRAN ODT) 4 MG disintegrating tablet     12/09/19 0208    cephALEXin (KEFLEX) 500 MG capsule  4 times daily     12/09/19 0208          *Please note:  Gina Farmer was evaluated in Emergency Department on 12/09/2019 for the symptoms described in the history of present illness. She was evaluated in the context of the global COVID-19 pandemic, which necessitated consideration that the patient might be at risk for infection with the SARS-CoV-2 virus that causes COVID-19. Institutional protocols and algorithms that pertain to the evaluation of patients at risk for COVID-19 are in a state of rapid change based on information released by regulatory bodies including the CDC and federal and state organizations. These policies and algorithms were followed during the patient's care in the ED.  Some ED evaluations and interventions may be delayed as a result of limited staffing during the pandemic.*  Note:  This document was prepared using Dragon voice recognition software and may include unintentional dictation errors.   Loleta Rose, MD 12/09/19 [phone]

## 2019-12-11 LAB — URINE CULTURE
Culture: >=100000 — AB
Special Requests: NORMAL

## 2019-12-12 ENCOUNTER — Inpatient Hospital Stay (HOSPITAL_COMMUNITY): Payer: BLUE CROSS/BLUE SHIELD

## 2019-12-12 ENCOUNTER — Encounter (HOSPITAL_COMMUNITY): Payer: Self-pay | Admitting: Obstetrics and Gynecology

## 2019-12-12 ENCOUNTER — Inpatient Hospital Stay (HOSPITAL_COMMUNITY)
Admission: AD | Admit: 2019-12-12 | Discharge: 2019-12-12 | Disposition: A | Discharge status: Home / Self Care | Payer: BLUE CROSS/BLUE SHIELD | Attending: Obstetrics and Gynecology | Admitting: Obstetrics and Gynecology

## 2019-12-12 ENCOUNTER — Other Ambulatory Visit: Payer: Self-pay

## 2019-12-12 DIAGNOSIS — O26891 Other specified pregnancy related conditions, first trimester: Secondary | ICD-10-CM | POA: Insufficient documentation

## 2019-12-12 DIAGNOSIS — O26899 Other specified pregnancy related conditions, unspecified trimester: Secondary | ICD-10-CM

## 2019-12-12 DIAGNOSIS — O99211 Obesity complicating pregnancy, first trimester: Secondary | ICD-10-CM | POA: Diagnosis not present

## 2019-12-12 DIAGNOSIS — Z3A01 Less than 8 weeks gestation of pregnancy: Secondary | ICD-10-CM | POA: Diagnosis not present

## 2019-12-12 DIAGNOSIS — O99331 Smoking (tobacco) complicating pregnancy, first trimester: Secondary | ICD-10-CM | POA: Diagnosis not present

## 2019-12-12 DIAGNOSIS — O3680X0 Pregnancy with inconclusive fetal viability, not applicable or unspecified: Secondary | ICD-10-CM | POA: Diagnosis not present

## 2019-12-12 DIAGNOSIS — F419 Anxiety disorder, unspecified: Secondary | ICD-10-CM | POA: Diagnosis not present

## 2019-12-12 DIAGNOSIS — O98311 Other infections with a predominantly sexual mode of transmission complicating pregnancy, first trimester: Secondary | ICD-10-CM | POA: Insufficient documentation

## 2019-12-12 DIAGNOSIS — A599 Trichomoniasis, unspecified: Secondary | ICD-10-CM

## 2019-12-12 DIAGNOSIS — A5901 Trichomonal vulvovaginitis: Secondary | ICD-10-CM | POA: Diagnosis not present

## 2019-12-12 DIAGNOSIS — Z3201 Encounter for pregnancy test, result positive: Secondary | ICD-10-CM

## 2019-12-12 DIAGNOSIS — O209 Hemorrhage in early pregnancy, unspecified: Secondary | ICD-10-CM | POA: Insufficient documentation

## 2019-12-12 DIAGNOSIS — Z79899 Other long term (current) drug therapy: Secondary | ICD-10-CM | POA: Insufficient documentation

## 2019-12-12 DIAGNOSIS — Z791 Long term (current) use of non-steroidal anti-inflammatories (NSAID): Secondary | ICD-10-CM | POA: Insufficient documentation

## 2019-12-12 DIAGNOSIS — O99341 Other mental disorders complicating pregnancy, first trimester: Secondary | ICD-10-CM | POA: Diagnosis not present

## 2019-12-12 DIAGNOSIS — F319 Bipolar disorder, unspecified: Secondary | ICD-10-CM | POA: Insufficient documentation

## 2019-12-12 DIAGNOSIS — R109 Unspecified abdominal pain: Secondary | ICD-10-CM | POA: Insufficient documentation

## 2019-12-12 DIAGNOSIS — F17210 Nicotine dependence, cigarettes, uncomplicated: Secondary | ICD-10-CM | POA: Insufficient documentation

## 2019-12-12 DIAGNOSIS — Z7951 Long term (current) use of inhaled steroids: Secondary | ICD-10-CM | POA: Insufficient documentation

## 2019-12-12 LAB — CBC
HCT: 40.6 % (ref 36.0–46.0)
Hemoglobin: 13.2 g/dL (ref 12.0–15.0)
MCH: 29.7 pg (ref 26.0–34.0)
MCHC: 32.5 g/dL (ref 30.0–36.0)
MCV: 91.4 fL (ref 80.0–100.0)
Platelets: 309 10*3/uL (ref 150–400)
RBC: 4.44 MIL/uL (ref 3.87–5.11)
RDW: 13.2 % (ref 11.5–15.5)
WBC: 13.9 10*3/uL — ABNORMAL HIGH (ref 4.0–10.5)
nRBC: 0 % (ref 0.0–0.2)

## 2019-12-12 LAB — URINALYSIS, ROUTINE W REFLEX MICROSCOPIC
Bacteria, UA: NONE SEEN
Bilirubin Urine: NEGATIVE
Glucose, UA: NEGATIVE mg/dL
Ketones, ur: NEGATIVE mg/dL
Nitrite: NEGATIVE
Protein, ur: NEGATIVE mg/dL
Specific Gravity, Urine: 1.025 (ref 1.005–1.030)
WBC, UA: >50 WBC/hpf — ABNORMAL HIGH (ref 0–5)
pH: 5 (ref 5.0–8.0)

## 2019-12-12 LAB — WET PREP, GENITAL
Clue Cells Wet Prep HPF POC: NONE SEEN
Sperm: NONE SEEN
Yeast Wet Prep HPF POC: NONE SEEN

## 2019-12-12 LAB — HCG, QUANTITATIVE, PREGNANCY: hCG, Beta Chain, Quant, S: 1683 m[IU]/mL — ABNORMAL HIGH (ref <5)

## 2019-12-12 MED ORDER — HYDROXYZINE HCL 10 MG PO TABS
10.0000 mg | ORAL_TABLET | Freq: Once | ORAL | Status: DC
  Filled 2019-12-12: qty 1

## 2019-12-12 MED ORDER — METRONIDAZOLE 500 MG PO TABS
2000.0000 mg | ORAL_TABLET | Freq: Once | ORAL | Status: AC
  Administered 2019-12-12: 2000 mg via ORAL
  Filled 2019-12-12: qty 4

## 2019-12-12 MED ORDER — HYDROXYZINE HCL 10 MG PO TABS: 10.0000 mg | ORAL_TABLET | Freq: Four times a day (QID) | ORAL | 1 refills | Status: DC | PRN

## 2019-12-12 NOTE — MAU Note (Signed)
Presents with c/o lower RLQ pain and questionable VB, unsure if bleeding is VB vs hematuria.  Reports currently being treated for UTI, taking Cephalexin.

## 2019-12-12 NOTE — MAU Note (Addendum)
Upon asking SI/HI questions. Pt revealed to RN that she does not currently have a desire to kill herself or anyone else. However, did want to disclose that she has a hx of self mutilation in the form of cutting and within the last month has had thoughts of self mutilation.   Pt reports that she is bipolar and has experienced both highs and lows recently.   Pt would like to talk to someone about her anxiety and states that she feels better when she is on medication. Pt was last on medication 1-2 years ago.

## 2019-12-12 NOTE — BH Assessment (Signed)
By phone, spoke with Nicki at MAU. She reports pt going to U/S shortly. Next TTS shift will be notified pt in need of consult.
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
## 2019-12-12 NOTE — MAU Provider Note (Addendum)
History     CSN: [id number]  Arrival date and time: 12/12/19 1533   First Provider Initiated Contact with Patient 12/12/19 1714      Chief Complaint  Patient presents with  . Abdominal Pain  . Vaginal Bleeding   HPI Gina Farmer is a 33 y.o. G2P0010 at [redacted]w[redacted]d who presents to MAU with chief complaints of abdominal cramping, vaginal bleeding, urinary frequency, and recurrence of anxiety and contemplation of self injury.   Abdominal cramping, vaginal bleeding, urinary frequency These are new problems, onset today. Patient endorses mild "crampy" suprapubic discomfort and "ovary pain". She endorses seeing smears of blood when she voids. She is s/p diagnosis and treatment for UTI and is not completely sure if she is bleeding vaginally or from her urethra.  Anxiety and Thoughts of Self-Harm These are recurrent problems, most recent exacerbation 17 years ago. Patient endorses history of managing her anxiety with cutting. She has not been under the care of mental health team or on medication for the past two years.  Patient previously managed her anxiety with Vistaril and requests prescription for same.She endorses new thoughts of cutting and harming herself. She denies plan for self-harm. She denies HI or IPV. She is amenable to telepsych consult in MAU.   OB History    Gravida  2   Para      Term      Preterm      AB  1   Living        SAB      TAB  1   Ectopic      Multiple      Live Births              Past Medical History:  Diagnosis Date  . Anxiety   . Back injury   . Bipolar 1 disorder (HCC)   . Depression   . Morbid obesity (HCC)     Past Surgical History:  Procedure Laterality Date  . APPENDECTOMY    . INTRAOCULAR LENS IMPLANT, SECONDARY      Family History  Problem Relation Age of Onset  . Hypertension Father   . Heart disease Father   . Narcolepsy Father   . Obesity Father   . Hypertension Brother   . Cancer Maternal Aunt   . Obesity  Maternal Aunt   . Obesity Paternal Aunt   . Heart disease Maternal Grandfather     Social History   Tobacco Use  . Smoking status: Current Every Day Smoker    Packs/day: 0.50    Types: Cigarettes  . Smokeless tobacco: Never Used  Substance Use Topics  . Alcohol use: Not Currently    Comment: social  . Drug use: Yes    Types: Marijuana    Comment: social    Allergies:  Allergies  Allergen Reactions  . Trileptal [Oxcarbazepine] Swelling  . Viibryd [Vilazodone Hcl] Swelling    Medications Prior to Admission  Medication Sig Dispense Refill Last Dose  . cephALEXin (KEFLEX) 500 MG capsule Take 1 capsule (500 mg total) by mouth 4 (four) times daily for 12 days. 48 capsule 0 12/12/2019 at Unknown time  . diclofenac (VOLTAREN) 50 MG EC tablet Take 1 tablet (50 mg total) by mouth 2 (two) times daily. 20 tablet 0 Past Month at Unknown time  . fluticasone (FLONASE) 50 MCG/ACT nasal spray Place 2 sprays into both nostrils daily. 16 g 6 Past Month at Unknown time  . ibuprofen (ADVIL) 800 MG tablet Take  800 mg by mouth every 8 (eight) hours as needed.   Past Month at Unknown time  . ondansetron (ZOFRAN ODT) 4 MG disintegrating tablet Allow 1-2 tablets to dissolve in your mouth every 8 hours as needed for nausea/vomiting 30 tablet 0 12/12/2019 at Unknown time  . tiZANidine (ZANAFLEX) 4 MG tablet Take 1 tablet (4 mg total) by mouth 3 (three) times daily. 30 tablet 0 Past Week at Unknown time  . amphetamine-dextroamphetamine (ADDERALL) 20 MG tablet Take 20 mg by mouth daily.     . ARIPiprazole (ABILIFY) 20 MG tablet Take 20 mg by mouth daily.     . benzonatate (TESSALON) 100 MG capsule Take 1-2 capsules (100-200 mg total) by mouth 3 (three) times daily as needed for cough. 40 capsule 0   . gabapentin (NEURONTIN) 100 MG capsule Take 100 mg by mouth 3 (three) times daily.     Marland Kitchen. lurasidone (LATUDA) 40 MG TABS tablet Take 40 mg by mouth daily.     Marland Kitchen. omeprazole (PRILOSEC OTC) 20 MG tablet Take 1  tablet (20 mg total) by mouth daily. 28 tablet 1   . topiramate (TOPAMAX) 50 MG tablet Take 50 mg by mouth 2 (two) times daily.       Review of Systems  Constitutional: Negative for chills, fatigue and fever.  Gastrointestinal: Positive for abdominal pain. Negative for nausea and vomiting.  Genitourinary: Positive for vaginal bleeding. Negative for difficulty urinating, dysuria, flank pain and vaginal discharge.  All other systems reviewed and are negative.  Physical Exam   Blood pressure 115/66, pulse 79, temperature 98 F (36.7 C), temperature source Oral, resp. rate 20, height 5' 1.5" (1.562 m), weight 127.5 kg, last menstrual period 11/05/2019, SpO2 100 %, unknown if currently breastfeeding.  Physical Exam  Nursing note reviewed. Constitutional: She is oriented to person, place, and time. She appears well-developed and well-nourished.  Cardiovascular: Normal rate.  Respiratory: Effort normal and breath sounds normal.  GI: Soft. She exhibits no distension. There is no abdominal tenderness. There is no rebound and no guarding.  Genitourinary:    Genitourinary Comments: No bleeding observed on speculum exam. Moderate amount of green-tinged fluid proximal to cervical os. No contact bleeding or CMT.   Neurological: She is alert and oriented to person, place, and time.  Skin: Skin is warm and dry.  Psychiatric: She has a normal mood and affect. Her behavior is normal. Judgment and thought content normal.    MAU Course/MDM  Procedures  --Vistaril held until after telepsych consult per telepsych policy --Trichomonas treated in MAU. Patient offered prescription for expedited partner treatment --Discussed typical follow-up for pregnancy of unknown location. Patient states she is terminating pregnancy "really soon" and declines follow-up appointments  Report given to V. Aundria Rudogers, CNM who assumes care of patient at this time.  Clayton BiblesSamantha Weinhold, MSN, CNM Certified Nurse Midwife, [facility]  [facility], [facility] 12/12/19 8:05 PM   2115: TTS called CNM and cleared patient to be discharged home - TTS faxed over to MAU community resources for patient   - Patient drove self to hospital, discussed with patient we could prescribe vistaril for home use and patient to stop by pharmacy for pickup on the way home from MAU - patient verbalizes understanding and agrees to plan of care.   - Rx for partner treatment given to patient- patient reports she has had 3 partners and needs 3 prescriptions - Rx given and encouraged to abstain from intercourse for 10 days  after treatment  - Encouraged patient to follow up Friday in MAU for repeat HCG, information given to patient - patient declines at this time.   Discussed reasons to return to MAU. Return to MAU as needed. Pt stable at time of discharge. Rx for Vistaril sent to pharmacy of choice.   Assessment and Plan   1. Pregnancy of unknown anatomic location   2. Vaginal bleeding in pregnancy, first trimester   3. Abdominal cramping affecting pregnancy   4. Anxiety disorder affecting pregnancy, antepartum   5. Trichomonal vaginitis during pregnancy in first trimester    Discharge home Follow up in MAU on Friday for repeat HCG  Return to MAU as needed for reasons discussed and/or emergencies  Rx for Vistaril  Cleared by TTS   Follow-up Information    [facility]. Go on 12/15/2019.   Specialty: Obstetrics and Gynecology Why: Return to MAU on Friday 12/15/19 for repeat HCG labs Contact information: [address] [id number] [place] [phone]         Allergies as of 12/12/2019      Reactions   Trileptal [oxcarbazepine] Swelling   Viibryd [vilazodone Hcl] Swelling      Medication List    STOP taking these medications   amphetamine-dextroamphetamine 20 MG tablet Commonly known as: ADDERALL   ARIPiprazole 20 MG tablet Commonly  known as: ABILIFY   diclofenac 50 MG EC tablet Commonly known as: VOLTAREN   ibuprofen 800 MG tablet Commonly known as: ADVIL   topiramate 50 MG tablet Commonly known as: TOPAMAX     TAKE these medications   benzonatate 100 MG capsule Commonly known as: TESSALON Take 1-2 capsules (100-200 mg total) by mouth 3 (three) times daily as needed for cough.   cephALEXin 500 MG capsule Commonly known as: KEFLEX Take 1 capsule (500 mg total) by mouth 4 (four) times daily for 12 days.   fluticasone 50 MCG/ACT nasal spray Commonly known as: FLONASE Place 2 sprays into both nostrils daily.   gabapentin 100 MG capsule Commonly known as: NEURONTIN Take 100 mg by mouth 3 (three) times daily.   hydrOXYzine 10 MG tablet Commonly known as: ATARAX/VISTARIL Take 1 tablet (10 mg total) by mouth every 6 (six) hours as needed for anxiety.   lurasidone 40 MG Tabs tablet Commonly known as: LATUDA Take 40 mg by mouth daily.   omeprazole 20 MG tablet Commonly known as: PriLOSEC OTC Take 1 tablet (20 mg total) by mouth daily.   ondansetron 4 MG disintegrating tablet Commonly known as: Zofran ODT Allow 1-2 tablets to dissolve in your mouth every 8 hours as needed for nausea/vomiting   tiZANidine 4 MG tablet Commonly known as: Zanaflex Take 1 tablet (4 mg total) by mouth 3 (three) times daily.      Lajean Manes, CNM 12/12/19, 10:43 PM

## 2019-12-13 LAB — GC/CHLAMYDIA PROBE AMP (~~LOC~~) NOT AT ARMC
Chlamydia: NEGATIVE
Comment: NEGATIVE
Comment: NORMAL
Neisseria Gonorrhea: NEGATIVE

## 2019-12-15 ENCOUNTER — Inpatient Hospital Stay (HOSPITAL_COMMUNITY): Payer: 59

## 2019-12-15 ENCOUNTER — Inpatient Hospital Stay (HOSPITAL_COMMUNITY)
Admission: AD | Admit: 2019-12-15 | Discharge: 2019-12-15 | Disposition: A | Discharge status: Home / Self Care | Payer: 59 | Attending: Obstetrics and Gynecology | Admitting: Obstetrics and Gynecology

## 2019-12-15 ENCOUNTER — Other Ambulatory Visit: Payer: Self-pay

## 2019-12-15 DIAGNOSIS — R1031 Right lower quadrant pain: Secondary | ICD-10-CM | POA: Insufficient documentation

## 2019-12-15 DIAGNOSIS — O2340 Unspecified infection of urinary tract in pregnancy, unspecified trimester: Secondary | ICD-10-CM

## 2019-12-15 DIAGNOSIS — O99891 Other specified diseases and conditions complicating pregnancy: Secondary | ICD-10-CM | POA: Diagnosis not present

## 2019-12-15 DIAGNOSIS — Z792 Long term (current) use of antibiotics: Secondary | ICD-10-CM | POA: Insufficient documentation

## 2019-12-15 DIAGNOSIS — Z3A01 Less than 8 weeks gestation of pregnancy: Secondary | ICD-10-CM

## 2019-12-15 DIAGNOSIS — Z79899 Other long term (current) drug therapy: Secondary | ICD-10-CM | POA: Diagnosis not present

## 2019-12-15 DIAGNOSIS — O2341 Unspecified infection of urinary tract in pregnancy, first trimester: Secondary | ICD-10-CM | POA: Diagnosis not present

## 2019-12-15 DIAGNOSIS — O219 Vomiting of pregnancy, unspecified: Secondary | ICD-10-CM | POA: Diagnosis not present

## 2019-12-15 DIAGNOSIS — Z3490 Encounter for supervision of normal pregnancy, unspecified, unspecified trimester: Secondary | ICD-10-CM

## 2019-12-15 LAB — HCG, QUANTITATIVE, PREGNANCY: hCG, Beta Chain, Quant, S: 2754 m[IU]/mL — ABNORMAL HIGH (ref <5)

## 2019-12-15 NOTE — MAU Note (Signed)
.   Gina Farmer is a 34 y.o. at [redacted]w[redacted]d here in MAU reporting: she is here for repeat lab work. Denies any vaginal bleeding, states she is still having pain in her right lower abdomen LMP: 11/05/19 Onset of complaint: ongoing Pain score: 9 Vitals:   12/15/19 1548  BP: 120/84  Pulse: 83  Resp: 18  Temp: 98 F (36.7 C)  SpO2: 100%     FHT: Lab orders placed from triage: [facility]

## 2019-12-15 NOTE — Discharge Instructions (Signed)
Miscarriage A miscarriage is the loss of an unborn baby (fetus) before the 20th week of pregnancy. Most miscarriages happen during the first 3 months of pregnancy. Sometimes, a miscarriage can happen before a woman knows that she is pregnant. Having a miscarriage can be an emotional experience. If you have had a miscarriage, talk with your health care provider about any questions you may have about miscarrying, the grieving process, and your plans for future pregnancy. What are the causes? A miscarriage may be caused by:  Problems with the genes or chromosomes of the fetus. These problems make it impossible for the baby to develop normally. They are often the result of random errors that occur early in the development of the baby, and are not passed from parent to child (not inherited).  Infection of the cervix or uterus.  Conditions that affect hormone balance in the body.  Problems with the cervix, such as the cervix opening and thinning before pregnancy is at term (cervical insufficiency).  Problems with the uterus. These may include: ? A uterus with an abnormal shape. ? Fibroids in the uterus. ? Congenital abnormalities. These are problems that were present at birth.  Certain medical conditions.  Smoking, drinking alcohol, or using drugs.  Injury (trauma). In many cases, the cause of a miscarriage is not known. What are the signs or symptoms? Symptoms of this condition include:  Vaginal bleeding or spotting, with or without cramps or pain.  Pain or cramping in the abdomen or lower back.  Passing fluid, tissue, or blood clots from the vagina. How is this diagnosed? This condition may be diagnosed based on:  A physical exam.  Ultrasound.  Blood tests.  Urine tests. How is this treated? Treatment for a miscarriage is sometimes not necessary if you naturally pass all the tissue that was in your uterus. If necessary, this condition may be treated with:  Dilation and  curettage (D&C). This is a procedure in which the cervix is stretched open and the lining of the uterus (endometrium) is scraped. This is done only if tissue from the fetus or placenta remains in the body (incomplete miscarriage).  Medicines, such as: ? Antibiotic medicine, to treat infection. ? Medicine to help the body pass any remaining tissue. ? Medicine to reduce (contract) the size of the uterus. These medicines may be given if you have a lot of bleeding. If you have Rh negative blood and your baby was Rh positive, you will need a shot of a medicine called Rh immunoglobulinto protect your future babies from Rh blood problems. "Rh-negative" and "Rh-positive" refer to whether or not the blood has a specific protein found on the surface of red blood cells (Rh factor). Follow these instructions at home: Medicines   Take over-the-counter and prescription medicines only as told by your health care provider.  If you were prescribed antibiotic medicine, take it as told by your health care provider. Do not stop taking the antibiotic even if you start to feel better.  Do not take NSAIDs, such as aspirin and ibuprofen, unless they are approved by your health care provider. These medicines can cause bleeding. Activity  Rest as directed. Ask your health care provider what activities are safe for you.  Have someone help with home and family responsibilities during this time. General instructions  Keep track of the number of sanitary pads you use each day and how soaked (saturated) they are. Write down this information.  Monitor the amount of tissue or blood clots that   you pass from your vagina. Save any large amounts of tissue for your health care provider to examine.  Do not use tampons, douche, or have sex until your health care provider approves.  To help you and your partner with the process of grieving, talk with your health care provider or seek counseling.  When you are ready, meet with  your health care provider to discuss any important steps you should take for your health. Also, discuss steps you should take to have a healthy pregnancy in the future.  Keep all follow-up visits as told by your health care provider. This is important. Where to find more information  The American Congress of Obstetricians and Gynecologists: www.acog.org  [place] Department of Health and Cytogeneticist of Women's Health: http://hoffman.com/ Contact a health care provider if:  You have a fever or chills.  You have a foul smelling vaginal discharge.  You have more bleeding instead of less. Get help right away if:  You have severe cramps or pain in your back or abdomen.  You pass blood clots or tissue from your vagina that is walnut-sized or larger.  You soak more than 1 regular sanitary pad in an hour.  You become light-headed or weak.  You pass out.  You have feelings of sadness that take over your thoughts, or you have thoughts of hurting yourself. Summary  Most miscarriages happen in the first 3 months of pregnancy. Sometimes miscarriage happens before a woman even knows that she is pregnant.  Follow your health care provider's instruction for home care. Keep all follow-up appointments.  To help you and your partner with the process of grieving, talk with your health care provider or seek counseling. This information is not intended to replace advice given to you by your health care provider. Make sure you discuss any questions you have with your health care provider. Document Revised: 03/24/2019 Document Reviewed: 01/05/2017 Elsevier Patient Education  2020 Elsevier Inc. Hyperemesis Gravidarum Hyperemesis gravidarum is a severe form of nausea and vomiting that happens during pregnancy. Hyperemesis is worse than morning sickness. It may cause you to have nausea or vomiting all day for many days. It may keep you from eating and drinking enough food and liquids, which can  lead to dehydration, malnutrition, and weight loss. Hyperemesis usually occurs during the first half (the first 20 weeks) of pregnancy. It often goes away once a woman is in her second half of pregnancy. However, sometimes hyperemesis continues through an entire pregnancy. What are the causes? The cause of this condition is not known. It may be related to changes in chemicals (hormones) in the body during pregnancy, such as the high level of pregnancy hormone (human chorionic gonadotropin) or the increase in the female sex hormone (estrogen). What are the signs or symptoms? Symptoms of this condition include:  Nausea that does not go away.  Vomiting that does not allow you to keep any food down.  Weight loss.  Body fluid loss (dehydration).  Having no desire to eat, or not liking food that you have previously enjoyed. How is this diagnosed? This condition may be diagnosed based on:  A physical exam.  Your medical history.  Your symptoms.  Blood tests.  Urine tests. How is this treated? This condition is managed by controlling symptoms. This may include:  Following an eating plan. This can help lessen nausea and vomiting.  Taking prescription medicines. An eating plan and medicines are often used together to help control symptoms. If medicines  do not help relieve nausea and vomiting, you may need to receive fluids through an IV at the hospital. Follow these instructions at home: Eating and drinking   Avoid the following: ? Drinking fluids with meals. Try not to drink anything during the 30 minutes before and after your meals. ? Drinking more than 1 cup of fluid at a time. ? Eating foods that trigger your symptoms. These may include spicy foods, coffee, high-fat foods, very sweet foods, and acidic foods. ? Skipping meals. Nausea can be more intense on an empty stomach. If you cannot tolerate food, do not force it. Try sucking on ice chips or other frozen items and make up for  missed calories later. ? Lying down within 2 hours after eating. ? Being exposed to environmental triggers. These may include food smells, smoky rooms, closed spaces, rooms with strong smells, warm or humid places, overly loud and noisy rooms, and rooms with motion or flickering lights. Try eating meals in a well-ventilated area that is free of strong smells. ? Quick and sudden changes in your movement. ? Taking iron pills and multivitamins that contain iron. If you take prescription iron pills, do not stop taking them unless your health care provider approves. ? Preparing food. The smell of food can spoil your appetite or trigger nausea.  To help relieve your symptoms: ? Listen to your body. Everyone is different and has different preferences. Find what works best for you. ? Eat and drink slowly. ? Eat 5-6 small meals daily instead of 3 large meals. Eating small meals and snacks can help you avoid an empty stomach. ? In the morning, before getting out of bed, eat a couple of crackers to avoid moving around on an empty stomach. ? Try eating starchy foods as these are usually tolerated well. Examples include cereal, toast, bread, potatoes, pasta, rice, and pretzels. ? Include at least 1 serving of protein with your meals and snacks. Protein options include lean meats, poultry, seafood, beans, nuts, nut butters, eggs, cheese, and yogurt. ? Try eating a protein-rich snack before bed. Examples of a protein-rick snack include cheese and crackers or a peanut butter sandwich made with 1 slice of whole-wheat bread and 1 tsp (5 g) of peanut butter. ? Eat or suck on things that have ginger in them. It may help relieve nausea. Add  tsp ground ginger to hot tea or choose ginger tea. ? Try drinking 100% fruit juice or an electrolyte drink. An electrolyte drink contains sodium, potassium, and chloride. ? Drink fluids that are cold, clear, and carbonated or sour. Examples include lemonade, ginger ale, lemon-lime  soda, ice water, and sparkling water. ? Brush your teeth or use a mouth rinse after meals. ? Talk with your health care provider about starting a supplement of vitamin B6. General instructions  Take over-the-counter and prescription medicines only as told by your health care provider.  Follow instructions from your health care provider about eating or drinking restrictions.  Continue to take your prenatal vitamins as told by your health care provider. If you are having trouble taking your prenatal vitamins, talk with your health care provider about different options.  Keep all follow-up and pre-birth (prenatal) visits as told by your health care provider. This is important. Contact a health care provider if:  You have pain in your abdomen.  You have a severe headache.  You have vision problems.  You are losing weight.  You feel weak or dizzy. Get help right away if:  You cannot drink fluids without vomiting.  You vomit blood.  You have constant nausea and vomiting.  You are very weak.  You faint.  You have a fever and your symptoms suddenly get worse. Summary  Hyperemesis gravidarum is a severe form of nausea and vomiting that happens during pregnancy.  Making some changes to your eating habits may help relieve nausea and vomiting.  This condition may be managed with medicine.  If medicines do not help relieve nausea and vomiting, you may need to receive fluids through an IV at the hospital. This information is not intended to replace advice given to you by your health care provider. Make sure you discuss any questions you have with your health care provider. Document Revised: 12/20/2017 Document Reviewed: 07/29/2016 Elsevier Patient Education  2020 Elsevier Inc. Morning Sickness  Morning sickness is when a woman feels nauseous during pregnancy. This nauseous feeling may or may not come with vomiting. It often occurs in the morning, but it can be a problem at any  time of day. Morning sickness is most common during the first trimester. In some cases, it may continue throughout pregnancy. Although morning sickness is unpleasant, it is usually harmless unless the woman develops severe and continual vomiting (hyperemesis gravidarum), a condition that requires more intense treatment. What are the causes? The exact cause of this condition is not known, but it seems to be related to normal hormonal changes that occur in pregnancy. What increases the risk? You are more likely to develop this condition if:  You experienced nausea or vomiting before your pregnancy.  You had morning sickness during a previous pregnancy.  You are pregnant with more than one baby, such as twins. What are the signs or symptoms? Symptoms of this condition include:  Nausea.  Vomiting. How is this diagnosed? This condition is usually diagnosed based on your signs and symptoms. How is this treated? In many cases, treatment is not needed for this condition. Making some changes to what you eat may help to control symptoms. Your health care provider may also prescribe or recommend:  Vitamin B6 supplements.  Anti-nausea medicines.  Ginger. Follow these instructions at home: Medicines  Take over-the-counter and prescription medicines only as told by your health care provider. Do not use any prescription, over-the-counter, or herbal medicines for morning sickness without first talking with your health care provider.  Taking multivitamins before getting pregnant can prevent or decrease the severity of morning sickness in most women. Eating and drinking  Eat a piece of dry toast or crackers before getting out of bed in the morning.  Eat 5 or 6 small meals a day.  Eat dry and bland foods, such as rice or a baked potato. Foods that are high in carbohydrates are often helpful.  Avoid greasy, fatty, and spicy foods.  Have someone cook for you if the smell of any food causes  nausea and vomiting.  If you feel nauseous after taking prenatal vitamins, take the vitamins at night or with a snack.  Snack on protein foods between meals if you are hungry. Nuts, yogurt, and cheese are good options.  Drink fluids throughout the day.  Try ginger ale made with real ginger, ginger tea made from fresh grated ginger, or ginger candies. General instructions  Do not use any products that contain nicotine or tobacco, such as cigarettes and e-cigarettes. If you need help quitting, ask your health care provider.  Get an air purifier to keep the air in your house free  of odors.  Get plenty of fresh air.  Try to avoid odors that trigger your nausea.  Consider trying these methods to help relieve symptoms: ? Wearing an acupressure wristband. These wristbands are often worn for seasickness. ? Acupuncture. Contact a health care provider if:  Your home remedies are not working and you need medicine.  You feel dizzy or light-headed.  You are losing weight. Get help right away if:  You have persistent and uncontrolled nausea and vomiting.  You faint.  You have severe pain in your abdomen. Summary  Morning sickness is when a woman feels nauseous during pregnancy. This nauseous feeling may or may not come with vomiting.  Morning sickness is most common during the first trimester.  It often occurs in the morning, but it can be a problem at any time of day.  In many cases, treatment is not needed for this condition. Making some changes to what you eat may help to control symptoms. This information is not intended to replace advice given to you by your health care provider. Make sure you discuss any questions you have with your health care provider. Document Revised: 11/12/2017 Document Reviewed: 01/02/2017 Elsevier Patient Education  [place].                  Safe Medications in Pregnancy    Acne: Benzoyl Peroxide Salicylic  Acid  Backache/Headache: Tylenol: 2 regular strength every 4 hours OR              2 Extra strength every 6 hours  Colds/Coughs/Allergies: Benadryl (alcohol free) 25 mg every 6 hours as needed Breath right strips Claritin Cepacol throat lozenges Chloraseptic throat spray Cold-Eeze- up to three times per day Cough drops, alcohol free Flonase (by prescription only) Guaifenesin Mucinex Robitussin DM (plain only, alcohol free) Saline nasal spray/drops Sudafed (pseudoephedrine) & Actifed ** use only after [redacted] weeks gestation and if you do not have high blood pressure Tylenol Vicks Vaporub Zinc lozenges Zyrtec   Constipation: Colace Ducolax suppositories Fleet enema Glycerin suppositories Metamucil Milk of magnesia Miralax Senokot Smooth move tea  Diarrhea: Kaopectate Imodium A-D  *NO pepto Bismol  Hemorrhoids: Anusol Anusol HC Preparation H Tucks  Indigestion: Tums Maalox Mylanta Zantac  Pepcid  Insomnia: Benadryl (alcohol free) 25mg  every 6 hours as needed Tylenol PM Unisom, no Gelcaps  Leg Cramps: Tums MagGel  Nausea/Vomiting:  Bonine Dramamine Emetrol Ginger extract Sea bands Meclizine  Nausea medication to take during pregnancy:  Unisom (doxylamine succinate 25 mg tablets) Take one tablet daily at bedtime. If symptoms are not adequately controlled, the dose can be increased to a maximum recommended dose of two tablets daily (1/2 tablet in the morning, 1/2 tablet mid-afternoon and one at bedtime). Vitamin B6 100mg  tablets. Take one tablet twice a day (up to 200 mg per day).  Skin Rashes: Aveeno products Benadryl cream or 25mg  every 6 hours as needed Calamine Lotion 1% cortisone cream  Yeast infection: Gyne-lotrimin 7 Monistat 7   **If taking multiple medications, please check labels to avoid duplicating the same active ingredients **take medication as directed on the label ** Do not exceed 4000 mg of tylenol in 24 hours **Do not  take medications that contain aspirin or ibuprofen    [facility]     Phone: [place] for [employer] at [place]  Phone: [place] for [employer] at [place]  Phone: [place] [facility]                           Phone: [phone]  [facility]          Phone: [phone]  [facility]    Phone: [phone]   [facility] [place]    Phone: [phone]  [facility]    Phone: [phone]  [facility]    Phone: [phone]  [facility]    Phone: [phone]  [facility]  Phone: [phone]  [facility]               Phone: [phone]  [facility]   Phone: [phone]  [facility]    Phone: [phone]                   Abdominal Pain During Pregnancy  Abdominal pain is common during pregnancy, and has many possible causes. Some causes are more serious than others, and sometimes the cause is not known. Abdominal pain can be a sign that labor is starting. It can also be caused by normal growth and stretching of muscles and ligaments during pregnancy. Always tell your health care provider if you have any abdominal pain. Follow these instructions at home:  Do not have sex or put anything in your vagina until your pain goes away completely.  Get plenty of rest until your pain improves.  Drink enough fluid to keep your urine pale yellow.  Take over-the-counter and prescription medicines only as told by your health care provider.  Keep all follow-up visits as told by your health care provider. This is important. Contact a health care provider if:  Your pain continues or gets worse after  resting.  You have lower abdominal pain that: ? Comes and goes at regular intervals. ? Spreads to your back. ? Is similar to menstrual cramps.  You have pain or burning when you urinate. Get help right away if:  You have a fever or chills.  You have vaginal bleeding.  You are leaking fluid from your vagina.  You are passing tissue from your vagina.  You have vomiting or diarrhea that lasts for more than 24 hours.  Your baby is moving less than usual.  You feel very weak or faint.  You have shortness of breath.  You develop severe pain in your upper abdomen. Summary  Abdominal pain is common during pregnancy, and has many possible causes.  If you experience abdominal pain during pregnancy, tell your health care provider right away.  Follow your health care provider's home care instructions and keep all follow-up visits as directed. This information is not intended to replace advice given to you by your health care provider. Make sure you discuss any questions you have with your health care provider. Document Revised: 03/20/2019 Document Reviewed: 03/04/2017 Elsevier Patient Education  2020 [employer]  First Trimester of Pregnancy The first trimester of pregnancy is from week 1 until the end of week 13 (months 1 through 3). A week after a sperm fertilizes an egg, the egg will implant on the wall of the uterus. This embryo will begin to develop into a baby. Genes from you and your partner will form the baby. The female genes will determine whether the baby will be a boy or a girl. At 6-8 weeks, the eyes  and face will be formed, and the heartbeat can be seen on ultrasound. At the end of 12 weeks, all the baby's organs will be formed. Now that you are pregnant, you will want to do everything you can to have a healthy baby. Two of the most important things are to get good prenatal care and to follow your health care provider's instructions. Prenatal care is all the medical care  you receive before the baby's birth. This care will help prevent, find, and treat any problems during the pregnancy and childbirth. Body changes during your first trimester Your body goes through many changes during pregnancy. The changes vary from woman to woman.  You may gain or lose a couple of pounds at first.  You may feel sick to your stomach (nauseous) and you may throw up (vomit). If the vomiting is uncontrollable, call your health care provider.  You may tire easily.  You may develop headaches that can be relieved by medicines. All medicines should be approved by your health care provider.  You may urinate more often. Painful urination may mean you have a bladder infection.  You may develop heartburn as a result of your pregnancy.  You may develop constipation because certain hormones are causing the muscles that push stool through your intestines to slow down.  You may develop hemorrhoids or swollen veins (varicose veins).  Your breasts may begin to grow larger and become tender. Your nipples may stick out more, and the tissue that surrounds them (areola) may become darker.  Your gums may bleed and may be sensitive to brushing and flossing.  Dark spots or blotches (chloasma, mask of pregnancy) may develop on your face. This will likely fade after the baby is born.  Your menstrual periods will stop.  You may have a loss of appetite.  You may develop cravings for certain kinds of food.  You may have changes in your emotions from day to day, such as being excited to be pregnant or being concerned that something may go wrong with the pregnancy and baby.  You may have more vivid and strange dreams.  You may have changes in your hair. These can include thickening of your hair, rapid growth, and changes in texture. Some women also have hair loss during or after pregnancy, or hair that feels dry or thin. Your hair will most likely return to normal after your baby is born. What  to expect at prenatal visits During a routine prenatal visit:  You will be weighed to make sure you and the baby are growing normally.  Your blood pressure will be taken.  Your abdomen will be measured to track your baby's growth.  The fetal heartbeat will be listened to between weeks 10 and 14 of your pregnancy.  Test results from any previous visits will be discussed. Your health care provider may ask you:  How you are feeling.  If you are feeling the baby move.  If you have had any abnormal symptoms, such as leaking fluid, bleeding, severe headaches, or abdominal cramping.  If you are using any tobacco products, including cigarettes, chewing tobacco, and electronic cigarettes.  If you have any questions. Other tests that may be performed during your first trimester include:  Blood tests to find your blood type and to check for the presence of any previous infections. The tests will also be used to check for low iron levels (anemia) and protein on red blood cells (Rh antibodies). Depending on your risk factors, or if  you previously had diabetes during pregnancy, you may have tests to check for high blood sugar that affects pregnant women (gestational diabetes).  Urine tests to check for infections, diabetes, or protein in the urine.  An ultrasound to confirm the proper growth and development of the baby.  Fetal screens for spinal cord problems (spina bifida) and Down syndrome.  HIV (human immunodeficiency virus) testing. Routine prenatal testing includes screening for HIV, unless you choose not to have this test.  You may need other tests to make sure you and the baby are doing well. Follow these instructions at home: Medicines  Follow your health care provider's instructions regarding medicine use. Specific medicines may be either safe or unsafe to take during pregnancy.  Take a prenatal vitamin that contains at least 600 micrograms (mcg) of folic acid.  If you develop  constipation, try taking a stool softener if your health care provider approves. Eating and drinking   Eat a balanced diet that includes fresh fruits and vegetables, whole grains, good sources of protein such as meat, eggs, or tofu, and low-fat dairy. Your health care provider will help you determine the amount of weight gain that is right for you.  Avoid raw meat and uncooked cheese. These carry germs that can cause birth defects in the baby.  Eating four or five small meals rather than three large meals a day may help relieve nausea and vomiting. If you start to feel nauseous, eating a few soda crackers can be helpful. Drinking liquids between meals, instead of during meals, also seems to help ease nausea and vomiting.  Limit foods that are high in fat and processed sugars, such as fried and sweet foods.  To prevent constipation: ? Eat foods that are high in fiber, such as fresh fruits and vegetables, whole grains, and beans. ? Drink enough fluid to keep your urine clear or pale yellow. Activity  Exercise only as directed by your health care provider. Most women can continue their usual exercise routine during pregnancy. Try to exercise for 30 minutes at least 5 days a week. Exercising will help you: ? Control your weight. ? Stay in shape. ? Be prepared for labor and delivery.  Experiencing pain or cramping in the lower abdomen or lower back is a good sign that you should stop exercising. Check with your health care provider before continuing with normal exercises.  Try to avoid standing for long periods of time. Move your legs often if you must stand in one place for a long time.  Avoid heavy lifting.  Wear low-heeled shoes and practice good posture.  You may continue to have sex unless your health care provider tells you not to. Relieving pain and discomfort  Wear a good support bra to relieve breast tenderness.  Take warm sitz baths to soothe any pain or discomfort caused by  hemorrhoids. Use hemorrhoid cream if your health care provider approves.  Rest with your legs elevated if you have leg cramps or low back pain.  If you develop varicose veins in your legs, wear support hose. Elevate your feet for 15 minutes, 3-4 times a day. Limit salt in your diet. Prenatal care  Schedule your prenatal visits by the twelfth week of pregnancy. They are usually scheduled monthly at first, then more often in the last 2 months before delivery.  Write down your questions. Take them to your prenatal visits.  Keep all your prenatal visits as told by your health care provider. This is important. Safety  Wear your seat belt at all times when driving.  Make a list of emergency phone numbers, including numbers for family, friends, the hospital, and police and fire departments. General instructions  Ask your health care provider for a referral to a local prenatal education class. Begin classes no later than the beginning of month 6 of your pregnancy.  Ask for help if you have counseling or nutritional needs during pregnancy. Your health care provider can offer advice or refer you to specialists for help with various needs.  Do not use hot tubs, steam rooms, or saunas.  Do not douche or use tampons or scented sanitary pads.  Do not cross your legs for long periods of time.  Avoid cat litter boxes and soil used by cats. These carry germs that can cause birth defects in the baby and possibly loss of the fetus by miscarriage or stillbirth.  Avoid all smoking, herbs, alcohol, and medicines not prescribed by your health care provider. Chemicals in these products affect the formation and growth of the baby.  Do not use any products that contain nicotine or tobacco, such as cigarettes and e-cigarettes. If you need help quitting, ask your health care provider. You may receive counseling support and other resources to help you quit.  Schedule a dentist appointment. At home, brush your  teeth with a soft toothbrush and be gentle when you floss. Contact a health care provider if:  You have dizziness.  You have mild pelvic cramps, pelvic pressure, or nagging pain in the abdominal area.  You have persistent nausea, vomiting, or diarrhea.  You have a bad smelling vaginal discharge.  You have pain when you urinate.  You notice increased swelling in your face, hands, legs, or ankles.  You are exposed to fifth disease or chickenpox.  You are exposed to [place] measles (rubella) and have never had it. Get help right away if:  You have a fever.  You are leaking fluid from your vagina.  You have spotting or bleeding from your vagina.  You have severe abdominal cramping or pain.  You have rapid weight gain or loss.  You vomit blood or material that looks like coffee grounds.  You develop a severe headache.  You have shortness of breath.  You have any kind of trauma, such as from a fall or a car accident. Summary  The first trimester of pregnancy is from week 1 until the end of week 13 (months 1 through 3).  Your body goes through many changes during pregnancy. The changes vary from woman to woman.  You will have routine prenatal visits. During those visits, your health care provider will examine you, discuss any test results you may have, and talk with you about how you are feeling. This information is not intended to replace advice given to you by your health care provider. Make sure you discuss any questions you have with your health care provider. Document Revised: 11/12/2017 Document Reviewed: 11/11/2016 Elsevier Patient Education  2020 [employer].

## 2019-12-15 NOTE — MAU Provider Note (Signed)
Subjective:  Gina Farmer is a 34 y.o. G2P0010 at [redacted]w[redacted]d who presents today for FU BHCG. She was seen on 12/12/2019. Results from that day show no IUP on [place], and HCG 1,683. She denies vaginal bleeding. She reports abdominal or pelvic pain that she rates as 9/10. Pt reports she did experience this level of "uterine pain" in her previous pregnancy, but the right-sided "ovarian pain" did not happen previously.  Pt does report that she went to [facility] on 12/08/2019 for dysuria and was found to have a UTI. Pt was prescribed antibiotics at that visit, but reports that she did not start them until several days later and is still currently taking them.  Pt also requests refill on her prescription for Zofran that she reports she was given at [facility] during her recent visit. Pt reports constant nausea, but denies vomiting.  Objective:  Physical Exam  Nursing note and vitals reviewed.  Patient Vitals for the past 24 hrs:  BP Temp Pulse Resp SpO2  12/15/19 1548 120/84 98 F (36.7 C) 83 18 100 %   Constitutional: She is oriented to person, place, and time. She appears well-developed and well-nourished. No distress.  HENT:  Head: Normocephalic.  Cardiovascular: Normal rate.  Respiratory: Effort normal.  GI: Soft. There is no tenderness.  Neurological: She is alert and oriented to person, place, and time. Skin: Skin is warm and dry.  Psychiatric: She has a normal mood and affect.   Results for orders placed or performed during the hospital encounter of 12/15/19 (from the past 24 hour(s))  hCG, quantitative, pregnancy     Status: Abnormal   Collection Time: 12/15/19  3:52 PM  Result Value Ref Range   hCG, Beta Chain, Quant, S 2,754 (H) <5 mIU/mL   Assessment/Plan:  1. Intrauterine pregnancy   2. RLQ abdominal pain   3. [redacted] weeks gestation of pregnancy   4. Urinary tract infection in pregnancy, antepartum   5. Nausea and vomiting in pregnancy    Unable to determine  if hCG rose appropriately as it has been almost 72hrs since previous hCG was drawn and time interval is too long for appropriate comparison. [place]: +yolk sac Discussed with patient that UTI may be contributing to pain and patient was advised to continue taking antibiotics as prescribed, with the exception of use for 7 days only as opposed to 12 days Safe meds in pregnancy list give with focus on antiemetics, pt encouraged to try B6 and Unisom, strict hyperemesis precautions given, pt advised that we will not refill Zofran today d/t possible malformations with use in early pregnancy Discussed normal expectations for N/V in pregnancy FU in 10-14 days for: repeat US Pt to schedule NOB visit, OB providers list given Strict bleeding/pain/SAB/return MAU precautions given Pt questions extensively asked and answered Pt discharged to home in stable condition

## 2019-12-21 ENCOUNTER — Other Ambulatory Visit: Payer: Self-pay

## 2019-12-21 ENCOUNTER — Inpatient Hospital Stay
Admission: EM | Admit: 2019-12-21 | Discharge: 2019-12-22 | DRG: 833 | Disposition: A | Discharge status: Home / Self Care | Payer: 59 | Attending: Obstetrics and Gynecology | Admitting: Obstetrics and Gynecology

## 2019-12-21 ENCOUNTER — Emergency Department: Payer: 59

## 2019-12-21 ENCOUNTER — Encounter: Payer: Self-pay | Admitting: Emergency Medicine

## 2019-12-21 DIAGNOSIS — R102 Pelvic and perineal pain: Secondary | ICD-10-CM

## 2019-12-21 DIAGNOSIS — O99331 Smoking (tobacco) complicating pregnancy, first trimester: Secondary | ICD-10-CM | POA: Diagnosis present

## 2019-12-21 DIAGNOSIS — Z20822 Contact with and (suspected) exposure to covid-19: Secondary | ICD-10-CM | POA: Diagnosis present

## 2019-12-21 DIAGNOSIS — F17210 Nicotine dependence, cigarettes, uncomplicated: Secondary | ICD-10-CM | POA: Diagnosis present

## 2019-12-21 DIAGNOSIS — O99211 Obesity complicating pregnancy, first trimester: Secondary | ICD-10-CM | POA: Diagnosis present

## 2019-12-21 DIAGNOSIS — O23591 Infection of other part of genital tract in pregnancy, first trimester: Principal | ICD-10-CM | POA: Diagnosis present

## 2019-12-21 DIAGNOSIS — R109 Unspecified abdominal pain: Secondary | ICD-10-CM | POA: Diagnosis present

## 2019-12-21 DIAGNOSIS — Z3A01 Less than 8 weeks gestation of pregnancy: Secondary | ICD-10-CM

## 2019-12-21 DIAGNOSIS — O26899 Other specified pregnancy related conditions, unspecified trimester: Secondary | ICD-10-CM

## 2019-12-21 HISTORY — DX: Post-traumatic stress disorder, unspecified: F43.10

## 2019-12-21 LAB — WET PREP, GENITAL
Clue Cells Wet Prep HPF POC: NONE SEEN
Sperm: NONE SEEN
Trich, Wet Prep: NONE SEEN
Yeast Wet Prep HPF POC: NONE SEEN

## 2019-12-21 LAB — COMPREHENSIVE METABOLIC PANEL
ALT: 20 U/L (ref 0–44)
AST: 17 U/L (ref 15–41)
Albumin: 3.9 g/dL (ref 3.5–5.0)
Alkaline Phosphatase: 63 U/L (ref 38–126)
Anion gap: 12 (ref 5–15)
BUN: 19 mg/dL (ref 6–20)
CO2: 22 mmol/L (ref 22–32)
Calcium: 9.2 mg/dL (ref 8.9–10.3)
Chloride: 102 mmol/L (ref 98–111)
Creatinine, Ser: 0.71 mg/dL (ref 0.44–1.00)
GFR calc Af Amer: >60 mL/min (ref >60)
GFR calc non Af Amer: >60 mL/min (ref >60)
Glucose, Bld: 98 mg/dL (ref 70–99)
Potassium: 4.2 mmol/L (ref 3.5–5.1)
Sodium: 136 mmol/L (ref 135–145)
Total Bilirubin: 0.5 mg/dL (ref 0.3–1.2)
Total Protein: 7.4 g/dL (ref 6.5–8.1)

## 2019-12-21 LAB — CBC
HCT: 44.2 % (ref 36.0–46.0)
Hemoglobin: 14.4 g/dL (ref 12.0–15.0)
MCH: 29.3 pg (ref 26.0–34.0)
MCHC: 32.6 g/dL (ref 30.0–36.0)
MCV: 90 fL (ref 80.0–100.0)
Platelets: 329 10*3/uL (ref 150–400)
RBC: 4.91 MIL/uL (ref 3.87–5.11)
RDW: 13.2 % (ref 11.5–15.5)
WBC: 18.4 10*3/uL — ABNORMAL HIGH (ref 4.0–10.5)
nRBC: 0 % (ref 0.0–0.2)

## 2019-12-21 LAB — LIPASE, BLOOD: Lipase: 26 U/L (ref 11–51)

## 2019-12-21 LAB — URINALYSIS, COMPLETE (UACMP) WITH MICROSCOPIC
Bilirubin Urine: NEGATIVE
Glucose, UA: NEGATIVE mg/dL
Ketones, ur: NEGATIVE mg/dL
Leukocytes,Ua: NEGATIVE
Nitrite: NEGATIVE
Protein, ur: NEGATIVE mg/dL
Specific Gravity, Urine: 1.021 (ref 1.005–1.030)
pH: 5 (ref 5.0–8.0)

## 2019-12-21 LAB — RESPIRATORY PANEL BY RT PCR (FLU A&B, COVID)
Influenza A by PCR: NEGATIVE
Influenza B by PCR: NEGATIVE
SARS Coronavirus 2 by RT PCR: NEGATIVE

## 2019-12-21 LAB — HEPATITIS PANEL, ACUTE
HCV Ab: NONREACTIVE
Hep A IgM: NONREACTIVE
Hep B C IgM: NONREACTIVE
Hepatitis B Surface Ag: NONREACTIVE

## 2019-12-21 LAB — HIV ANTIBODY (ROUTINE TESTING W REFLEX): HIV Screen 4th Generation wRfx: NONREACTIVE

## 2019-12-21 LAB — HCG, QUANTITATIVE, PREGNANCY: hCG, Beta Chain, Quant, S: 4956 m[IU]/mL — ABNORMAL HIGH (ref <5)

## 2019-12-21 MED ORDER — ACETAMINOPHEN 650 MG RE SUPP: 650.0000 mg | Freq: Four times a day (QID) | RECTAL | Status: DC | PRN

## 2019-12-21 MED ORDER — ONDANSETRON HCL 4 MG/2ML IJ SOLN
4.0000 mg | Freq: Once | INTRAMUSCULAR | Status: AC
  Administered 2019-12-21: 4 mg via INTRAVENOUS
  Filled 2019-12-21: qty 2

## 2019-12-21 MED ORDER — MORPHINE SULFATE (PF) 4 MG/ML IV SOLN
4.0000 mg | Freq: Once | INTRAVENOUS | Status: AC
  Administered 2019-12-21: 4 mg via INTRAVENOUS

## 2019-12-21 MED ORDER — HYDROXYZINE HCL 10 MG PO TABS
10.0000 mg | ORAL_TABLET | Freq: Four times a day (QID) | ORAL | Status: DC | PRN
  Administered 2019-12-21: 10 mg via ORAL
  Filled 2019-12-21 (×2): qty 1

## 2019-12-21 MED ORDER — SODIUM CHLORIDE 0.9 % IV SOLN: Freq: Once | INTRAVENOUS | Status: AC

## 2019-12-21 MED ORDER — PRENATAL MULTIVITAMIN CH
1.0000 | ORAL_TABLET | Freq: Every day | ORAL | Status: DC
  Filled 2019-12-21: qty 1

## 2019-12-21 MED ORDER — ONDANSETRON HCL 4 MG/2ML IJ SOLN: 4.0000 mg | Freq: Four times a day (QID) | INTRAMUSCULAR | Status: DC | PRN

## 2019-12-21 MED ORDER — AZITHROMYCIN 1 G PO PACK
1.0000 g | PACK | Freq: Once | ORAL | Status: AC
  Administered 2019-12-21: 1 g via ORAL
  Filled 2019-12-21: qty 1

## 2019-12-21 MED ORDER — ONDANSETRON HCL 4 MG PO TABS: 4.0000 mg | ORAL_TABLET | Freq: Four times a day (QID) | ORAL | Status: DC | PRN

## 2019-12-21 MED ORDER — LACTATED RINGERS IV SOLN: INTRAVENOUS | Status: DC

## 2019-12-21 MED ORDER — METRONIDAZOLE IN NACL 5-0.79 MG/ML-% IV SOLN
500.0000 mg | Freq: Three times a day (TID) | INTRAVENOUS | Status: DC
  Administered 2019-12-21 – 2019-12-22 (×3): 500 mg via INTRAVENOUS
  Filled 2019-12-21 (×7): qty 100

## 2019-12-21 MED ORDER — SODIUM CHLORIDE 0.9 % IV SOLN
2.0000 g | INTRAVENOUS | Status: DC
  Administered 2019-12-21: 2 g via INTRAVENOUS
  Filled 2019-12-21: qty 20

## 2019-12-21 MED ORDER — DOCUSATE SODIUM 100 MG PO CAPS
100.0000 mg | ORAL_CAPSULE | Freq: Two times a day (BID) | ORAL | Status: DC
  Filled 2019-12-21: qty 1

## 2019-12-21 MED ORDER — MORPHINE SULFATE (PF) 4 MG/ML IV SOLN
4.0000 mg | INTRAVENOUS | Status: DC | PRN
  Administered 2019-12-21 – 2019-12-22 (×2): 4 mg via INTRAVENOUS
  Filled 2019-12-21: qty 1

## 2019-12-21 MED ORDER — SODIUM CHLORIDE 0.9 % IV SOLN
2.0000 g | Freq: Two times a day (BID) | INTRAVENOUS | Status: DC
  Administered 2019-12-21 – 2019-12-22 (×3): 2 g via INTRAVENOUS
  Filled 2019-12-21 (×4): qty 2

## 2019-12-21 MED ORDER — HYDROCODONE-ACETAMINOPHEN 5-325 MG PO TABS
1.0000 | ORAL_TABLET | ORAL | Status: DC | PRN
  Administered 2019-12-21 – 2019-12-22 (×2): 2 via ORAL
  Administered 2019-12-22: 1 via ORAL
  Filled 2019-12-21: qty 1
  Filled 2019-12-21 (×2): qty 2

## 2019-12-21 MED ORDER — ACETAMINOPHEN 325 MG PO TABS: 650.0000 mg | ORAL_TABLET | Freq: Four times a day (QID) | ORAL | Status: DC | PRN

## 2019-12-21 MED ORDER — MORPHINE SULFATE (PF) 4 MG/ML IV SOLN
4.0000 mg | Freq: Once | INTRAVENOUS | Status: DC
  Filled 2019-12-21 (×2): qty 1

## 2019-12-21 MED ORDER — MORPHINE SULFATE (PF) 4 MG/ML IV SOLN
6.0000 mg | Freq: Once | INTRAVENOUS | Status: DC
  Filled 2019-12-21: qty 2

## 2019-12-21 MED ORDER — SODIUM CHLORIDE 0.9 % IV BOLUS
1000.0000 mL | Freq: Once | INTRAVENOUS | Status: AC
  Administered 2019-12-21: 1000 mL via INTRAVENOUS

## 2019-12-21 MED ORDER — POLYETHYLENE GLYCOL 3350 17 G PO PACK
17.0000 g | PACK | Freq: Every day | ORAL | Status: DC | PRN
  Filled 2019-12-21: qty 1

## 2019-12-21 MED ORDER — OXYCODONE-ACETAMINOPHEN 5-325 MG PO TABS
2.0000 | ORAL_TABLET | Freq: Once | ORAL | Status: AC
  Administered 2019-12-21: 2 via ORAL
  Filled 2019-12-21: qty 2

## 2019-12-21 NOTE — ED Notes (Signed)
This RN spoke with [facility] regarding hCG, quantitative results , per Lab tech st results will be ready in 15 minutes.

## 2019-12-21 NOTE — ED Notes (Signed)
Report called to beth rn floor nurse 

## 2019-12-21 NOTE — ED Triage Notes (Signed)
Pt presents to ED with lower abd cramping with nausea. Pt states she is currently being treated for a UTI dx at this hospital 12/25 and started taking her antibiotic the 27th. Pt is also currently [redacted] weeks pregnant and states her abd cramping pain seems to be worsening. Denies vaginal bleeding.

## 2019-12-21 NOTE — ED Provider Notes (Signed)
[facility] Emergency Department Provider Note  ____________________________________________   First MD Initiated Contact with Patient 12/21/19 [id number]     (approximate)  I have reviewed the triage vital signs and the nursing notes.   HISTORY  Chief Complaint Abdominal Pain and Nausea    HPI Gina Farmer is a 34 y.o. female  With h/o morbid obesity, bipolar d/o, currently pregnant here with abd pain. Pt is G2P0010 at estimated [redacted] wk GA here with abd pain. Pt reports that for the past day, she's had progressively worsening aching, cramp like, severe lower abd pain. She describes the pain as an aching, cramping pain that is worse w/ any kind of movement. She's had associated nausea, no vomiting. She denies vaginal bleeding but feels like the pain is "just above" her vagina. It is similar ot the pain she's had intermittently throughout her pregnancy, for which she's had frequent TVUS. She also notes she was diagnosed with trich last week, took Flagyl. She's been on ABX for a UTI as well which she feels like is improving, and hse has no ongoing flank pain. No known fevers. No alleviating factors.       Past Medical History:  Diagnosis Date   Anxiety    Back injury    Bipolar 1 disorder ([place])    Depression    Morbid obesity ([place])    PTSD (post-traumatic stress disorder)     Patient Active Problem List   Diagnosis Date Noted   Positive pregnancy test 12/12/2019   Trichomoniasis 12/12/2019    Past Surgical History:  Procedure Laterality Date   APPENDECTOMY  2009   INTRAOCULAR LENS IMPLANT, SECONDARY     LAPAROSCOPY  2003    Prior to Admission medications   Medication Sig Start Date End Date Taking? Authorizing Provider  benzonatate (TESSALON) 100 MG capsule Take 1-2 capsules (100-200 mg total) by mouth 3 (three) times daily as needed for cough. Patient not taking: Reported on 12/21/2019 09/12/19   McVey, Gelene Mink, PA-C  cephALEXin  (KEFLEX) 500 MG capsule Take 1 capsule (500 mg total) by mouth 4 (four) times daily for 12 days. Patient not taking: Reported on 12/21/2019 12/09/19 12/21/19  Hinda Kehr, MD  fluticasone [facility]) 50 MCG/ACT nasal spray Place 2 sprays into both nostrils daily. 09/12/19   McVey, Gelene Mink, PA-C  hydrOXYzine (ATARAX/VISTARIL) 10 MG tablet Take 1 tablet (10 mg total) by mouth every 6 (six) hours as needed for anxiety. Patient not taking: Reported on 12/21/2019 12/12/19   Lajean Manes, CNM  omeprazole (PRILOSEC OTC) 20 MG tablet Take 1 tablet (20 mg total) by mouth daily. 05/25/17 05/25/18  Hinda Kehr, MD  tiZANidine (ZANAFLEX) 4 MG tablet Take 1 tablet (4 mg total) by mouth 3 (three) times daily. Patient not taking: Reported on 12/21/2019 10/31/19 10/30/20  Victorino Dike, FNP    Allergies Trileptal [oxcarbazepine] and Viibryd [vilazodone hcl]  Family History  Problem Relation Age of Onset   Hypertension Father    Heart disease Father    Narcolepsy Father    Obesity Father    Hypertension Brother    Cancer Maternal Aunt    Obesity Maternal Aunt    Obesity Paternal Aunt    Heart disease Maternal Grandfather     Social History Social History   Tobacco Use   Smoking status: Current Every Day Smoker    Packs/day: 0.50    Types: Cigarettes   Smokeless tobacco: Never Used  Substance Use Topics  Alcohol use: Not Currently    Comment: social   Drug use: Yes    Types: Marijuana    Comment: social    Review of Systems  Review of Systems  Constitutional: Positive for fatigue. Negative for chills and fever.  HENT: Negative for congestion and sore throat.   Eyes: Negative for visual disturbance.  Respiratory: Negative for cough and shortness of breath.   Cardiovascular: Negative for chest pain.  Gastrointestinal: Positive for abdominal pain. Negative for diarrhea, nausea and vomiting.  Genitourinary: Positive for pelvic pain and vaginal pain. Negative for  flank pain.  Musculoskeletal: Negative for back pain and neck pain.  Skin: Negative for rash and wound.  Allergic/Immunologic: Negative for immunocompromised state.  Neurological: Negative for weakness and numbness.  Hematological: Does not bruise/bleed easily.     ____________________________________________  PHYSICAL EXAM:      VITAL SIGNS: ED Triage Vitals  Enc Vitals Group     BP 12/21/19 0531 (!) 145/62     Pulse Rate 12/21/19 0531 83     Resp 12/21/19 0531 16     Temp 12/21/19 0531 98.2 F (36.8 C)     Temp Source 12/21/19 0531 Oral     SpO2 12/21/19 0531 98 %     Weight 12/21/19 0530 281 lb (127.5 kg)     Height 12/21/19 0530 5' 1.5" (1.562 m)     Head Circumference --      Peak Flow --      Pain Score 12/21/19 0539 10     Pain Loc --      Pain Edu? --      Excl. in GC? --      Physical Exam Vitals and nursing note reviewed.  Constitutional:      General: She is not in acute distress.    Appearance: She is well-developed.  HENT:     Head: Normocephalic and atraumatic.  Eyes:     Conjunctiva/sclera: Conjunctivae normal.  Cardiovascular:     Rate and Rhythm: Normal rate and regular rhythm.     Heart sounds: Normal heart sounds. No murmur. No friction rub.  Pulmonary:     Effort: Pulmonary effort is normal. No respiratory distress.     Breath sounds: Normal breath sounds. No wheezing or rales.  Abdominal:     General: There is no distension.     Palpations: Abdomen is soft.     Tenderness: There is abdominal tenderness in the suprapubic area. There is no guarding or rebound.  Genitourinary:    Comments: Moderate white thick vaginal discharge noted. +CMT, with mild R>L adnexal TTP. No bleeding. Musculoskeletal:     Cervical back: Neck supple.  Skin:    General: Skin is warm.     Capillary Refill: Capillary refill takes less than 2 seconds.  Neurological:     Mental Status: She is alert and oriented to person, place, and time.     Motor: No abnormal  muscle tone.       ____________________________________________   LABS (all labs ordered are listed, but only abnormal results are displayed)  Labs Reviewed  WET PREP, GENITAL - Abnormal; Notable for the following components:      Result Value   WBC, Wet Prep HPF POC FEW (*)    All other components within normal limits  CBC - Abnormal; Notable for the following components:   WBC 18.4 (*)    All other components within normal limits  URINALYSIS, COMPLETE (UACMP) WITH MICROSCOPIC - Abnormal; Notable  for the following components:   Color, Urine YELLOW (*)    APPearance CLEAR (*)    Hgb urine dipstick MODERATE (*)    Bacteria, UA RARE (*)    All other components within normal limits  HCG, QUANTITATIVE, PREGNANCY - Abnormal; Notable for the following components:   hCG, Beta Chain, Quant, S 4,956 (*)    All other components within normal limits  RESPIRATORY PANEL BY RT PCR (FLU A&B, COVID)  GC/CHLAMYDIA PROBE AMP  URINE CULTURE  CULTURE, BLOOD (ROUTINE X 2)  CULTURE, BLOOD (ROUTINE X 2)  LIPASE, BLOOD  COMPREHENSIVE METABOLIC PANEL  HIV ANTIBODY (ROUTINE TESTING W REFLEX)  HEPATITIS PANEL, ACUTE  RPR  CBC    ____________________________________________  EKG: None ________________________________________  RADIOLOGY All imaging, including plain films, CT scans, and ultrasounds, independently reviewed by me, and interpretations confirmed via formal radiology reads.  ED MD interpretation:   U/S: IUP, no free fluid, no ectopic  Official radiology report(s): US OB LESS THAN 14 WEEKS WITH OB TRANSVAGINAL  Result Date: 12/21/2019 CLINICAL DATA:  34 year old pregnant female presents with pelvic pain and cramping. Quantitative beta HCG 2,754 on 12/15/2019. EDC by LMP: 08/11/2020, projecting to an expected gestational age of [redacted] weeks 4 days. EXAM: OBSTETRIC <14 WK [place] AND TRANSVAGINAL OB US TECHNIQUE: Both transabdominal and transvaginal ultrasound examinations were performed for  complete evaluation of the gestation as well as the maternal uterus, adnexal regions, and pelvic cul-de-sac. Transvaginal technique was performed to assess early pregnancy. COMPARISON:  12/15/2019 and 12/12/2019 obstetric scans. FINDINGS: Intrauterine gestational sac: Single Yolk sac:  Visualized. Embryo:  Not Visualized. Embryonic Cardiac Activity: Not Visualized. MSD: 12.2 mm 5 w 5 d, compared to 4.8 mm on 12/12/2019 and 6.8 mm on 12/15/2019 scans Subchorionic hemorrhage:  None visualized. Maternal uterus/adnexae: Anteverted uterus. No uterine fibroids demonstrated. Right ovary measures 3.2 x 2.5 x 2.6 cm and contains a corpus luteum. Left ovary measures 2.5 x 1.6 x 2.0 cm. No abnormal ovarian or adnexal masses. No abnormal free fluid the pelvis. IMPRESSION: 1. Single intrauterine gestational sac with yolk sac at 5 weeks 5 days by mean sac diameter. No embryo visualized at this time. The gestational sac continues to enlarge compared to the 12/12/2019 and 12/15/2019 scans. Gestational viability remains indeterminate. Follow-up obstetric scan recommended in 11-14 days, or earlier as clinically warranted. 2. No ovarian or adnexal abnormality. Electronically Signed   By: Delbert Phenix M.D.   On: 12/21/2019 12:09    ____________________________________________  PROCEDURES   Procedure(s) performed (including Critical Care):  Procedures  ____________________________________________  INITIAL IMPRESSION / MDM / ASSESSMENT AND PLAN / ED COURSE  As part of my medical decision making, I reviewed the following data within the electronic MEDICAL RECORD NUMBER Nursing notes reviewed and incorporated, Old chart reviewed, Notes from prior ED visits, and [place] Controlled Substance Database       *NICHOL ATOR was evaluated in Emergency Department on 12/21/2019 for the symptoms described in the history of present illness. She was evaluated in the context of the global COVID-19 pandemic, which necessitated consideration  that the patient might be at risk for infection with the SARS-CoV-2 virus that causes COVID-19. Institutional protocols and algorithms that pertain to the evaluation of patients at risk for COVID-19 are in a state of rapid change based on information released by regulatory bodies including the CDC and federal and state organizations. These policies and algorithms were followed during the patient's care in the ED.  Some ED evaluations and interventions  may be delayed as a result of limited staffing during the pandemic.*  Clinical Course as of Dec 20 1754  Thu Dec 21, 2019  1116 34 yo F here with ongoing pelvic pain during first trimester. Unclear etiology - she has been on ABX for UTI, does endorse some vaginal discharge and exam has slight CMT. Consider PID, and she did have +trich though gc/c neg during last visit. U/S pending. Leukocytosis increased slightly but she is afebrile and otherwise well appearing.   [CI]  1228 U/S shows expected progression of pregnancy. Quant pending. Given her ongoing pain, will d/w OB.    [CI]  1329 Case discussed with Dr. Colin Mulders.    [CI]    Clinical Course User Index [CI] Shaune Pollack, MD    Medical Decision Making:  As above. Given increasing leukocytosis and +CMT with +Trich recently, will need to treat as PID until proven otherwise. IV ABX, admit given pregnant status, worsening pain.  ____________________________________________  FINAL CLINICAL IMPRESSION(S) / ED DIAGNOSES  Final diagnoses:  Abdominal pain affecting pregnancy  Vaginal pain  [redacted] weeks gestation of pregnancy  Pelvic cramping     MEDICATIONS GIVEN DURING THIS VISIT:  Medications  morphine 4 MG/ML injection 4 mg (4 mg Intravenous Not Given 12/21/19 1239)  metroNIDAZOLE (FLAGYL) IVPB 500 mg (has no administration in time range)  cefoTEtan (CEFOTAN) 2 g in sodium chloride 0.9 % 100 mL IVPB (2 g Intravenous New Bag/Given 12/21/19 1511)  morphine 4 MG/ML injection 4 mg (4 mg  Intravenous Given 12/21/19 1746)  lactated ringers infusion (has no administration in time range)  docusate sodium (COLACE) capsule 100 mg (has no administration in time range)  ondansetron (ZOFRAN) tablet 4 mg (has no administration in time range)    Or  ondansetron (ZOFRAN) injection 4 mg (has no administration in time range)  HYDROcodone-acetaminophen (NORCO/VICODIN) 5-325 MG per tablet 1-2 tablet (has no administration in time range)  acetaminophen (TYLENOL) tablet 650 mg (has no administration in time range)    Or  acetaminophen (TYLENOL) suppository 650 mg (has no administration in time range)  polyethylene glycol (MIRALAX / GLYCOLAX) packet 17 g (has no administration in time range)  prenatal vitamin w/FE, FA (NATACHEW) chewable tablet 1 tablet (has no administration in time range)  ondansetron (ZOFRAN) injection 4 mg (4 mg Intravenous Given 12/21/19 1004)  sodium chloride 0.9 % bolus 1,000 mL (0 mLs Intravenous Stopped 12/21/19 1314)  morphine 4 MG/ML injection 4 mg (4 mg Intravenous Given 12/21/19 1010)  azithromycin (ZITHROMAX) powder 1 g (1 g Oral Given 12/21/19 1224)  oxyCODONE-acetaminophen (PERCOCET/ROXICET) 5-325 MG per tablet 2 tablet (2 tablets Oral Given 12/21/19 1237)  0.9 %  sodium chloride infusion ( Intravenous New Bag/Given 12/21/19 1508)     ED Discharge Orders    None       Note:  This document was prepared using Dragon voice recognition software and may include unintentional dictation errors.   Shaune Pollack, MD 12/21/19 1756

## 2019-12-21 NOTE — ED Notes (Signed)
Patient transported to Ultrasound 

## 2019-12-21 NOTE — H&P (Signed)
H&P  Gina Farmer is an 33 y.o. female.  HPI: Gina Farmer presents today for abdominal and pelvic pain. She has had multiple recent visits for these complaints. She  Describes the pain as a sharp pain in uterus, crampy dull initially then yesterday it did not subside and became  more constant. Tylenol was helping before, but did not work overnight. Sleeping on her stomach helps. She is also having hot flashes.Deneis fevers.  She was treated a week ago with flagyl for trichomoniasis.   She has been diagnosed with a IUP at 6 weeks 5 days. She is planning on having a termination. This was not a planned pregnancy. Conceived on oral birth control pills. Has an appointment on Monday at the [facility] clinic.   Last BM: this morning, soft Nasusea x 2 weeks, no vomiting.   Vaginal discharge or odor: none Vaginal bleeding: On the 29th had a small amount red watery blood when wiped.   Pain with urination: Was having some, but resolved. Had some bilateral pain in kidneys as well. Some bladder pressure.   Decreased appetite:yes  OB History LMP: 11/05/2019 12/22/2016 TAB, medication  GYN History Hx of Irregular periods Depo Provera x 11 years Taking OCPs, got pregnant on birth control  Last pap smear: unknown, history of abnormal pap, 10 years ago- Has had a LEEP. - Endometriosis 2003 Dr. Enzo Bi   STD history: Trichomonas, remote hx of chlamydia and gonorrhea.  History of ovarian cysts: Removed by [facility] clinic Denies fibroids. Denies polyps.   Had pain with Mirena insertion   Past Medical History:  Diagnosis Date  . Anxiety   . Back injury   . Bipolar 1 disorder ([place])   . Depression   . Morbid obesity ([place])   . PTSD (post-traumatic stress disorder)     Past Surgical History:  Procedure Laterality Date  . APPENDECTOMY    . INTRAOCULAR LENS IMPLANT, SECONDARY      Family History  Problem Relation Age of Onset  . Hypertension Father   . Heart disease Father   . Narcolepsy  Father   . Obesity Father   . Hypertension Brother   . Cancer Maternal Aunt   . Obesity Maternal Aunt   . Obesity Paternal Aunt   . Heart disease Maternal Grandfather     Social History:  reports that she has been smoking cigarettes. She has been smoking about 0.50 packs per day. She has never used smokeless tobacco. She reports previous alcohol use. She reports current drug use. Drug: Marijuana.  Allergies:  Allergies  Allergen Reactions  . Trileptal [Oxcarbazepine] Swelling  . Viibryd [Vilazodone Hcl] Swelling    Medications: I have reviewed the patient's current medications.  Results for orders placed or performed during the hospital encounter of 12/21/19 (from the past 48 hour(s))  Lipase, blood     Status: None   Collection Time: 12/21/19  5:31 AM  Result Value Ref Range   Lipase 26 11 - 51 U/L    Comment: Performed at [facility], [address] [place],  [address]  Comprehensive metabolic panel     Status: None   Collection Time: 12/21/19  5:31 AM  Result Value Ref Range   Sodium 136 135 - 145 mmol/L   Potassium 4.2 3.5 - 5.1 mmol/L   Chloride 102 98 - 111 mmol/L   CO2 22 22 - 32 mmol/L   Glucose, Bld 98 70 - 99 mg/dL   BUN 19 6 - 20 mg/dL  Creatinine, Ser 0.71 0.44 - 1.00 mg/dL   Calcium 9.2 8.9 - 88.5 mg/dL   Total Protein 7.4 6.5 - 8.1 g/dL   Albumin 3.9 3.5 - 5.0 g/dL   AST 17 15 - 41 U/L   ALT 20 0 - 44 U/L   Alkaline Phosphatase 63 38 - 126 U/L   Total Bilirubin 0.5 0.3 - 1.2 mg/dL   GFR calc non Af Amer >60 >60 mL/min   GFR calc Af Amer >60 >60 mL/min   Anion gap 12 5 - 15    Comment: Performed at [facility], [address] [place], [place] [address]  CBC     Status: Abnormal   Collection Time: 12/21/19  5:31 AM  Result Value Ref Range   WBC 18.4 (H) 4.0 - 10.5 K/uL   RBC 4.91 3.87 - 5.11 MIL/uL   Hemoglobin 14.4 12.0 - 15.0 g/dL   HCT 12.8 78.6 - 76.7 %   MCV 90.0 80.0 - 100.0 fL   MCH 29.3 26.0 - 34.0 pg   MCHC  32.6 30.0 - 36.0 g/dL   RDW 20.9 47.0 - 96.2 %   Platelets 329 150 - 400 K/uL   nRBC 0.0 0.0 - 0.2 %    Comment: Performed at [facility], [address] [place], [place] [address]  Urinalysis, Complete w Microscopic     Status: Abnormal   Collection Time: 12/21/19  5:31 AM  Result Value Ref Range   Color, Urine YELLOW (A) YELLOW   APPearance CLEAR (A) CLEAR   Specific Gravity, Urine 1.021 1.005 - 1.030   pH 5.0 5.0 - 8.0   Glucose, UA NEGATIVE NEGATIVE mg/dL   Hgb urine dipstick MODERATE (A) NEGATIVE   Bilirubin Urine NEGATIVE NEGATIVE   Ketones, ur NEGATIVE NEGATIVE mg/dL   Protein, ur NEGATIVE NEGATIVE mg/dL   Nitrite NEGATIVE NEGATIVE   Leukocytes,Ua NEGATIVE NEGATIVE   RBC / HPF 0-5 0 - 5 RBC/hpf   WBC, UA 0-5 0 - 5 WBC/hpf   Bacteria, UA RARE (A) NONE SEEN   Squamous Epithelial / LPF 0-5 0 - 5    Comment: Performed at [facility], [address] [place], [place] [address]  hCG, quantitative, pregnancy     Status: Abnormal   Collection Time: 12/21/19  5:31 AM  Result Value Ref Range   hCG, Beta Chain, Quant, S 4,956 (H) <5 mIU/mL    Comment:          GEST. AGE      CONC.  (mIU/mL)   <=1 WEEK        5 - 50     2 WEEKS       50 - 500     3 WEEKS       100 - 10,000     4 WEEKS     1,000 - 30,000     5 WEEKS     3,500 - 115,000   6-8 WEEKS     12,000 - 270,000    12 WEEKS     15,000 - 220,000        FEMALE AND NON-PREGNANT FEMALE:     LESS THAN 5 mIU/mL Performed at [facility], [address] [place], [place] [address]   Wet prep, genital     Status: Abnormal   Collection Time: 12/21/19 10:56 AM  Result Value Ref Range   Yeast Wet Prep HPF POC NONE SEEN NONE SEEN   Trich, Wet Prep NONE  SEEN NONE SEEN   Clue Cells Wet Prep HPF POC NONE SEEN NONE SEEN   WBC, Wet Prep HPF POC FEW (A) NONE SEEN   Sperm NONE SEEN     Comment: Performed at [facility], [address] [place], [place] [address]    US OB LESS THAN 14 WEEKS  WITH [place] TRANSVAGINAL  Result Date: 12/21/2019 CLINICAL DATA:  34 year old pregnant female presents with pelvic pain and cramping. Quantitative beta HCG 2,754 on 12/15/2019. EDC by LMP: 08/11/2020, projecting to an expected gestational age of [redacted] weeks 4 days. EXAM: OBSTETRIC <14 WK [place] AND TRANSVAGINAL OB US TECHNIQUE: Both transabdominal and transvaginal ultrasound examinations were performed for complete evaluation of the gestation as well as the maternal uterus, adnexal regions, and pelvic cul-de-sac. Transvaginal technique was performed to assess early pregnancy. COMPARISON:  12/15/2019 and 12/12/2019 obstetric scans. FINDINGS: Intrauterine gestational sac: Single Yolk sac:  Visualized. Embryo:  Not Visualized. Embryonic Cardiac Activity: Not Visualized. MSD: 12.2 mm 5 w 5 d, compared to 4.8 mm on 12/12/2019 and 6.8 mm on 12/15/2019 scans Subchorionic hemorrhage:  None visualized. Maternal uterus/adnexae: Anteverted uterus. No uterine fibroids demonstrated. Right ovary measures 3.2 x 2.5 x 2.6 cm and contains a corpus luteum. Left ovary measures 2.5 x 1.6 x 2.0 cm. No abnormal ovarian or adnexal masses. No abnormal free fluid the pelvis. IMPRESSION: 1. Single intrauterine gestational sac with yolk sac at 5 weeks 5 days by mean sac diameter. No embryo visualized at this time. The gestational sac continues to enlarge compared to the 12/12/2019 and 12/15/2019 scans. Gestational viability remains indeterminate. Follow-up obstetric scan recommended in 11-14 days, or earlier as clinically warranted. 2. No ovarian or adnexal abnormality. Electronically Signed   By: Delbert Phenix M.D.   On: 12/21/2019 12:09    Review of Systems Blood pressure 122/76, pulse 79, temperature 98.4 F (36.9 C), temperature source Oral, resp. rate 16, height 5\' 1"  (1.549 m), weight 127.5 kg, last menstrual period 11/05/2019, SpO2 100 %, unknown if currently breastfeeding. Physical Exam  Nursing note and vitals reviewed. Constitutional:  She is oriented to person, place, and time. She appears well-developed and well-nourished.  HENT:  Head: Normocephalic and atraumatic.  Cardiovascular: Normal rate and regular rhythm.  Respiratory: Effort normal and breath sounds normal.  GI: Soft. Bowel sounds are normal.  Genitourinary:    Genitourinary Comments: External: Vulva normal. No lesions noted.  Speculum examination:  Cervix normal . No blood in the vaginal vault. White vaginal discharge.   Bimanual examination: Uterus tender, midline, normal in size, shape and contour.  + CMT. Adnexa tenderness. Pelvis is not fixed.    Musculoskeletal:        General: Normal range of motion.  Neurological: She is alert and oriented to person, place, and time.  Skin: Skin is warm and dry.  Psychiatric: She has a normal mood and affect. Her behavior is normal. Judgment and thought content normal.    Assessment/Plan: 34 yo [id number] [redacted]w[redacted]d 1. PID  with failed outpatient treatment and elevated WBC count. Will admit for IV antibiotics. Urine culture, blood culture sent. STD testing.  2. Pain control with PO and IV medications 3. Regular diet  Yolette Hastings R Blaire Hodsdon 12/21/2019, 4:52 PM

## 2019-12-22 LAB — CBC
HCT: 38.8 % (ref 36.0–46.0)
Hemoglobin: 12.7 g/dL (ref 12.0–15.0)
MCH: 29.5 pg (ref 26.0–34.0)
MCHC: 32.7 g/dL (ref 30.0–36.0)
MCV: 90.2 fL (ref 80.0–100.0)
Platelets: 239 10*3/uL (ref 150–400)
RBC: 4.3 MIL/uL (ref 3.87–5.11)
RDW: 13.3 % (ref 11.5–15.5)
WBC: 11.1 10*3/uL — ABNORMAL HIGH (ref 4.0–10.5)
nRBC: 0 % (ref 0.0–0.2)

## 2019-12-22 LAB — URINE CULTURE
Culture: NO GROWTH
Special Requests: NORMAL

## 2019-12-22 LAB — RPR: RPR Ser Ql: NONREACTIVE

## 2019-12-22 MED ORDER — METRONIDAZOLE 500 MG PO TABS: 500.0000 mg | ORAL_TABLET | Freq: Two times a day (BID) | ORAL | 0 refills | Status: DC

## 2019-12-22 MED ORDER — AZITHROMYCIN 250 MG PO TABS: 250.0000 mg | ORAL_TABLET | Freq: Every day | ORAL | 1 refills | Status: DC

## 2019-12-22 NOTE — Plan of Care (Signed)
Gina Farmer was seen and treated at [facility] on December 21, 2019, hospitalized, and discharged on 12/23/19.  Please excuse her from work during this time frame.  Thank you,  [facility]

## 2019-12-22 NOTE — Plan of Care (Signed)
Vs stable; having lower abdominal pain; taking norco for pain and occasional morphine; tolerating regular diet;up ad lib; has 2 iv antibiotics; takes hydroxyzine for anxiety

## 2019-12-22 NOTE — Discharge Summary (Signed)
Physician Discharge Summary  Patient ID: Gina Farmer MRN: [id number] DOB/AGE: May 28, 1986 34 y.o.  Admit date: 12/21/2019 Discharge date: 12/22/2019  Admission Diagnoses: PID  Discharge Diagnoses: PID   Discharged Condition: good  Hospital Course: 34 year old at [redacted]w[redacted]d admitted for mildly elevated WBC and abdominal pain.  Patient also reported nausea and emesis.  It is unclear if her leukocytosis was pregnancy related as well as her nausea but given no other etiology for her abdominal pain and recent trichomonas was started on treatment for PID.  She completed 24-hrs of cefotetan and flagyl.  She was transitioned to po azithromycin and flagyl.  This is an undesired pregnancy but until termination will treat as if patient is planning to continue which si why azithromycin was chosen as opposed to doxycyline.    Consults: None  Significant Diagnostic Studies:  Results for orders placed or performed during the hospital encounter of 12/21/19 (from the past 24 hour(s))  HIV Antibody (routine testing w rflx)     Status: None   Collection Time: 12/21/19  3:51 PM  Result Value Ref Range   HIV Screen 4th Generation wRfx NON REACTIVE NON REACTIVE  Hepatitis panel, acute     Status: None   Collection Time: 12/21/19  3:51 PM  Result Value Ref Range   Hepatitis B Surface Ag NON REACTIVE NON REACTIVE   HCV Ab NON REACTIVE NON REACTIVE   Hep A IgM NON REACTIVE NON REACTIVE   Hep B C IgM NON REACTIVE NON REACTIVE  Blood culture (routine x 2)     Status: None (Preliminary result)   Collection Time: 12/21/19  3:51 PM   Specimen: BLOOD  Result Value Ref Range   Specimen Description BLOOD RIGHT ANTECUBITAL    Special Requests      BOTTLES DRAWN AEROBIC AND ANAEROBIC Blood Culture adequate volume   Culture      NO GROWTH < 24 HOURS Performed at [facility], [address] [place], [place] [address]    Report Status PENDING   Blood culture (routine x 2)     Status: None (Preliminary  result)   Collection Time: 12/21/19  4:05 PM   Specimen: BLOOD  Result Value Ref Range   Specimen Description BLOOD RIGHT ANTECUBITAL    Special Requests      BOTTLES DRAWN AEROBIC AND ANAEROBIC Blood Culture adequate volume   Culture      NO GROWTH < 24 HOURS Performed at [facility], [address] [place], [place] [address]    Report Status PENDING   Respiratory Panel by RT PCR (Flu A&B, Covid) - Nasopharyngeal Swab     Status: None   Collection Time: 12/21/19  4:05 PM   Specimen: Nasopharyngeal Swab  Result Value Ref Range   SARS Coronavirus 2 by RT PCR NEGATIVE NEGATIVE   Influenza A by PCR NEGATIVE NEGATIVE   Influenza B by PCR NEGATIVE NEGATIVE  CBC     Status: Abnormal   Collection Time: 12/22/19  6:25 AM  Result Value Ref Range   WBC 11.1 (H) 4.0 - 10.5 K/uL   RBC 4.30 3.87 - 5.11 MIL/uL   Hemoglobin 12.7 12.0 - 15.0 g/dL   HCT 57.3 22.0 - 25.4 %   MCV 90.2 80.0 - 100.0 fL   MCH 29.5 26.0 - 34.0 pg   MCHC 32.7 30.0 - 36.0 g/dL   RDW 27.0 62.3 - 76.2 %   Platelets 239 150 - 400 K/uL   nRBC 0.0 0.0 -  0.2 %   US OB Transvaginal  Result Date: 12/15/2019 CLINICAL DATA:  Right-sided pelvic pain EXAM: TRANSVAGINAL OB ULTRASOUND TECHNIQUE: Transvaginal ultrasound was performed for complete evaluation of the gestation as well as the maternal uterus, adnexal regions, and pelvic cul-de-sac. COMPARISON:  12/12/2019 FINDINGS: Intrauterine gestational sac: Present Yolk sac:  Present Embryo:  Absent MSD: 6.8 mm mm   5 w   2 d Subchorionic hemorrhage:  None visualized. Maternal uterus/adnexae: Right ovary is within normal limits. The left ovary is not well visualized. IMPRESSION: Single intrauterine gestational sac as described. The fetal pole is not visualized at this time. Follow-up examination can be performed as clinically indicated. Electronically Signed   By: Inez Catalina M.D.   On: 12/15/2019 18:00   US OB Transvaginal  Result Date: 12/12/2019 CLINICAL DATA:   Abdominal cramping affecting pregnancy. Vaginal bleeding in 1st trimester pregnancy. 1st trimester pregnancy of unknown anatomic location. EXAM: TRANSVAGINAL OB ULTRASOUND TECHNIQUE: Transvaginal ultrasound was performed for complete evaluation of the gestation as well as the maternal uterus, adnexal regions, and pelvic cul-de-sac. COMPARISON:  12/08/2019 FINDINGS: Intrauterine gestational sac: Single Yolk sac:  Not Visualized. Embryo:  Not Visualized. MSD: 5 mm   5 w   1 d Subchorionic hemorrhage:  None visualized. Maternal uterus/adnexae: Both ovaries are normal in appearance. No mass or abnormal free fluid identified. IMPRESSION: Single early approximately 5 week intrauterine gestational sac. Suggest correlation with serial b-hCG levels, and consider followup ultrasound to assess viability in 14 days. No mass or free fluid identified. Electronically Signed   By: Marlaine Hind M.D.   On: 12/12/2019 18:27   US OB LESS THAN 14 WEEKS WITH OB TRANSVAGINAL  Result Date: 12/21/2019 CLINICAL DATA:  34 year old pregnant female presents with pelvic pain and cramping. Quantitative beta HCG 2,754 on 12/15/2019. EDC by LMP: 08/11/2020, projecting to an expected gestational age of [redacted] weeks 4 days. EXAM: OBSTETRIC <14 WK [place] AND TRANSVAGINAL OB US TECHNIQUE: Both transabdominal and transvaginal ultrasound examinations were performed for complete evaluation of the gestation as well as the maternal uterus, adnexal regions, and pelvic cul-de-sac. Transvaginal technique was performed to assess early pregnancy. COMPARISON:  12/15/2019 and 12/12/2019 obstetric scans. FINDINGS: Intrauterine gestational sac: Single Yolk sac:  Visualized. Embryo:  Not Visualized. Embryonic Cardiac Activity: Not Visualized. MSD: 12.2 mm 5 w 5 d, compared to 4.8 mm on 12/12/2019 and 6.8 mm on 12/15/2019 scans Subchorionic hemorrhage:  None visualized. Maternal uterus/adnexae: Anteverted uterus. No uterine fibroids demonstrated. Right ovary measures 3.2 x  2.5 x 2.6 cm and contains a corpus luteum. Left ovary measures 2.5 x 1.6 x 2.0 cm. No abnormal ovarian or adnexal masses. No abnormal free fluid the pelvis. IMPRESSION: 1. Single intrauterine gestational sac with yolk sac at 5 weeks 5 days by mean sac diameter. No embryo visualized at this time. The gestational sac continues to enlarge compared to the 12/12/2019 and 12/15/2019 scans. Gestational viability remains indeterminate. Follow-up obstetric scan recommended in 11-14 days, or earlier as clinically warranted. 2. No ovarian or adnexal abnormality. Electronically Signed   By: Ilona Sorrel M.D.   On: 12/21/2019 12:09   US OB LESS THAN 14 WEEKS WITH OB TRANSVAGINAL  Result Date: 12/08/2019 CLINICAL DATA:  34 year old female with positive HCG level and pelvic pain. LMP: 11/05/2019 corresponding to an estimated gestational age of [redacted] weeks, 5 days. EXAM: OBSTETRIC <14 WK [place] AND TRANSVAGINAL OB US TECHNIQUE: Both transabdominal and transvaginal ultrasound examinations were performed for complete evaluation of the gestation as well  as the maternal uterus, adnexal regions, and pelvic cul-de-sac. Transvaginal technique was performed to assess early pregnancy. COMPARISON:  None. FINDINGS: The uterus is anteverted and appears unremarkable. The endometrium measures approximately 8 mm. Faint apparent hypoechoic foci in the upper endometrium measuring 3 mm may be artifactual or represent tiny endometrial cysts. No definite gestational sac identified. The maternal ovaries are unremarkable. The right ovary measures 3.4 x 2.3 x 2.5 cm and the left ovary measures 2.9 x 1.6 x 2.0 cm. No free fluid within the pelvis. IMPRESSION: No definite intrauterine pregnancy identified and no adnexal masses noted. Findings consistent with pregnancy of unknown location and differential diagnosis includes an early IUP, recent spontaneous abortion, or an occult ectopic pregnancy. Clinical correlation and follow-up with serial HCG levels and  repeat ultrasound in 7-11 days, or earlier if clinically indicated, recommended. Electronically Signed   By: Elgie Collard M.D.   On: 12/08/2019 23:23     Treatments: antibiotics.  Discharge Exam: Blood pressure 116/68, pulse 67, temperature 97.9 F (36.6 C), temperature source Axillary, resp. rate 20, height 5\' 1"  (1.549 m), weight 127.5 kg, last menstrual period 11/05/2019, SpO2 100 %, unknown if currently breastfeeding. General appearance: alert, appears stated age and no distress Resp: clear to auscultation bilaterally GI: soft, non-tender; bowel sounds normal; no masses,  no organomegaly Extremities: extremities normal, atraumatic, no cyanosis or edema  Disposition: Discharge disposition: 01-Home or Self Care       Discharge Instructions    Discharge activity:  No Restrictions   Complete by: As directed    Discharge diet:  No restrictions   Complete by: As directed    No sexual activity restrictions   Complete by: As directed    Notify physician for a general feeling that "something is not right"   Complete by: As directed    Notify physician for increase or change in vaginal discharge   Complete by: As directed    Notify physician for intestinal cramps, with or without diarrhea, sometimes described as "gas pain"   Complete by: As directed    Notify physician for leaking of fluid   Complete by: As directed    Notify physician for low, dull backache, unrelieved by heat or Tylenol   Complete by: As directed    Notify physician for menstrual like cramps   Complete by: As directed    Notify physician for pelvic pressure   Complete by: As directed    Notify physician for uterine contractions.  These may be painless and feel like the uterus is tightening or the baby is  "balling up"   Complete by: As directed    Notify physician for vaginal bleeding   Complete by: As directed    PRETERM LABOR:  Includes any of the follwing symptoms that occur between 20 - [redacted] weeks  gestation.  If these symptoms are not stopped, preterm labor can result in preterm delivery, placing your baby at risk   Complete by: As directed      Allergies as of 12/22/2019      Reactions   Trileptal [oxcarbazepine] Swelling   Per pt, "leg swelling"   Viibryd [vilazodone Hcl] Swelling   Per pt "leg swelling"      Medication List    STOP taking these medications   benzonatate 100 MG capsule Commonly known as: TESSALON   cephALEXin 500 MG capsule Commonly known as: KEFLEX   tiZANidine 4 MG tablet Commonly known as: Zanaflex     TAKE these medications  azithromycin 250 MG tablet Commonly known as: ZITHROMAX Take 1 tablet (250 mg total) by mouth daily. Take as directed: Two pills by mouth the first day, then one pill every day until completed   fluticasone 50 MCG/ACT nasal spray Commonly known as: FLONASE Place 2 sprays into both nostrils daily.   hydrOXYzine 10 MG tablet Commonly known as: ATARAX/VISTARIL Take 1 tablet (10 mg total) by mouth every 6 (six) hours as needed for anxiety.   metroNIDAZOLE 500 MG tablet Commonly known as: Flagyl Take 1 tablet (500 mg total) by mouth 2 (two) times daily for 14 days.   omeprazole 20 MG tablet Commonly known as: PriLOSEC OTC Take 1 tablet (20 mg total) by mouth daily.      Follow-up Information    Schuman, Jaquelyn Bitter, MD Follow up.   Specialty: Obstetrics and Gynecology Why: Follow up hospital and follow up TVUS ultrasound Contact information: [address] [place] [address] [phone]           Signed: Vena Austria 12/22/2019, 12:05 PM

## 2019-12-22 NOTE — Discharge Instructions (Signed)
Call for increased pain, vaginal bleeding, or fever  Call [facility] for f/u visit and TV[facility] in next wk

## 2019-12-22 NOTE — Progress Notes (Signed)
DC instr reviewed with pt.  Reviewed medication schedule and instr to finish all antibiotics.  Verb u/o of f/u appt and care.  DC to home to car via wc accompanied by nurse.

## 2019-12-23 LAB — GC/CHLAMYDIA PROBE AMP
Chlamydia trachomatis, NAA: NEGATIVE
Neisseria Gonorrhoeae by PCR: NEGATIVE

## 2019-12-26 ENCOUNTER — Other Ambulatory Visit: Payer: Self-pay | Admitting: Obstetrics and Gynecology

## 2019-12-26 DIAGNOSIS — O3680X0 Pregnancy with inconclusive fetal viability, not applicable or unspecified: Secondary | ICD-10-CM

## 2019-12-26 LAB — CULTURE, BLOOD (ROUTINE X 2)
Culture: NO GROWTH
Culture: NO GROWTH
Special Requests: ADEQUATE
Special Requests: ADEQUATE

## 2019-12-26 NOTE — Telephone Encounter (Signed)
Follow up needs to be with ultrasound as well I placed an order for the ultrasound

## 2019-12-27 ENCOUNTER — Encounter (HOSPITAL_COMMUNITY): Payer: Self-pay | Admitting: Family Medicine

## 2019-12-27 ENCOUNTER — Inpatient Hospital Stay (HOSPITAL_COMMUNITY)
Admission: AD | Admit: 2019-12-27 | Discharge: 2019-12-27 | Disposition: A | Discharge status: Home / Self Care | Payer: 59 | Attending: Family Medicine | Admitting: Family Medicine

## 2019-12-27 ENCOUNTER — Other Ambulatory Visit: Payer: Self-pay

## 2019-12-27 DIAGNOSIS — O99331 Smoking (tobacco) complicating pregnancy, first trimester: Secondary | ICD-10-CM | POA: Diagnosis not present

## 2019-12-27 DIAGNOSIS — Z3A01 Less than 8 weeks gestation of pregnancy: Secondary | ICD-10-CM | POA: Insufficient documentation

## 2019-12-27 DIAGNOSIS — O219 Vomiting of pregnancy, unspecified: Secondary | ICD-10-CM | POA: Diagnosis not present

## 2019-12-27 DIAGNOSIS — O210 Mild hyperemesis gravidarum: Secondary | ICD-10-CM | POA: Diagnosis present

## 2019-12-27 DIAGNOSIS — O99211 Obesity complicating pregnancy, first trimester: Secondary | ICD-10-CM | POA: Insufficient documentation

## 2019-12-27 DIAGNOSIS — F17210 Nicotine dependence, cigarettes, uncomplicated: Secondary | ICD-10-CM | POA: Insufficient documentation

## 2019-12-27 DIAGNOSIS — O211 Hyperemesis gravidarum with metabolic disturbance: Secondary | ICD-10-CM | POA: Diagnosis not present

## 2019-12-27 LAB — URINALYSIS, ROUTINE W REFLEX MICROSCOPIC
Bilirubin Urine: NEGATIVE
Glucose, UA: NEGATIVE mg/dL
Ketones, ur: NEGATIVE mg/dL
Leukocytes,Ua: NEGATIVE
Nitrite: NEGATIVE
Protein, ur: NEGATIVE mg/dL
Specific Gravity, Urine: 1.027 (ref 1.005–1.030)
pH: 6 (ref 5.0–8.0)

## 2019-12-27 MED ORDER — LACTATED RINGERS IV SOLN: Freq: Once | INTRAVENOUS | Status: AC

## 2019-12-27 MED ORDER — PROMETHAZINE HCL 25 MG/ML IJ SOLN
25.0000 mg | Freq: Once | INTRAMUSCULAR | Status: AC
  Administered 2019-12-27: 25 mg via INTRAVENOUS
  Filled 2019-12-27: qty 1

## 2019-12-27 MED ORDER — PROMETHAZINE HCL 25 MG PO TABS: 25.0000 mg | ORAL_TABLET | Freq: Four times a day (QID) | ORAL | 0 refills | Status: DC | PRN

## 2019-12-27 MED ORDER — ACETAMINOPHEN 500 MG PO TABS
1000.0000 mg | ORAL_TABLET | Freq: Once | ORAL | Status: AC
  Administered 2019-12-27: 1000 mg via ORAL
  Filled 2019-12-27: qty 2

## 2019-12-27 NOTE — MAU Provider Note (Addendum)
Chief Complaint: Emesis   First Provider Initiated Contact with Patient 12/27/19 [phone]        SUBJECTIVE HPI: Gina Farmer is a 34 y.o. G2P0010 at [redacted]w[redacted]d by LMP who presents to maternity admissions reporting multiple complaints. Signed in with "high blood pressure" but reports episodes this past week of feeling week and dizzy, almost fainting.  Also has nausea and vomiting 2-3 times per day. . Was hospitalized this week for this and presumed PID (tx with antibiotics).  Has Diclegis and Zofran, but out of Zofran.  Plans termination "if the baby is viable".   She denies vaginal bleeding, vaginal itching/burning, urinary symptoms, h/a, dizziness, or fever/chills.     Emesis  This is a new problem. The current episode started in the past 7 days. The problem occurs 2 to 4 times per day. The problem has been unchanged. There has been no fever. Associated symptoms include abdominal pain (pelvic cramping) and dizziness. Pertinent negatives include no chills, diarrhea, fever or myalgias. She has tried increased fluids for the symptoms. The treatment provided no relief.   RN note: PT SAYS SHE STARTED VOMITING YESTERDAY.   ON 7-8TH- WAS HOSPITALIZED FOR PID AND UTI .  WAS HERE 12-15-19- GAVE B6 - HELPED SOME , FINISHED ZOFRAN . PLANNING TO TERMINATE.     U/S AT [facility] - NO FETAL POLE. Marland Kitchen  HAS H/A NOW . VOMITED CHICKEN AND  SPAGETTI. Marland Kitchen   FEELS HOT WHEN NAUSEA.  Past Medical History:  Diagnosis Date  . Anxiety   . Back injury   . Bipolar 1 disorder (HCC)   . Depression   . Morbid obesity (HCC)   . PTSD (post-traumatic stress disorder)    Past Surgical History:  Procedure Laterality Date  . APPENDECTOMY  2009  . INTRAOCULAR LENS IMPLANT, SECONDARY    . LAPAROSCOPY  2003   Social History   Socioeconomic History  . Marital status: Single    Spouse name: Not on file  . Number of children: Not on file  . Years of education: Not on file  . Highest education level: Not on file  Occupational History   . Not on file  Tobacco Use  . Smoking status: Current Every Day Smoker    Packs/day: 0.50    Types: Cigarettes  . Smokeless tobacco: Never Used  Substance and Sexual Activity  . Alcohol use: Not Currently    Comment: social  . Drug use: Yes    Types: Marijuana    Comment: LAST SMOKED ON 12-11-19  . Sexual activity: Yes    Birth control/protection: Condom, Pill  Other Topics Concern  . Not on file  Social History Narrative  . Not on file   Social Determinants of Health   Financial Resource Strain:   . Difficulty of Paying Living Expenses: Not on file  Food Insecurity:   . Worried About Programme researcher, broadcasting/film/video in the Last Year: Not on file  . Ran Out of Food in the Last Year: Not on file  Transportation Needs:   . Lack of Transportation (Medical): Not on file  . Lack of Transportation (Non-Medical): Not on file  Physical Activity:   . Days of Exercise per Week: Not on file  . Minutes of Exercise per Session: Not on file  Stress:   . Feeling of Stress : Not on file  Social Connections:   . Frequency of Communication with Friends and Family: Not on file  . Frequency of Social Gatherings with Friends and  Family: Not on file  . Attends Religious Services: Not on file  . Active Member of Clubs or Organizations: Not on file  . Attends Banker Meetings: Not on file  . Marital Status: Not on file  Intimate Partner Violence:   . Fear of Current or Ex-Partner: Not on file  . Emotionally Abused: Not on file  . Physically Abused: Not on file  . Sexually Abused: Not on file   No current facility-administered medications on file prior to encounter.   Current Outpatient Medications on File Prior to Encounter  Medication Sig Dispense Refill  . azithromycin (ZITHROMAX) 250 MG tablet Take 1 tablet (250 mg total) by mouth daily. Take as directed: Two pills by mouth the first day, then one pill every day until completed 14 tablet 1  . metroNIDAZOLE (FLAGYL) 500 MG tablet Take  1 tablet (500 mg total) by mouth 2 (two) times daily for 14 days. 28 tablet 0  . fluticasone (FLONASE) 50 MCG/ACT nasal spray Place 2 sprays into both nostrils daily. 16 g 6  . hydrOXYzine (ATARAX/VISTARIL) 10 MG tablet Take 1 tablet (10 mg total) by mouth every 6 (six) hours as needed for anxiety. (Patient not taking: Reported on 12/21/2019) 30 tablet 1  . omeprazole (PRILOSEC OTC) 20 MG tablet Take 1 tablet (20 mg total) by mouth daily. 28 tablet 1   Allergies  Allergen Reactions  . Trileptal [Oxcarbazepine] Swelling    Per pt, "leg swelling"  . Viibryd [Vilazodone Hcl] Swelling    Per pt "leg swelling"    I have reviewed patient's Past Medical Hx, Surgical Hx, Family Hx, Social Hx, medications and allergies.   ROS:  Review of Systems  Constitutional: Negative for chills and fever.  Gastrointestinal: Positive for abdominal pain (pelvic cramping) and vomiting. Negative for diarrhea.  Musculoskeletal: Negative for myalgias.  Neurological: Positive for dizziness.   Review of Systems  Other systems negative   Physical Exam  Physical Exam Patient Vitals for the past 24 hrs:  BP Temp Temp src Pulse Resp Height Weight  12/27/19 0616 114/60 98.3 F (36.8 C) Oral 78 20 5' 1.5" (1.562 m) 127.1 kg   Constitutional: Well-developed, well-nourished female in no acute distress.  Cardiovascular: normal rate Respiratory: normal effort GI: Abd soft, non-tender. Pos BS x 4 MS: Extremities nontender, no edema, normal ROM Neurologic: Alert and oriented x 4.  GU: Neg CVAT.  PELVIC EXAM: deferred  LAB RESULTS Results for orders placed or performed during the hospital encounter of 12/27/19 (from the past 24 hour(s))  Urinalysis, Routine w reflex microscopic     Status: Abnormal   Collection Time: 12/27/19  6:29 AM  Result Value Ref Range   Color, Urine YELLOW YELLOW   APPearance CLEAR CLEAR   Specific Gravity, Urine 1.027 1.005 - 1.030   pH 6.0 5.0 - 8.0   Glucose, UA NEGATIVE NEGATIVE  mg/dL   Hgb urine dipstick LARGE (A) NEGATIVE   Bilirubin Urine NEGATIVE NEGATIVE   Ketones, ur NEGATIVE NEGATIVE mg/dL   Protein, ur NEGATIVE NEGATIVE mg/dL   Nitrite NEGATIVE NEGATIVE   Leukocytes,Ua NEGATIVE NEGATIVE   RBC / HPF 21-50 0 - 5 RBC/hpf   WBC, UA 0-5 0 - 5 WBC/hpf   Bacteria, UA RARE (A) NONE SEEN   Squamous Epithelial / LPF 6-10 0 - 5   Mucus PRESENT      IMAGING US done this past week showed Single GS with Yolk Sac, effectively rules out ectopic.  No fetal pole  yet.   MAU Management/MDM: Ordered IV hydration x 1 liter with Phenergan for nausea No ketones but urine is concentrated DIscussed there is not much we can do about pelvic pressure and cramping.   ASSESSMENT Pregnancy at [redacted]w[redacted]d Nausea and vomiting Mild dehydration Dizziness  Pelvic pressure/cramping  PLAN Report to oncoming provider  Hansel Feinstein CNM, MSN Certified Nurse-Midwife 12/27/2019  0800   Care assumed from [place] at 0800  Nausea improved with IV fluids and phenergan. Pt requesting prescription for phenergan to take at home.  Now complaining of a headache that she rates 5/10. Given tylenol 1gm PO & reports improvement in symptoms.    A:  1. Nausea and vomiting during pregnancy prior to [redacted] weeks gestation    P: Discharge home F/u with ob/gyn as scheduled Rx phenergan Discussed reasons to return to MAU  Jorje Guild, NP

## 2019-12-27 NOTE — MAU Note (Signed)
PT SAYS SHE STARTED VOMITING YESTERDAY.   ON 7-8TH- WAS HOSPITALIZED FOR PID AND UTI .  WAS HERE 12-15-19- GAVE B6 - HELPED SOME , FINISHED ZOFRAN . PLANNING TO TERMINATE.     U/S AT [facility] - NO FETAL POLE. Marland Kitchen  HAS H/A NOW . VOMITED CHICKEN AND  SPAGETTI. Marland Kitchen   FEELS HOT WHEN NAUSEA.

## 2019-12-27 NOTE — Telephone Encounter (Signed)
Patient is schedule for [facility] on 01/02/20

## 2019-12-27 NOTE — Discharge Instructions (Signed)

## 2019-12-29 LAB — CULTURE, OB URINE

## 2019-12-30 ENCOUNTER — Inpatient Hospital Stay (HOSPITAL_COMMUNITY)
Admission: AD | Admit: 2019-12-30 | Discharge: 2019-12-31 | Disposition: A | Discharge status: Home / Self Care | Payer: 59 | Attending: Obstetrics and Gynecology | Admitting: Obstetrics and Gynecology

## 2019-12-30 ENCOUNTER — Encounter (HOSPITAL_COMMUNITY): Payer: Self-pay | Admitting: Obstetrics and Gynecology

## 2019-12-30 ENCOUNTER — Inpatient Hospital Stay (HOSPITAL_COMMUNITY): Payer: 59

## 2019-12-30 ENCOUNTER — Other Ambulatory Visit: Payer: Self-pay

## 2019-12-30 DIAGNOSIS — O99341 Other mental disorders complicating pregnancy, first trimester: Secondary | ICD-10-CM | POA: Diagnosis not present

## 2019-12-30 DIAGNOSIS — F17210 Nicotine dependence, cigarettes, uncomplicated: Secondary | ICD-10-CM | POA: Diagnosis not present

## 2019-12-30 DIAGNOSIS — O208 Other hemorrhage in early pregnancy: Secondary | ICD-10-CM | POA: Diagnosis not present

## 2019-12-30 DIAGNOSIS — Z3A01 Less than 8 weeks gestation of pregnancy: Secondary | ICD-10-CM | POA: Diagnosis not present

## 2019-12-30 DIAGNOSIS — Z888 Allergy status to other drugs, medicaments and biological substances status: Secondary | ICD-10-CM | POA: Diagnosis not present

## 2019-12-30 DIAGNOSIS — Z8349 Family history of other endocrine, nutritional and metabolic diseases: Secondary | ICD-10-CM | POA: Diagnosis not present

## 2019-12-30 DIAGNOSIS — O99331 Smoking (tobacco) complicating pregnancy, first trimester: Secondary | ICD-10-CM | POA: Insufficient documentation

## 2019-12-30 DIAGNOSIS — Z809 Family history of malignant neoplasm, unspecified: Secondary | ICD-10-CM | POA: Diagnosis not present

## 2019-12-30 DIAGNOSIS — R109 Unspecified abdominal pain: Secondary | ICD-10-CM | POA: Diagnosis not present

## 2019-12-30 DIAGNOSIS — O99891 Other specified diseases and conditions complicating pregnancy: Secondary | ICD-10-CM | POA: Insufficient documentation

## 2019-12-30 DIAGNOSIS — O99212 Obesity complicating pregnancy, second trimester: Secondary | ICD-10-CM | POA: Insufficient documentation

## 2019-12-30 DIAGNOSIS — Z3491 Encounter for supervision of normal pregnancy, unspecified, first trimester: Secondary | ICD-10-CM

## 2019-12-30 DIAGNOSIS — O468X1 Other antepartum hemorrhage, first trimester: Secondary | ICD-10-CM

## 2019-12-30 DIAGNOSIS — F3181 Bipolar II disorder: Secondary | ICD-10-CM | POA: Diagnosis not present

## 2019-12-30 DIAGNOSIS — O418X10 Other specified disorders of amniotic fluid and membranes, first trimester, not applicable or unspecified: Secondary | ICD-10-CM

## 2019-12-30 HISTORY — DX: Post-traumatic stress disorder, unspecified: F43.10

## 2019-12-30 HISTORY — DX: Bipolar II disorder: F31.81

## 2019-12-30 LAB — URINALYSIS, ROUTINE W REFLEX MICROSCOPIC
Bilirubin Urine: NEGATIVE
Glucose, UA: NEGATIVE mg/dL
Ketones, ur: NEGATIVE mg/dL
Leukocytes,Ua: NEGATIVE
Nitrite: NEGATIVE
Protein, ur: 30 mg/dL — AB
RBC / HPF: >50 RBC/hpf — ABNORMAL HIGH (ref 0–5)
Specific Gravity, Urine: 1.027 (ref 1.005–1.030)
pH: 5 (ref 5.0–8.0)

## 2019-12-30 LAB — CBC
HCT: 41.2 % (ref 36.0–46.0)
Hemoglobin: 13.6 g/dL (ref 12.0–15.0)
MCH: 29.8 pg (ref 26.0–34.0)
MCHC: 33 g/dL (ref 30.0–36.0)
MCV: 90.4 fL (ref 80.0–100.0)
Platelets: 305 10*3/uL (ref 150–400)
RBC: 4.56 MIL/uL (ref 3.87–5.11)
RDW: 13.3 % (ref 11.5–15.5)
WBC: 11.6 10*3/uL — ABNORMAL HIGH (ref 4.0–10.5)
nRBC: 0 % (ref 0.0–0.2)

## 2019-12-30 LAB — WET PREP, GENITAL
Sperm: NONE SEEN
Trich, Wet Prep: NONE SEEN
Yeast Wet Prep HPF POC: NONE SEEN

## 2019-12-30 LAB — HCG, QUANTITATIVE, PREGNANCY: hCG, Beta Chain, Quant, S: 5976 m[IU]/mL — ABNORMAL HIGH (ref <5)

## 2019-12-30 MED ORDER — ONDANSETRON 4 MG PO TBDP: 4.0000 mg | ORAL_TABLET | Freq: Three times a day (TID) | ORAL | 0 refills | Status: DC | PRN

## 2019-12-30 NOTE — MAU Provider Note (Signed)
History     CSN: [id number]  Arrival date and time: 12/30/19 2051   First Provider Initiated Contact with Patient 12/30/19 2145      Chief Complaint  Patient presents with  . Vaginal Bleeding   Gina Farmer is a 34 y.o. G2P0 at [redacted]w[redacted]d by LMP who presents to MAU with complaints of vaginal bleeding and abdominal cramping. She reports vaginal bleeding started tonight while at work. Describes vaginal bleeding as dark red spotting when she wipes - denies having to wear a pad or panty liner at this time. She denies recent IC or vaginal discharge prior to bleeding. She reports abdominal pain is associated with vaginal bleeding. Describes as lower abdominal cramping that is intermittent - rates pain 6/10- has not taken any medication for abdominal pain. She believes she may be having a miscarriage - is unsure at this time of whether she is going to continue with pregnancy or not.    OB History    Gravida  2   Para      Term      Preterm      AB  1   Living        SAB      TAB  1   Ectopic      Multiple      Live Births              Past Medical History:  Diagnosis Date  . Anxiety   . Back injury   . Bipolar 1 disorder (HCC)   . Bipolar 2 disorder (HCC)   . Bipolar 2 disorder (HCC)   . Depression   . Morbid obesity (HCC)   . Post traumatic stress disorder (PTSD)   . PTSD (post-traumatic stress disorder)     Past Surgical History:  Procedure Laterality Date  . APPENDECTOMY  2009  . INTRAOCULAR LENS IMPLANT, SECONDARY    . LAPAROSCOPY  2003    Family History  Problem Relation Age of Onset  . Hypertension Father   . Heart disease Father   . Narcolepsy Father   . Obesity Father   . Hypertension Brother   . Cancer Maternal Aunt   . Obesity Maternal Aunt   . Obesity Paternal Aunt   . Heart disease Maternal Grandfather     Social History   Tobacco Use  . Smoking status: Current Every Day Smoker    Packs/day: 0.50    Types: Cigarettes  . Smokeless  tobacco: Never Used  Substance Use Topics  . Alcohol use: Not Currently    Comment: social  . Drug use: Yes    Types: Marijuana    Comment: LAST SMOKED ON 12-11-19    Allergies:  Allergies  Allergen Reactions  . Trileptal [Oxcarbazepine] Swelling    Per pt, "leg swelling"  . Viibryd [Vilazodone Hcl] Swelling    Per pt "leg swelling"    Medications Prior to Admission  Medication Sig Dispense Refill Last Dose  . azithromycin (ZITHROMAX) 250 MG tablet Take 1 tablet (250 mg total) by mouth daily. Take as directed: Two pills by mouth the first day, then one pill every day until completed 14 tablet 1 12/29/2019 at 0830pm  . hydrOXYzine (ATARAX) 10 MG/5ML syrup Take 20 mg by mouth every 6 (six) hours as needed.   Past Week at Unknown time  . metroNIDAZOLE (FLAGYL) 500 MG tablet Take 1 tablet (500 mg total) by mouth 2 (two) times daily for 14 days. 28 tablet 0 12/29/2019 at  0830pm  . promethazine (PHENERGAN) 25 MG tablet Take 1 tablet (25 mg total) by mouth every 6 (six) hours as needed for nausea or vomiting. 30 tablet 0 12/29/2019 at 0600pm  . fluticasone (FLONASE) 50 MCG/ACT nasal spray Place 2 sprays into both nostrils daily. 16 g 6     Review of Systems  Constitutional: Negative.   Respiratory: Negative.   Cardiovascular: Negative.   Gastrointestinal: Positive for abdominal pain. Negative for constipation, diarrhea, nausea and vomiting.  Genitourinary: Positive for vaginal bleeding. Negative for difficulty urinating, dysuria, frequency and urgency.  Musculoskeletal: Negative.   Neurological: Negative.    Physical Exam   Blood pressure 121/63, pulse 89, temperature 97.6 F (36.4 C), last menstrual period 11/05/2019, unknown if currently breastfeeding.  Physical Exam  Vitals reviewed. Constitutional: She is oriented to person, place, and time. She appears well-developed and well-nourished. No distress.  Cardiovascular: Normal rate and regular rhythm.  Respiratory: Effort normal  and breath sounds normal. No respiratory distress. She has no wheezes.  GI: Soft. There is no abdominal tenderness. There is no rebound and no guarding.  Genitourinary:    Vaginal bleeding present.  There is bleeding in the vagina.    Genitourinary Comments: Pelvic exam: Cervix pink, visually closed, without lesion, scant dark red vaginal bleeding without clots, vaginal walls and external genitalia normal Bimanual exam: Cervix 0/long/high, firm, anterior, neg CMT, uterus nontender, nonenlarged, adnexa without tenderness, enlargement, or mass   Musculoskeletal:        General: No edema. Normal range of motion.  Neurological: She is alert and oriented to person, place, and time.  Psychiatric: She has a normal mood and affect. Her behavior is normal. Thought content normal.    MAU Course  Procedures  MDM Orders Placed This Encounter  Procedures  . Wet prep, genital  . US OB Transvaginal  . CBC  . hCG, quantitative, pregnancy  . Urinalysis, Routine w reflex microscopic    Labs and [place] report reviewed:  Results for orders placed or performed during the hospital encounter of 12/30/19 (from the past 24 hour(s))  Urinalysis, Routine w reflex microscopic     Status: Abnormal   Collection Time: 12/30/19  9:42 PM  Result Value Ref Range   Color, Urine AMBER (A) YELLOW   APPearance HAZY (A) CLEAR   Specific Gravity, Urine 1.027 1.005 - 1.030   pH 5.0 5.0 - 8.0   Glucose, UA NEGATIVE NEGATIVE mg/dL   Hgb urine dipstick LARGE (A) NEGATIVE   Bilirubin Urine NEGATIVE NEGATIVE   Ketones, ur NEGATIVE NEGATIVE mg/dL   Protein, ur 30 (A) NEGATIVE mg/dL   Nitrite NEGATIVE NEGATIVE   Leukocytes,Ua NEGATIVE NEGATIVE   RBC / HPF >50 (H) 0 - 5 RBC/hpf   WBC, UA 0-5 0 - 5 WBC/hpf   Bacteria, UA RARE (A) NONE SEEN   Squamous Epithelial / LPF 11-20 0 - 5   Mucus PRESENT    Hyaline Casts, UA PRESENT   CBC     Status: Abnormal   Collection Time: 12/30/19  9:51 PM  Result Value Ref Range   WBC 11.6  (H) 4.0 - 10.5 K/uL   RBC 4.56 3.87 - 5.11 MIL/uL   Hemoglobin 13.6 12.0 - 15.0 g/dL   HCT 75.1 02.5 - 85.2 %   MCV 90.4 80.0 - 100.0 fL   MCH 29.8 26.0 - 34.0 pg   MCHC 33.0 30.0 - 36.0 g/dL   RDW 77.8 24.2 - 35.3 %   Platelets 305 150 -  400 K/uL   nRBC 0.0 0.0 - 0.2 %  hCG, quantitative, pregnancy     Status: Abnormal   Collection Time: 12/30/19  9:51 PM  Result Value Ref Range   hCG, Beta Chain, Quant, S 5,976 (H) <5 mIU/mL  Wet prep, genital     Status: Abnormal   Collection Time: 12/30/19 10:41 PM   Specimen: PATH Cytology Cervicovaginal Ancillary Only  Result Value Ref Range   Yeast Wet Prep HPF POC NONE SEEN NONE SEEN   Trich, Wet Prep NONE SEEN NONE SEEN   Clue Cells Wet Prep HPF POC PRESENT (A) NONE SEEN   WBC, Wet Prep HPF POC FEW (A) NONE SEEN   Sperm NONE SEEN    US OB Transvaginal  Result Date: 12/30/2019 CLINICAL DATA:  Vaginal bleeding EXAM: OBSTETRIC <14 WK [place] AND TRANSVAGINAL OB US TECHNIQUE: Both transabdominal and transvaginal ultrasound examinations were performed for complete evaluation of the gestation as well as the maternal uterus, adnexal regions, and pelvic cul-de-sac. Transvaginal technique was performed to assess early pregnancy. COMPARISON:  December 21, 2019 FINDINGS: Intrauterine gestational sac: Single Yolk sac:  Visualized. Embryo:  Visualized. Cardiac Activity: Visualized. Heart Rate: 115 bpm CRL:  2.7 mm   5 w   5 d                  [place] EDC: 08/26/2020 Subchorionic hemorrhage: There is a small volume of subchorionic hemorrhage. Maternal uterus/adnexae: The ovaries are unremarkable. There is no significant free fluid. IMPRESSION: Single live IUP at 5 weeks and 5 days. Cardiac activity was visualized on this study. There is a small volume of subchorionic hemorrhage. Electronically Signed   By: Constance Holster M.D.   On: 12/30/2019 22:57   Discussed results of [place] and labs with patient. Discussed with patient that this is not a miscarriage at this time and  dating has changed based on [place] today. [place] notes GA of [redacted]w[redacted]d with EDC of 08/26/20  Patient request for picture as she is unsure of whether she is going to terminate or continue with pregnancy. Encouraged patient to process and discuss with FOB to make decision. If she is planning on continuing with pregnancy - encouraged to make appointment to initiate prenatal care.   Educated and discussed [facility] and what to expect with bleeding. Discussed with patient to abstain from IC and heavy lifting - work note given to patient.   Discussed reasons to return to MAU. Return to MAU as needed. Pt stable at time of discharge. Patient request zofran refill as phenergan is not work - discussed risk of zofran in early pregnancy and patient aware.   Assessment and Plan   1. Normal IUP (intrauterine pregnancy) on prenatal ultrasound, first trimester   2. [redacted] weeks gestation of pregnancy   3. Subchorionic hematoma in first trimester, single or unspecified fetus    Discharge home Pelvic rest  Return to MAU as needed for reasons discussed and/or emergencies  Rx for Zofran   Follow-up Information    [facility] Follow up.   Specialty: Obstetrics and Gynecology Why: Return to MAU as needed for emergencies  Contact information: [address] [id number] [place] [phone]         Allergies as of 12/31/2019      Reactions   Trileptal [oxcarbazepine] Swelling   Per pt, "leg swelling"   Viibryd [vilazodone Hcl] Swelling   Per pt "leg swelling"      Medication List  STOP taking these medications   azithromycin 250 MG tablet Commonly known as: ZITHROMAX   metroNIDAZOLE 500 MG tablet Commonly known as: Flagyl     TAKE these medications   fluticasone 50 MCG/ACT nasal spray Commonly known as: FLONASE Place 2 sprays into both nostrils daily.   hydrOXYzine 10 MG/5ML syrup Commonly known as: ATARAX Take 20 mg by mouth every 6 (six) hours as  needed.   ondansetron 4 MG disintegrating tablet Commonly known as: Zofran ODT Take 1 tablet (4 mg total) by mouth every 8 (eight) hours as needed for nausea or vomiting.   promethazine 25 MG tablet Commonly known as: PHENERGAN Take 1 tablet (25 mg total) by mouth every 6 (six) hours as needed for nausea or vomiting.       Sharyon Cable CNM 12/30/2019, 11:29 PM

## 2019-12-30 NOTE — MAU Note (Signed)
Pt reports to MAU for VB and cramping, pt states she has had light bleeding.

## 2019-12-31 ENCOUNTER — Other Ambulatory Visit: Payer: Self-pay

## 2019-12-31 DIAGNOSIS — O039 Complete or unspecified spontaneous abortion without complication: Secondary | ICD-10-CM | POA: Diagnosis not present

## 2019-12-31 DIAGNOSIS — Z3A01 Less than 8 weeks gestation of pregnancy: Secondary | ICD-10-CM | POA: Diagnosis not present

## 2019-12-31 DIAGNOSIS — F17210 Nicotine dependence, cigarettes, uncomplicated: Secondary | ICD-10-CM | POA: Diagnosis not present

## 2019-12-31 DIAGNOSIS — F1210 Cannabis abuse, uncomplicated: Secondary | ICD-10-CM | POA: Diagnosis not present

## 2019-12-31 DIAGNOSIS — O200 Threatened abortion: Secondary | ICD-10-CM | POA: Diagnosis present

## 2019-12-31 NOTE — ED Triage Notes (Signed)
Patient presents to ED for vaginal bleeding. Seen yesterday for same. Was told if she started to pass clots to return to ER. States today she is passing large clots. Has lower abdominal cramping that has progressively gotten worse.

## 2020-01-01 ENCOUNTER — Emergency Department: Payer: 59

## 2020-01-01 ENCOUNTER — Emergency Department
Admission: EM | Admit: 2020-01-01 | Discharge: 2020-01-01 | Disposition: A | Discharge status: Home / Self Care | Payer: 59 | Attending: Emergency Medicine | Admitting: Emergency Medicine

## 2020-01-01 DIAGNOSIS — O039 Complete or unspecified spontaneous abortion without complication: Secondary | ICD-10-CM

## 2020-01-01 DIAGNOSIS — O209 Hemorrhage in early pregnancy, unspecified: Secondary | ICD-10-CM

## 2020-01-01 LAB — GC/CHLAMYDIA PROBE AMP (~~LOC~~) NOT AT ARMC
Chlamydia: NEGATIVE
Comment: NEGATIVE
Comment: NORMAL
Neisseria Gonorrhea: NEGATIVE

## 2020-01-01 LAB — HCG, QUANTITATIVE, PREGNANCY: hCG, Beta Chain, Quant, S: 4664 m[IU]/mL — ABNORMAL HIGH (ref <5)

## 2020-01-01 MED ORDER — HYDROCODONE-ACETAMINOPHEN 5-325 MG PO TABS: 1.0000 | ORAL_TABLET | ORAL | 0 refills | Status: DC | PRN

## 2020-01-01 MED ORDER — ONDANSETRON 4 MG PO TBDP
4.0000 mg | ORAL_TABLET | Freq: Once | ORAL | Status: AC
  Administered 2020-01-01: 4 mg via ORAL
  Filled 2020-01-01: qty 1

## 2020-01-01 MED ORDER — HYDROMORPHONE HCL 1 MG/ML IJ SOLN
1.0000 mg | Freq: Once | INTRAMUSCULAR | Status: AC
  Administered 2020-01-01: 1 mg via INTRAMUSCULAR
  Filled 2020-01-01: qty 1

## 2020-01-01 NOTE — ED Notes (Signed)
Pt has returned from [place] via wheelchair. NAD noted at this time.

## 2020-01-01 NOTE — ED Notes (Signed)
Pt using bathroom at this time and calling out due to passing clot. Clot is medium in size, dr Lenard Lance assessing this and pt at this time.

## 2020-01-01 NOTE — ED Notes (Signed)
ED Provider at bedside. 

## 2020-01-01 NOTE — ED Notes (Signed)
Patient given update regarding wait. 

## 2020-01-01 NOTE — ED Provider Notes (Signed)
[facility] Emergency Department Provider Note  Time seen: 1:44 AM  I have reviewed the triage vital signs and the nursing notes.   HISTORY  Chief Complaint Threatened Miscarriage   HPI Gina Farmer is a 34 y.o. female with a past medical history of  bipolar, presents to the emergency department for vaginal bleeding.  According to the patient she recently found out she was pregnant, began having vaginal bleeding 2 days ago and went to the emergency department had an ultrasound showing a 5-week live IUP with a subchorionic hemorrhage.  However since going home she states the bleeding increased and today she was having lower abdominal pain/cramping along with passing clots at times.  So the patient returned back to the emergency department today for further evaluation.  Describes her pain as moderate cramping type pain.  Past Medical History:  Diagnosis Date  . Anxiety   . Back injury   . Bipolar 1 disorder ([place])   . Bipolar 2 disorder ([place])   . Bipolar 2 disorder ([place])   . Depression   . Morbid obesity ([place])   . Post traumatic stress disorder (PTSD)   . PTSD (post-traumatic stress disorder)     Patient Active Problem List   Diagnosis Date Noted  . Positive pregnancy test 12/12/2019  . Trichomoniasis 12/12/2019    Past Surgical History:  Procedure Laterality Date  . APPENDECTOMY  2009  . INTRAOCULAR LENS IMPLANT, SECONDARY    . LAPAROSCOPY  2003    Prior to Admission medications   Medication Sig Start Date End Date Taking? Authorizing Provider  fluticasone (FLONASE) 50 MCG/ACT nasal spray Place 2 sprays into both nostrils daily. 09/12/19   McVey, Gelene Mink, PA-C  hydrOXYzine (ATARAX) 10 MG/5ML syrup Take 20 mg by mouth every 6 (six) hours as needed.    [provider]  ondansetron (ZOFRAN ODT) 4 MG disintegrating tablet Take 1 tablet (4 mg total) by mouth every 8 (eight) hours as needed for nausea or vomiting. 12/30/19   Lajean Manes, CNM  promethazine (PHENERGAN) 25 MG tablet Take 1 tablet (25 mg total) by mouth every 6 (six) hours as needed for nausea or vomiting. 12/27/19   Jorje Guild, NP    Allergies  Allergen Reactions  . Trileptal [Oxcarbazepine] Swelling    Per pt, "leg swelling"  . Viibryd [Vilazodone Hcl] Swelling    Per pt "leg swelling"    Family History  Problem Relation Age of Onset  . Hypertension Father   . Heart disease Father   . Narcolepsy Father   . Obesity Father   . Hypertension Brother   . Cancer Maternal Aunt   . Obesity Maternal Aunt   . Obesity Paternal Aunt   . Heart disease Maternal Grandfather     Social History Social History   Tobacco Use  . Smoking status: Current Every Day Smoker    Packs/day: 0.50    Types: Cigarettes  . Smokeless tobacco: Never Used  Substance Use Topics  . Alcohol use: Not Currently    Comment: social  . Drug use: Yes    Types: Marijuana    Comment: LAST SMOKED ON 12-11-19    Review of Systems Constitutional: Negative for fever. Cardiovascular: Negative for chest pain. Respiratory: Negative for shortness of breath. Gastrointestinal: Lower abdominal cramping. Genitourinary: Vaginal bleeding with clot at times. Musculoskeletal: Negative for musculoskeletal complaints Neurological: Negative for headache All other ROS negative  ____________________________________________   PHYSICAL EXAM:  VITAL SIGNS: ED Triage  Vitals  Enc Vitals Group     BP 12/31/19 2326 (!) 155/75     Pulse Rate 12/31/19 2326 92     Resp 12/31/19 2326 18     Temp 12/31/19 2326 98.2 F (36.8 C)     Temp Source 12/31/19 2326 Oral     SpO2 12/31/19 2326 98 %     Weight 12/31/19 2328 280 lb (127 kg)     Height 12/31/19 2328 5\' 1"  (1.549 m)     Head Circumference --      Peak Flow --      Pain Score 12/31/19 2328 9     Pain Loc --      Pain Edu? --      Excl. in GC? --    Constitutional: Alert and oriented. Well appearing and in no  distress. Eyes: Normal exam ENT      Head: Normocephalic and atraumatic.      Mouth/Throat: Mucous membranes are moist. Cardiovascular: Normal rate, regular rhythm. Respiratory: Normal respiratory effort without tachypnea nor retractions. Breath sounds are clear Gastrointestinal: Soft, mild suprapubic tenderness otherwise benign abdomen.  No rebound guarding or distention. Musculoskeletal: Nontender with normal range of motion in all extremities. Neurologic:  Normal speech and language. No gross focal neurologic deficits Skin:  Skin is warm, dry and intact.  Psychiatric: Mood and affect are normal.   ____________________________________________  INITIAL IMPRESSION / ASSESSMENT AND PLAN / ED COURSE  Pertinent labs & imaging results that were available during my care of the patient were reviewed by me and considered in my medical decision making (see chart for details).   Patient presents to the emergency department for lower abdominal discomfort vaginal bleeding.  Patient had an ultrasound performed 2 days ago showing 5-week IUP with subchorionic hemorrhage, bleeding has increased along with cramping, beta hCG is downtrending most consistent with miscarriage in progress.  Given the patient's beta-hCG currently 4600 we will obtain a repeat ultrasound for confirmation.  We will treat with pain medication in the emergency department.  Patient agreeable to plan of care.  Ultrasound shows IUP no longer visualized.  Likely miscarriage in progress.  We will discharge patient with pain medication discussed my typical return precautions and have the patient follow-up with her PCP.  Gina Farmer was evaluated in Emergency Department on 01/01/2020 for the symptoms described in the history of present illness. She was evaluated in the context of the global COVID-19 pandemic, which necessitated consideration that the patient might be at risk for infection with the SARS-CoV-2 virus that causes COVID-19.  Institutional protocols and algorithms that pertain to the evaluation of patients at risk for COVID-19 are in a state of rapid change based on information released by regulatory bodies including the CDC and federal and state organizations. These policies and algorithms were followed during the patient's care in the ED.  ____________________________________________   FINAL CLINICAL IMPRESSION(S) / ED DIAGNOSES  Miscarriage   01/03/2020, MD 01/01/20 [phone]

## 2020-01-01 NOTE — ED Notes (Signed)
Pt taken to ultrasound

## 2020-01-02 ENCOUNTER — Other Ambulatory Visit: Payer: 59

## 2020-01-03 ENCOUNTER — Ambulatory Visit (INDEPENDENT_AMBULATORY_CARE_PROVIDER_SITE_OTHER): Payer: 59 | Admitting: Obstetrics and Gynecology

## 2020-01-03 ENCOUNTER — Other Ambulatory Visit: Payer: Self-pay

## 2020-01-03 ENCOUNTER — Encounter: Payer: Self-pay | Admitting: Obstetrics and Gynecology

## 2020-01-03 VITALS — BP 136/89 | HR 79 | Ht 61.5 in | Wt 281.0 lb

## 2020-01-03 DIAGNOSIS — O039 Complete or unspecified spontaneous abortion without complication: Secondary | ICD-10-CM | POA: Diagnosis not present

## 2020-01-03 NOTE — Progress Notes (Signed)
Obstetric Problem Visit    Chief Complaint:  Chief Complaint  Patient presents with  . ER follow up    miscarriage    History of Present Illness: Patient is a 34 y.o. G2P0010 [redacted]w[redacted]d presenting for first trimester bleeding.  The onset of bleeding was 12/29/2018  Is bleeding equal to or greater than normal menstrual flow:  Yes Any recent trauma:  No Recent intercourse:  No History of prior miscarriage:  No Prior ultrasound demonstrating IUP: emergency room visit on 12/30/2015.  Prior ultrasound demonstrating viable IUP:  Yes Prior Serum HCG:  Yes  12/12/2019 1,683 mIU/mL 12/15/2019 2,754 mIU/mL 12/21/2019 4,956 mIU/mL 12/30/2019 5,976 mIU/mL 12/31/2019 4,664 mIU/ml  Rh status: AB pos  Review of Systems: Review of Systems  Constitutional: Negative.   Gastrointestinal: Positive for abdominal pain.  Genitourinary: Negative.     Past Medical History:  Past Medical History:  Diagnosis Date  . Anxiety   . Back injury   . Bipolar 1 disorder ([facility])   . Bipolar 2 disorder ([facility])   . Bipolar 2 disorder ([facility])   . Depression   . Morbid obesity ([facility])   . Post traumatic stress disorder (PTSD)   . PTSD (post-traumatic stress disorder)     Past Surgical History:  Past Surgical History:  Procedure Laterality Date  . APPENDECTOMY  2009  . INTRAOCULAR LENS IMPLANT, SECONDARY    . LAPAROSCOPY  2003    Obstetric History: G2P0010  Family History:  Family History  Problem Relation Age of Onset  . Hypertension Father   . Heart disease Father   . Narcolepsy Father   . Obesity Father   . Hypertension Brother   . Cancer Maternal Aunt   . Obesity Maternal Aunt   . Obesity Paternal Aunt   . Heart disease Maternal Grandfather     Social History:  Social History   Socioeconomic History  . Marital status: Single    Spouse name: Not on file  . Number of children: Not on file  . Years of education: Not on file  . Highest education level: Not on file  Occupational History  . Not on  file  Tobacco Use  . Smoking status: Current Every Day Smoker    Packs/day: 0.50    Types: Cigarettes  . Smokeless tobacco: Never Used  Substance and Sexual Activity  . Alcohol use: Not Currently    Comment: social  . Drug use: Yes    Types: Marijuana    Comment: LAST SMOKED ON 12-11-19  . Sexual activity: Yes    Birth control/protection: None  Other Topics Concern  . Not on file  Social History Narrative  . Not on file   Social Determinants of Health   Financial Resource Strain:   . Difficulty of Paying Living Expenses: Not on file  Food Insecurity:   . Worried About Programme researcher, broadcasting/film/video in the Last Year: Not on file  . Ran Out of Food in the Last Year: Not on file  Transportation Needs:   . Lack of Transportation (Medical): Not on file  . Lack of Transportation (Non-Medical): Not on file  Physical Activity:   . Days of Exercise per Week: Not on file  . Minutes of Exercise per Session: Not on file  Stress:   . Feeling of Stress : Not on file  Social Connections:   . Frequency of Communication with Friends and Family: Not on file  . Frequency of Social Gatherings with Friends and Family: Not on  file  . Attends Religious Services: Not on file  . Active Member of Clubs or Organizations: Not on file  . Attends Archivist Meetings: Not on file  . Marital Status: Not on file  Intimate Partner Violence:   . Fear of Current or Ex-Partner: Not on file  . Emotionally Abused: Not on file  . Physically Abused: Not on file  . Sexually Abused: Not on file    Allergies:  Allergies  Allergen Reactions  . Trileptal [Oxcarbazepine] Swelling    Per pt, "leg swelling"  . Viibryd [Vilazodone Hcl] Swelling    Per pt "leg swelling"    Medications: Prior to Admission medications   Medication Sig Start Date End Date Taking? Authorizing Provider  fluticasone (FLONASE) 50 MCG/ACT nasal spray Place 2 sprays into both nostrils daily. 09/12/19   McVey, Gelene Mink, PA-C    HYDROcodone-acetaminophen (NORCO/VICODIN) 5-325 MG tablet Take 1 tablet by mouth every 4 (four) hours as needed. 01/01/20   Harvest Dark, MD  hydrOXYzine (ATARAX) 10 MG/5ML syrup Take 20 mg by mouth every 6 (six) hours as needed.    [provider]  ondansetron (ZOFRAN ODT) 4 MG disintegrating tablet Take 1 tablet (4 mg total) by mouth every 8 (eight) hours as needed for nausea or vomiting. 12/30/19   Lajean Manes, CNM  promethazine (PHENERGAN) 25 MG tablet Take 1 tablet (25 mg total) by mouth every 6 (six) hours as needed for nausea or vomiting. 12/27/19   Jorje Guild, NP    Physical Exam Vitals: Blood pressure 136/89, pulse 79, height 5' 1.5" (1.562 m), weight 281 lb (127.5 kg), last menstrual period 11/05/2019, unknown if currently breastfeeding.   breastfeeding. General: NAD, well nourished, appears stated age 34: normocephalic, anicteric Neurologic: Grossly intact Psychiatric: mood appropriate, affect full  Assessment: 34 y.o. G2P0010 [redacted]w[redacted]d presenting for evaluation of first trimester vaginal bleeding  Plan: Problem List Items Addressed This Visit    None    Visit Diagnoses    Complete abortion    -  Primary   Relevant Orders   Beta hCG quant (ref lab)      1) Based on serial HCG measurements, as well as follow up ultrasound findings consistent with completed abortion.   - repeat HCG today  2) Long standing history of menorrhagia and dysmenorrhea.  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  The patient is most interested in a hysterectomy although I counseled her regarding risks - was previously on nexplanon, will place at next visit if HCG negative at that time  3) The patient is Rh positive rhogam is therefore not indicated to decrease the risk rhesus alloimmunization.    4) Routine bleeding precautions were discussed with the patient prior the conclusion of today's visit.    Malachy Mood, MD, [place]  [facility], [facility] 01/03/2020, 9:04 AM

## 2020-01-04 LAB — BETA HCG QUANT (REF LAB): hCG Quant: 230 m[IU]/mL

## 2020-01-05 ENCOUNTER — Telehealth: Payer: Self-pay | Admitting: Obstetrics and Gynecology

## 2020-01-05 NOTE — Telephone Encounter (Signed)
Needs annual can be moved up given HCG drop.  She does not need to be an overbook with me though. She was admitted by [facility] in the hospital so we can also do the annual Nexplanon insertion with [facility] if she has sooner availability

## 2020-01-05 NOTE — Telephone Encounter (Signed)
Patient is schedule for  for annual and endoemtrial biopsy, nexplanon insertion on 01/10/20 at [facility] with AMS

## 2020-01-05 NOTE — Telephone Encounter (Signed)
Patient is reschedule to 01/10/20 with AMS on 01/10/20

## 2020-01-08 NOTE — Telephone Encounter (Signed)
Patient is reschedule to 01/18/20 in [place] with AMS

## 2020-01-10 ENCOUNTER — Ambulatory Visit: Payer: 59 | Admitting: Obstetrics and Gynecology

## 2020-01-18 ENCOUNTER — Other Ambulatory Visit (HOSPITAL_COMMUNITY)
Admission: RE | Admit: 2020-01-18 | Discharge: 2020-01-18 | Disposition: A | Discharge status: Home / Self Care | Payer: 59 | Source: Ambulatory Visit | Attending: Obstetrics and Gynecology | Admitting: Obstetrics and Gynecology

## 2020-01-18 ENCOUNTER — Ambulatory Visit (INDEPENDENT_AMBULATORY_CARE_PROVIDER_SITE_OTHER): Payer: 59 | Admitting: Obstetrics and Gynecology

## 2020-01-18 ENCOUNTER — Other Ambulatory Visit: Payer: Self-pay

## 2020-01-18 ENCOUNTER — Encounter: Payer: Self-pay | Admitting: Obstetrics and Gynecology

## 2020-01-18 VITALS — BP 132/83 | HR 70 | Ht 61.5 in | Wt 274.0 lb

## 2020-01-18 DIAGNOSIS — Z124 Encounter for screening for malignant neoplasm of cervix: Secondary | ICD-10-CM | POA: Insufficient documentation

## 2020-01-18 DIAGNOSIS — N939 Abnormal uterine and vaginal bleeding, unspecified: Secondary | ICD-10-CM

## 2020-01-18 DIAGNOSIS — Z3049 Encounter for surveillance of other contraceptives: Secondary | ICD-10-CM

## 2020-01-18 DIAGNOSIS — O039 Complete or unspecified spontaneous abortion without complication: Secondary | ICD-10-CM | POA: Diagnosis present

## 2020-01-18 DIAGNOSIS — N938 Other specified abnormal uterine and vaginal bleeding: Secondary | ICD-10-CM | POA: Diagnosis not present

## 2020-01-18 DIAGNOSIS — Z30017 Encounter for initial prescription of implantable subdermal contraceptive: Secondary | ICD-10-CM

## 2020-01-18 NOTE — Progress Notes (Signed)
Gynecology Abnormal Uterine Bleeding Initial Evaluation   Chief Complaint:  Chief Complaint  Patient presents with  . Gynecologic Exam    Endometrial biopsy  . Nexplanon insert    History of Present Illness:    Paitient is a 34 y.o. G2P0020 with longstanding history of chronic pelvic pain.  Presenting for follow up up abnormal uterine bleeding since her recent SAB.  Bleeding has subsided in the interim.  She is interested in hysterectomy and has had extensive past evaluations for her pelvic pain with no clearly identifiable etiology.  She did have improvement on depo leupron in the past.    Previous evaluation: Diagnostic laparoscopy 2003 by Dr. Greggory Keen at [facility] which was negative.  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   Patient does have history of prior LEEP in 2008.  Endometrial biopsy 08/10/2018 scant inactive endometriom with tubal metaplasia  Previous Treatment: depo provera, depo leupron, Mirena (removed same day as placement secondary to pain), nexplanon, pelvic physical floor therapy.  Review of Systems: Review of Systems  Constitutional: Negative.   Gastrointestinal: Positive for abdominal pain. Negative for constipation and diarrhea.  Genitourinary: Negative.     Past Medical History:  Past Medical History:  Diagnosis Date  . Anxiety   . Back injury   . Bipolar 1 disorder ([facility])   . Bipolar 2 disorder ([facility])   . Bipolar 2 disorder ([facility])   . Depression   . Morbid obesity ([facility])   . Post traumatic stress disorder (PTSD)   . PTSD (post-traumatic stress disorder)     Past Surgical History:  Past Surgical History:  Procedure Laterality Date  . APPENDECTOMY  2009  . INTRAOCULAR LENS IMPLANT, SECONDARY    . LAPAROSCOPY  2003    Obstetric History: G2P0020  Family History:  Family History  Problem Relation Age of Onset  . Hypertension Father   . Heart disease Father   . Narcolepsy  Father   . Obesity Father   . Hypertension Brother   . Cancer Maternal Aunt   . Obesity Maternal Aunt   . Obesity Paternal Aunt   . Heart disease Maternal Grandfather     Social History:  Social History   Socioeconomic History  . Marital status: Single    Spouse name: Not on file  . Number of children: Not on file  . Years of education: Not on file  . Highest education level: Not on file  Occupational History  . Not on file  Tobacco Use  . Smoking status: Current Every Day Smoker    Packs/day: 0.50    Types: Cigarettes  . Smokeless tobacco: Never Used  Substance and Sexual Activity  . Alcohol use: Not Currently    Comment: social  . Drug use: Yes    Types: Marijuana    Comment: LAST SMOKED ON 12-11-19  . Sexual activity: Yes    Birth control/protection: None  Other Topics Concern  . Not on file  Social History Narrative  . Not on file   Social Determinants of Health   Financial Resource Strain:   . Difficulty of Paying Living Expenses: Not on file  Food Insecurity:   . Worried About Programme researcher, broadcasting/film/video in the Last Year: Not on file  . Ran Out of Food in the Last Year: Not on file  Transportation Needs:   . Lack of Transportation (Medical): Not on file  . Lack of Transportation (Non-Medical):  Not on file  Physical Activity:   . Days of Exercise per Week: Not on file  . Minutes of Exercise per Session: Not on file  Stress:   . Feeling of Stress : Not on file  Social Connections:   . Frequency of Communication with Friends and Family: Not on file  . Frequency of Social Gatherings with Friends and Family: Not on file  . Attends Religious Services: Not on file  . Active Member of Clubs or Organizations: Not on file  . Attends Banker Meetings: Not on file  . Marital Status: Not on file  Intimate Partner Violence:   . Fear of Current or Ex-Partner: Not on file  . Emotionally Abused: Not on file  . Physically Abused: Not on file  . Sexually  Abused: Not on file    Allergies:  Allergies  Allergen Reactions  . Trileptal [Oxcarbazepine] Swelling    Per pt, "leg swelling"  . Viibryd [Vilazodone Hcl] Swelling    Per pt "leg swelling"    Medications: Prior to Admission medications   Medication Sig Start Date End Date Taking? Authorizing Provider  hydrOXYzine (ATARAX) 10 MG/5ML syrup Take 20 mg by mouth every 6 (six) hours as needed.   Yes [provider]  ondansetron (ZOFRAN ODT) 4 MG disintegrating tablet Take 1 tablet (4 mg total) by mouth every 8 (eight) hours as needed for nausea or vomiting. 12/30/19  Yes Sharyon Cable, CNM    Physical Exam Blood pressure 132/83, pulse 70, height 5' 1.5" (1.562 m), weight 274 lb (124.3 kg), last menstrual period 11/05/2019, unknown if currently breastfeeding.  Patient's last menstrual period was 11/05/2019.  General: NAD, well nourished, appears stated age HEENT: normocephalic, anicteric Pulmonary: No increased work of breathing Genitourinary:  External: Normal external female genitalia.  Normal urethral meatus, normal Bartholin's and Skene's glands.    Vagina: Normal vaginal mucosa, no evidence of prolapse.    Cervix: Grossly normal in appearance, no bleeding  Uterus: Non-enlarged, mobile, normal contour.  No CMT  Adnexa: ovaries non-enlarged, no adnexal masses  Rectal: deferred  Lymphatic: no evidence of inguinal lymphadenopathy Extremities: no edema, erythema, or tenderness Neurologic: Grossly intact Psychiatric: mood appropriate, affect full  Female chaperone present for pelvic portions of the physical exam   GYNECOLOGY PROCEDURE NOTE  Patient is a 34 y.o. [id number] presenting for Nexplanon insertion as her desires means of contraception.  She provided informed consent, signed copy in the chart, time out was performed. This is a placement post SAB.    She understands that Nexplanon is a progesterone only therapy, and that patients often patients have irregular  and unpredictable vaginal bleeding or amenorrhea. She understands that other side effects are possible related to systemic progesterone, including but not limited to, headaches, breast tenderness, nausea, and irritability. While effective at preventing pregnancy long acting reversible contraceptives do not prevent transmission of sexually transmitted diseases and use of barrier methods for this purpose was discussed. The placement procedure for Nexplanon was reviewed with the patient in detail including risks of nerve injury, infection, bleeding and injury to other muscles or tendons. She understands that the Nexplanon implant is good for 3 years and needs to be removed at the end of that time.  She understands that Nexplanon is an extremely effective option for contraception, with failure rate of <1%. This information is reviewed today and all questions were answered. Informed consent was obtained, both verbally and written.   The patient is healthy and has no  contraindications to Implanon use. Urine pregnancy test was performed today and was negative.  Procedure Appropriate time out taken.  Patient placed in dorsal supine with left arm above head, elbow flexed at 90 degrees, arm resting on examination table.  The bicipital grove was palpated and site 8-10cm proximal to the medial epicondyle was indentified . The insertion site was prepped with a two betadine swabs and then injected with 3 cc of 1% lidocaine without epinephrine.  Nexplanon removed form sterile blister packaging,  Device confirmed in needle, before inserting full length of needle, tenting up the skin as the needle was advance.  The drug eluting rod was then deployed by pulling back the slider per the manufactures recommendation.  The implant was palpable by the clinician as well as the patient.  The insertion site covered dressed with a band aid before applying  a kerlex bandage pressure dressing..Minimal blood loss was noted during the  procedure.  The patientt tolerated the procedure well.   She was instructed to wear the bandage for 24 hours, call with any signs of infection.  She was given the Implanon card and instructed to have the rod removed in 3 years.  ENDOMETRIAL BIOPSY     The indications for endometrial biopsy were reviewed.   Risks of the biopsy including cramping, bleeding, infection, uterine perforation, inadequate specimen and need for additional procedures  were discussed. The patient states she understands and agrees to undergo procedure today. Consent was signed. Time out was performed. Urine HCG was negative. A Graves speculum was placed and the cervix was brought into view.  The cervix was prepped with Betadine. A single-toothed tenaculum was  placed on the anterior lip of the cervix for traction. A 3 mm pipelle was introduced through the cervix into the endometrial cavity without difficulty to a depth of 9 cm, and a moderate amount of tissue was obtained, the resulting specime sent to pathology. The instruments were removed from the patient's vagina. Minimal bleeding from the cervix was noted. The patient tolerated the procedure well. Routine post-procedure instructions were given to the patient.  She will be contacted by phone one results become available.     Assessment: 34 y.o. G2P0020 with abnormal uterine bleeding  Plan: Problem List Items Addressed This Visit    None    Visit Diagnoses    Abnormal uterine bleeding    -  Primary   Relevant Orders   Surgical pathology   Complete abortion       Relevant Orders   Surgical pathology   Nexplanon insertion       Screening for malignant neoplasm of cervix       Relevant Orders   Cytology - PAP      1) AUB - has stopped suspect secondary to recent completed abortion.  She does report heavier menses and dysmenorrhea preceeding.  Does have a family history of ovarian and uterine cancer per her report, and risk factor of morbid obesity Body mass index is  50.93 kg/m.Marland Kitchen The patient was previously counseled that she would have to get BIMI under 50 in order to be considered candidate for hysterectomy at [facility]. The patient previously had a nexplanon and did well with this as far as cycle control.  She is most interested in a hysterectomy but I feel the surgical risks are to great to warrant this as first line approach.  Will evaluate response to Nexplanon in 3 months - pap obtained - endometrial biopsy obtained  2) A total  of 15 minutes were spent in face-to-face contact with the patient during this encounter with over half of that time devoted to counseling and coordination of care.  3) Return in about 3 months (around 04/16/2020) for nexplanon follow up.   Malachy Mood, MD, [facility], [place]

## 2020-01-20 ENCOUNTER — Other Ambulatory Visit: Payer: Self-pay

## 2020-01-20 ENCOUNTER — Encounter: Payer: Self-pay | Admitting: Emergency Medicine

## 2020-01-20 ENCOUNTER — Emergency Department
Admission: EM | Admit: 2020-01-20 | Discharge: 2020-01-20 | Disposition: A | Discharge status: Home / Self Care | Payer: 59 | Attending: Emergency Medicine | Admitting: Emergency Medicine

## 2020-01-20 ENCOUNTER — Emergency Department: Payer: 59

## 2020-01-20 DIAGNOSIS — J020 Streptococcal pharyngitis: Secondary | ICD-10-CM | POA: Diagnosis not present

## 2020-01-20 DIAGNOSIS — J029 Acute pharyngitis, unspecified: Secondary | ICD-10-CM | POA: Diagnosis present

## 2020-01-20 DIAGNOSIS — F17210 Nicotine dependence, cigarettes, uncomplicated: Secondary | ICD-10-CM | POA: Diagnosis not present

## 2020-01-20 LAB — GROUP A STREP BY PCR: Group A Strep by PCR: DETECTED — AB

## 2020-01-20 MED ORDER — METHYLPREDNISOLONE SODIUM SUCC 125 MG IJ SOLR
125.0000 mg | Freq: Once | INTRAMUSCULAR | Status: AC
  Administered 2020-01-20: 125 mg via INTRAMUSCULAR
  Filled 2020-01-20: qty 2

## 2020-01-20 MED ORDER — LIDOCAINE VISCOUS HCL 2 % MT SOLN: 5.0000 mL | Freq: Four times a day (QID) | OROMUCOSAL | 0 refills | Status: DC | PRN

## 2020-01-20 MED ORDER — METHYLPREDNISOLONE 4 MG PO TBPK: ORAL_TABLET | ORAL | 0 refills | Status: DC

## 2020-01-20 MED ORDER — PENICILLIN G BENZATHINE & PROC 1200000 UNIT/2ML IM SUSP
1.2000 10*6.[IU] | Freq: Once | INTRAMUSCULAR | Status: AC
  Administered 2020-01-20: 1.2 10*6.[IU] via INTRAMUSCULAR
  Filled 2020-01-20: qty 2

## 2020-01-20 MED ORDER — DIPHENHYDRAMINE HCL 12.5 MG/5ML PO ELIX
12.5000 mg | ORAL_SOLUTION | Freq: Once | ORAL | Status: AC
  Administered 2020-01-20: 12.5 mg via ORAL
  Filled 2020-01-20: qty 5

## 2020-01-20 MED ORDER — LIDOCAINE VISCOUS HCL 2 % MT SOLN
15.0000 mL | Freq: Once | OROMUCOSAL | Status: AC
  Administered 2020-01-20: 15 mL via OROMUCOSAL
  Filled 2020-01-20: qty 15

## 2020-01-20 NOTE — ED Notes (Signed)
Strep swab not completed due to no swab available in the ED at this time. Lab called

## 2020-01-20 NOTE — ED Notes (Signed)
See triage note  Presents with sore throat for about 5 days  States she feel like her throat is swollen on one side  Having some diff swallowing this am  Afebrile on arrival

## 2020-01-20 NOTE — ED Notes (Signed)
Strep swap sent from lab and test completed by this EDT

## 2020-01-20 NOTE — Discharge Instructions (Signed)
Follow discharge care instruction take medication as directed.  Start Medrol Dosepak tomorrow morning.

## 2020-01-20 NOTE — ED Triage Notes (Signed)
Pt arrives POV to triage with c/o sore throat x 4-5 days.

## 2020-01-20 NOTE — ED Provider Notes (Signed)
[facility] Emergency Department Provider Note   ____________________________________________   None    (approximate)  I have reviewed the triage vital signs and the nursing notes.   HISTORY  Chief Complaint Sore Throat    HPI Gina Farmer is a 34 y.o. female patient presents with sore throat and dysphagia for 4 days.  Patient denies URI signs or symptoms.  Patient states this morning she is having trouble handling oral secretions.  Denies fever.  Denies recent travel or known contact with COVID-19.  Rates the pain as a 6/10.  Described the pain as "sore".  No palliative visual complaint.         Past Medical History:  Diagnosis Date  . Anxiety   . Back injury   . Bipolar 1 disorder ([place])   . Bipolar 2 disorder ([place])   . Bipolar 2 disorder ([place])   . Depression   . Morbid obesity ([place])   . Post traumatic stress disorder (PTSD)   . PTSD (post-traumatic stress disorder)     Patient Active Problem List   Diagnosis Date Noted  . Positive pregnancy test 12/12/2019  . Trichomoniasis 12/12/2019    Past Surgical History:  Procedure Laterality Date  . APPENDECTOMY  2009  . INTRAOCULAR LENS IMPLANT, SECONDARY    . LAPAROSCOPY  2003    Prior to Admission medications   Medication Sig Start Date End Date Taking? Authorizing Provider  hydrOXYzine (ATARAX) 10 MG/5ML syrup Take 20 mg by mouth every 6 (six) hours as needed.    [provider]  lidocaine (XYLOCAINE) 2 % solution Use as directed 5 mLs in the mouth or throat every 6 (six) hours as needed for mouth pain. Oral swish and slow swallow. 01/20/20   Sable Feil, PA-C  methylPREDNISolone (MEDROL DOSEPAK) 4 MG TBPK tablet Take Tapered dose as directed 01/20/20   Sable Feil, PA-C    Allergies Trileptal [oxcarbazepine] and Arman Filter hcl]  Family History  Problem Relation Age of Onset  . Hypertension Father   . Heart disease Father   . Narcolepsy Father   . Obesity  Father   . Hypertension Brother   . Cancer Maternal Aunt   . Obesity Maternal Aunt   . Obesity Paternal Aunt   . Heart disease Maternal Grandfather     Social History Social History   Tobacco Use  . Smoking status: Current Every Day Smoker    Packs/day: 0.50    Types: Cigarettes  . Smokeless tobacco: Never Used  Substance Use Topics  . Alcohol use: Not Currently    Comment: social  . Drug use: Yes    Types: Marijuana    Comment: LAST SMOKED ON 12-11-19    Review of Systems Constitutional: No fever/chills Eyes: No visual changes. ENT: Sore throat. Cardiovascular: Denies chest pain. Respiratory: Denies shortness of breath. Gastrointestinal: No abdominal pain.  No nausea, no vomiting.  No diarrhea.  No constipation. Genitourinary: Negative for dysuria. Musculoskeletal: Negative for back pain. Skin: Negative for rash. Neurological: Negative for headaches, focal weakness or numbness. Psychiatric:  Anxiety, bipolar, depression, and PTSD. Endocrine:  Hematological/Lymphatic:  Allergic/Immunilogical: Trileptal and and Viibryd  ____________________________________________   PHYSICAL EXAM:  VITAL SIGNS: ED Triage Vitals  Enc Vitals Group     BP 01/20/20 0647 130/84     Pulse Rate 01/20/20 0647 97     Resp 01/20/20 0647 18     Temp 01/20/20 0647 98.1 F (36.7 C)     Temp  Source 01/20/20 0647 Oral     SpO2 01/20/20 0647 98 %     Weight 01/20/20 0638 274 lb (124.3 kg)     Height 01/20/20 0638 5\' 1"  (1.549 m)     Head Circumference --      Peak Flow --      Pain Score 01/20/20 0638 6     Pain Loc --      Pain Edu? --      Excl. in GC? --     Constitutional: Alert and oriented. Well appearing and in no acute distress. Nose: No congestion/rhinnorhea. Mouth/Throat: Mucous membranes are moist.  Oropharynx erythematous. Neck: No stridor.   Hematological/Lymphatic/Immunilogical: Right cervical lymphadenopathy. Cardiovascular: Normal rate, regular rhythm. Grossly  normal heart sounds.  Good peripheral circulation. Respiratory: Normal respiratory effort.  No retractions. Lungs CTAB. Skin:  Skin is warm, dry and intact. No rash noted. Psychiatric: Mood and affect are normal. Speech and behavior are normal.  ____________________________________________   LABS (all labs ordered are listed, but only abnormal results are displayed)  Labs Reviewed  GROUP A STREP BY PCR - Abnormal; Notable for the following components:      Result Value   Group A Strep by PCR DETECTED (*)    All other components within normal limits   ____________________________________________  EKG   ____________________________________________  RADIOLOGY  ED MD interpretation:    Official radiology report(s): DG Neck Soft Tissue  Result Date: 01/20/2020 CLINICAL DATA:  Sore throat and solid food dysphagia for 3 days. EXAM: NECK SOFT TISSUES - 1+ VIEW COMPARISON:  None. FINDINGS: There is no evidence of retropharyngeal soft tissue swelling or epiglottic enlargement. The cervical airway is unremarkable and no radio-opaque foreign body identified. A large anterior osteophyte at C5-6 is noted. IMPRESSION: No acute abnormality.  No prevertebral soft tissue swelling. Large anterior osteophyte at C5-6 could cause swallowing symptoms. Electronically Signed   By: 03/19/2020 M.D.   On: 01/20/2020 08:10    ____________________________________________   PROCEDURES  Procedure(s) performed (including Critical Care):  Procedures   ____________________________________________   INITIAL IMPRESSION / ASSESSMENT AND PLAN / ED COURSE  As part of my medical decision making, I reviewed the following data within the electronic MEDICAL RECORD NUMBER     Patient presents with sore throat and dysphagia for the 4 days.  Differential consist of a strep pharyngitis, viral pharyngitis, and epiglottitis.  Discussed rapid strep test and soft tissue neck x-ray results with patient.  Physical exam is  consistent with strep pharyngitis.  Patient's dysphagia probally secondary to osteophyte at C5-C6.  Patient given 1.2 mu of penicillin 125 mg Solu-Medrol.  Patient given discharge care instruction advised take medication as directed.  Advised to [facility] with [facility].    YOHANA BARTHA was evaluated in Emergency Department on 01/20/2020 for the symptoms described in the history of present illness. She was evaluated in the context of the global COVID-19 pandemic, which necessitated consideration that the patient might be at risk for infection with the SARS-CoV-2 virus that causes COVID-19. Institutional protocols and algorithms that pertain to the evaluation of patients at risk for COVID-19 are in a state of rapid change based on information released by regulatory bodies including the CDC and federal and state organizations. These policies and algorithms were followed during the patient's care in the ED.       ____________________________________________   FINAL CLINICAL IMPRESSION(S) / ED DIAGNOSES  Final diagnoses:  Strep pharyngitis     ED Discharge  Orders         Ordered    lidocaine (XYLOCAINE) 2 % solution  Every 6 hours PRN     01/20/20 0812    methylPREDNISolone (MEDROL DOSEPAK) 4 MG TBPK tablet     01/20/20 8756           Note:  This document was prepared using Dragon voice recognition software and may include unintentional dictation errors.    Joni Reining, PA-C 01/20/20 4332    Emily Filbert, MD 01/20/20 [phone]

## 2020-01-22 LAB — SURGICAL PATHOLOGY

## 2020-01-23 LAB — CYTOLOGY - PAP
Adequacy: ABSENT
Comment: NEGATIVE
Diagnosis: UNDETERMINED — AB
High risk HPV: NEGATIVE

## 2020-01-29 ENCOUNTER — Encounter: Payer: Self-pay | Admitting: Obstetrics and Gynecology

## 2020-02-01 ENCOUNTER — Ambulatory Visit: Payer: 59 | Admitting: Obstetrics and Gynecology

## 2020-02-05 ENCOUNTER — Encounter: Payer: Self-pay | Admitting: Obstetrics and Gynecology

## 2020-02-11 ENCOUNTER — Other Ambulatory Visit: Payer: Self-pay

## 2020-02-11 ENCOUNTER — Emergency Department
Admission: EM | Admit: 2020-02-11 | Discharge: 2020-02-11 | Disposition: A | Discharge status: Home / Self Care | Payer: 59 | Attending: Emergency Medicine | Admitting: Emergency Medicine

## 2020-02-11 ENCOUNTER — Emergency Department: Payer: 59

## 2020-02-11 DIAGNOSIS — F17210 Nicotine dependence, cigarettes, uncomplicated: Secondary | ICD-10-CM | POA: Diagnosis not present

## 2020-02-11 DIAGNOSIS — R0789 Other chest pain: Secondary | ICD-10-CM | POA: Insufficient documentation

## 2020-02-11 DIAGNOSIS — R0782 Intercostal pain: Secondary | ICD-10-CM

## 2020-02-11 DIAGNOSIS — R079 Chest pain, unspecified: Secondary | ICD-10-CM | POA: Diagnosis present

## 2020-02-11 LAB — BASIC METABOLIC PANEL
Anion gap: 10 (ref 5–15)
BUN: 19 mg/dL (ref 6–20)
CO2: 22 mmol/L (ref 22–32)
Calcium: 9 mg/dL (ref 8.9–10.3)
Chloride: 107 mmol/L (ref 98–111)
Creatinine, Ser: 0.79 mg/dL (ref 0.44–1.00)
GFR calc Af Amer: >60 mL/min (ref >60)
GFR calc non Af Amer: >60 mL/min (ref >60)
Glucose, Bld: 118 mg/dL — ABNORMAL HIGH (ref 70–99)
Potassium: 3.6 mmol/L (ref 3.5–5.1)
Sodium: 139 mmol/L (ref 135–145)

## 2020-02-11 LAB — TROPONIN I (HIGH SENSITIVITY)
Troponin I (High Sensitivity): 4 ng/L (ref <18)
Troponin I (High Sensitivity): 3 ng/L (ref <18)

## 2020-02-11 LAB — CBC
HCT: 42 % (ref 36.0–46.0)
Hemoglobin: 13.9 g/dL (ref 12.0–15.0)
MCH: 29.6 pg (ref 26.0–34.0)
MCHC: 33.1 g/dL (ref 30.0–36.0)
MCV: 89.6 fL (ref 80.0–100.0)
Platelets: 300 10*3/uL (ref 150–400)
RBC: 4.69 MIL/uL (ref 3.87–5.11)
RDW: 13.7 % (ref 11.5–15.5)
WBC: 14 10*3/uL — ABNORMAL HIGH (ref 4.0–10.5)
nRBC: 0 % (ref 0.0–0.2)

## 2020-02-11 LAB — POCT PREGNANCY, URINE: Preg Test, Ur: NEGATIVE

## 2020-02-11 MED ORDER — MELOXICAM 15 MG PO TABS: 15.0000 mg | ORAL_TABLET | Freq: Every day | ORAL | 0 refills | Status: DC

## 2020-02-11 NOTE — ED Notes (Signed)
Pt taken for xray

## 2020-02-11 NOTE — ED Triage Notes (Signed)
FIRST NURSE NOTE:  Pt c/o chest pain for the past hour, mid chest, sharp, states never has last this long.

## 2020-02-11 NOTE — ED Provider Notes (Signed)
[facility] Emergency Department Provider Note  ____________________________________________  Time seen: Approximately 12:01 PM  I have reviewed the triage vital signs and the nursing notes.   HISTORY  Chief Complaint Chest Pain    HPI Gina Farmer is a 34 y.o. female who presents the emergency department complaining of chest pain.  Patient states that she does have a history of recurrent chest pain, typically exacerbated by anxiety.  Patient states that she has known triggers, typically has sharp onset but quick relief of her chest pain when it is a panic attack.  Patient states that she does not have any known triggers.  She states that in fact she has had some good news recently with a raise at one of her jobs, was hired full-time at a second job that she was really hoping for.  Patient states that this morning she had no triggers, was not doing any significant activity when she began to experience center to right side chest pain.  No radiation down her arm or to her neck but she states that she can also feel in her back.  She states however that she does have chronic back pain, does heavy lifting at work.  Patient with no shortness of breath.  No URI symptoms.  Patient with no actual cardiac history in herself, though she did have her father died at 22 from a massive heart attack.  No alleviating factors at home.  Patient denies any palpitations or pleuritic pain.         Past Medical History:  Diagnosis Date  . Anxiety   . Back injury   . Bipolar 1 disorder ([place])   . Bipolar 2 disorder ([place])   . Bipolar 2 disorder ([place])   . Depression   . Family history of ovarian cancer    2/21 cancer genetic testing letter sent  . Morbid obesity ([place])   . Post traumatic stress disorder (PTSD)   . PTSD (post-traumatic stress disorder)     Patient Active Problem List   Diagnosis Date Noted  . Positive pregnancy test 12/12/2019  . Trichomoniasis 12/12/2019     Past Surgical History:  Procedure Laterality Date  . APPENDECTOMY  2009  . INTRAOCULAR LENS IMPLANT, SECONDARY    . LAPAROSCOPY  2003    Prior to Admission medications   Medication Sig Start Date End Date Taking? Authorizing Provider  hydrOXYzine (ATARAX) 10 MG/5ML syrup Take 20 mg by mouth every 6 (six) hours as needed.    [provider]  lidocaine (XYLOCAINE) 2 % solution Use as directed 5 mLs in the mouth or throat every 6 (six) hours as needed for mouth pain. Oral swish and slow swallow. 01/20/20   Sable Feil, PA-C  meloxicam (MOBIC) 15 MG tablet Take 1 tablet (15 mg total) by mouth daily. 02/11/20   Kanaya Gunnarson, Charline Bills, PA-C  methylPREDNISolone (MEDROL DOSEPAK) 4 MG TBPK tablet Take Tapered dose as directed 01/20/20   Sable Feil, PA-C    Allergies Trileptal [oxcarbazepine] and Viibryd [vilazodone hcl]  Family History  Problem Relation Age of Onset  . Hypertension Father   . Heart disease Father   . Narcolepsy Father   . Obesity Father   . Hypertension Brother   . Obesity Maternal Aunt   . Ovarian cancer Maternal Aunt 7  . Obesity Paternal Aunt   . Heart disease Maternal Grandfather   . Ovarian cancer Paternal Grandmother        28s  . Uterine  cancer Other     Social History Social History   Tobacco Use  . Smoking status: Current Every Day Smoker    Packs/day: 0.50    Types: Cigarettes  . Smokeless tobacco: Never Used  Substance Use Topics  . Alcohol use: Not Currently    Comment: social  . Drug use: Yes    Types: Marijuana    Comment: LAST SMOKED ON 12-11-19     Review of Systems  Constitutional: No fever/chills Eyes: No visual changes. No discharge ENT: No upper respiratory complaints. Cardiovascular: Center and right-sided chest pain radiating to the back. Respiratory: no cough. No SOB. Gastrointestinal: No abdominal pain.  No nausea, no vomiting.  No diarrhea.  No constipation. Genitourinary: Negative for dysuria. No  hematuria Musculoskeletal: Negative for musculoskeletal pain. Skin: Negative for rash, abrasions, lacerations, ecchymosis. Neurological: Negative for headaches, focal weakness or numbness. 10-point ROS otherwise negative.  ____________________________________________   PHYSICAL EXAM:  VITAL SIGNS: ED Triage Vitals  Enc Vitals Group     BP 02/11/20 1036 137/64     Pulse Rate 02/11/20 1036 81     Resp 02/11/20 1036 16     Temp 02/11/20 1036 99 F (37.2 C)     Temp Source 02/11/20 1036 Oral     SpO2 02/11/20 1036 96 %     Weight 02/11/20 1037 260 lb (117.9 kg)     Height 02/11/20 1037 5\' 1"  (1.549 m)     Head Circumference --      Peak Flow --      Pain Score 02/11/20 1037 6     Pain Loc --      Pain Edu? --      Excl. in GC? --      Constitutional: Alert and oriented. Well appearing and in no acute distress. Eyes: Conjunctivae are normal. PERRL. EOMI. Head: Atraumatic. ENT:      Ears:       Nose: No congestion/rhinnorhea.      Mouth/Throat: Mucous membranes are moist.  Neck: No stridor.  No cervical spine tenderness to palpation. Hematological/Lymphatic/Immunilogical: No cervical lymphadenopathy. Cardiovascular: Normal rate, regular rhythm. Normal S1 and S2.  No murmurs, rubs, gallops.  Good peripheral circulation.  No apical heave. Respiratory: Normal respiratory effort without tachypnea or retractions. Lungs CTAB. Good air entry to the bases with no decreased or absent breath sounds. Gastrointestinal: Bowel sounds 4 quadrants. Soft and nontender to palpation. No guarding or rigidity. No palpable masses. No distention. No CVA tenderness. Musculoskeletal: Full range of motion to all extremities. No gross deformities appreciated.  Visualization of the chest wall reveals no visible abnormality.  With palpation, patient does have tenderness along the right sternal border extending into the intercostal margins.  This intercostal tenderness extends all the way to the patient's  back.  No palpable abnormality.  No crepitus.  No subcutaneous emphysema.  Good underlying breath sounds bilaterally. Neurologic:  Normal speech and language. No gross focal neurologic deficits are appreciated.  Skin:  Skin is warm, dry and intact. No rash noted. Psychiatric: Mood and affect are normal. Speech and behavior are normal. Patient exhibits appropriate insight and judgement.   ____________________________________________   LABS (all labs ordered are listed, but only abnormal results are displayed)  Labs Reviewed  BASIC METABOLIC PANEL - Abnormal; Notable for the following components:      Result Value   Glucose, Bld 118 (*)    All other components within normal limits  CBC - Abnormal; Notable for the following components:  WBC 14.0 (*)    All other components within normal limits  POC URINE PREG, ED  POCT PREGNANCY, URINE  TROPONIN I (HIGH SENSITIVITY)  TROPONIN I (HIGH SENSITIVITY)   ____________________________________________  EKG  ED ECG REPORT I, Delorise Royals Martavius Lusty,  personally viewed and interpreted this ECG.   Date: 02/11/2020  EKG Time: 1034 hrs.  Rate: 83 bpm  Rhythm: normal EKG, normal sinus rhythm, unchanged from previous tracings  Axis: Normal axis  Intervals:none  ST&T Change: No ST elevation or depression noted  Normal sinus rhythm.  No STEMI.  No change from previous EKG.   ____________________________________________  RADIOLOGY I personally viewed and evaluated these images as part of my medical decision making, as well as reviewing the written report by the radiologist.  DG Chest 2 View  Result Date: 02/11/2020 CLINICAL DATA:  Chest pain EXAM: CHEST - 2 VIEW COMPARISON:  05/25/2017 FINDINGS: The heart size and mediastinal contours are within normal limits. Both lungs are clear. The visualized skeletal structures are unremarkable. IMPRESSION: No acute abnormality of the lungs. Electronically Signed   By: Lauralyn Primes M.D.   On: 02/11/2020  12:05    ____________________________________________    PROCEDURES  Procedure(s) performed:    Procedures    Medications - No data to display   ____________________________________________   INITIAL IMPRESSION / ASSESSMENT AND PLAN / ED COURSE  Pertinent labs & imaging results that were available during my care of the patient were reviewed by me and considered in my medical decision making (see chart for details).  Review of the [place] CSRS was performed in accordance of the [facility] prior to dispensing any controlled drugs.           Patient's diagnosis is consistent with intercostal pain.  Patient presented to the emergency department complaining of center and right-sided chest pain.  Patient does have a history of chest pain with anxiety but did not have any of her normal triggers.  On exam, patient had appreciable, reproducible pain with palpation along the intercostal margins extending into the right sternal border.  Exam was otherwise reassuring.  EKG, chest x-ray, labs are reassuring at this time.  No evidence of cardiac related chest pain.  This time, I will place the patient on symptom control medications of anti-inflammatories for the intercostal pain.  Follow-up primary care as needed.  No further work-up at this time..  Patient is given ED precautions to return to the ED for any worsening or new symptoms.     ____________________________________________  FINAL CLINICAL IMPRESSION(S) / ED DIAGNOSES  Final diagnoses:  Intercostal pain      NEW MEDICATIONS STARTED DURING THIS VISIT:  ED Discharge Orders         Ordered    meloxicam (MOBIC) 15 MG tablet  Daily     02/11/20 1404              This chart was dictated using voice recognition software/Dragon. Despite best efforts to proofread, errors can occur which can change the meaning. Any change was purely unintentional.    Racheal Patches, PA-C 02/11/20 1404    Dionne Bucy,  MD 02/11/20 1452

## 2020-02-11 NOTE — ED Triage Notes (Signed)
Sharp chest pain to center of chest radiating to upper mid back. Started approx 1 hour ago. Hx of same feeling. Pt alert and oriented X4, cooperative, RR even and unlabored, color WNL. Pt in NAD.

## 2020-02-20 ENCOUNTER — Encounter: Payer: 59 | Admitting: Podiatry

## 2020-02-20 ENCOUNTER — Ambulatory Visit: Payer: 59

## 2020-03-08 ENCOUNTER — Other Ambulatory Visit: Payer: Self-pay | Admitting: Obstetrics and Gynecology

## 2020-04-07 NOTE — Progress Notes (Signed)
This encounter was created in error - please disregard.

## 2020-04-16 ENCOUNTER — Encounter: Payer: Self-pay | Admitting: Obstetrics and Gynecology

## 2020-04-16 ENCOUNTER — Other Ambulatory Visit: Payer: Self-pay

## 2020-04-16 ENCOUNTER — Ambulatory Visit (INDEPENDENT_AMBULATORY_CARE_PROVIDER_SITE_OTHER): Payer: 59 | Admitting: Obstetrics and Gynecology

## 2020-04-16 VITALS — BP 128/72 | HR 85 | Wt 279.0 lb

## 2020-04-16 DIAGNOSIS — Z131 Encounter for screening for diabetes mellitus: Secondary | ICD-10-CM | POA: Diagnosis not present

## 2020-04-16 DIAGNOSIS — N939 Abnormal uterine and vaginal bleeding, unspecified: Secondary | ICD-10-CM | POA: Diagnosis not present

## 2020-04-16 DIAGNOSIS — R102 Pelvic and perineal pain unspecified side: Secondary | ICD-10-CM

## 2020-04-16 DIAGNOSIS — N941 Unspecified dyspareunia: Secondary | ICD-10-CM

## 2020-04-16 MED ORDER — MELOXICAM 15 MG PO TABS: 15.0000 mg | ORAL_TABLET | Freq: Every day | ORAL | 2 refills | Status: DC

## 2020-04-16 NOTE — Progress Notes (Signed)
Obstetrics & Gynecology Office Visit   Chief Complaint:  Chief Complaint  Patient presents with  . Follow-up    Bleeding w/cramping, clots    History of Present Illness: 34 y.o. G2P0020 presenting for follow up of abnormal uterine bleeding and dysmenorrhea in the setting of Nexplanon insertion in the past 3 months (01/18/2020).  The patient reports that she has had off an on spotting in the past month with accompanying dysmenorrhea.  She does have a history of chronic pelvic pain with last diagnostic laparoscopy 2003.  She does report worse pain around her right ovary, RLQ.  This radiates into the groin and right back.  She has been taking ibuprofen without relieve of symptom, 8/10. She does report dyspareunia with penetration as well as after intercourse.  The patient has expressed interested in hysterectomy in the past although we have had the discussion of pros and cons as well as risks associated with having a major surgical intervention.     Review of Systems: Review of Systems  Gastrointestinal: Positive for abdominal pain and nausea. Negative for blood in stool, constipation, diarrhea, heartburn, melena and vomiting.  Genitourinary: Negative.      Past Medical History:  Past Medical History:  Diagnosis Date  . Anxiety   . Back injury   . Bipolar 1 disorder (HCC)   . Bipolar 2 disorder (HCC)   . Bipolar 2 disorder (HCC)   . Depression   . Family history of ovarian cancer    2/21 cancer genetic testing letter sent  . Morbid obesity (HCC)   . Post traumatic stress disorder (PTSD)   . PTSD (post-traumatic stress disorder)     Past Surgical History:  Past Surgical History:  Procedure Laterality Date  . APPENDECTOMY  2009  . INTRAOCULAR LENS IMPLANT, SECONDARY    . LAPAROSCOPY  2003    Gynecologic History: No LMP recorded. Patient has had an implant.  Obstetric History: G2P0020  Family History:  Family History  Problem Relation Age of Onset  . Hypertension  Father   . Heart disease Father   . Narcolepsy Father   . Obesity Father   . Hypertension Brother   . Obesity Maternal Aunt   . Ovarian cancer Maternal Aunt 40  . Obesity Paternal Aunt   . Heart disease Maternal Grandfather   . Ovarian cancer Paternal Grandmother        5s  . Uterine cancer Other     Social History:  Social History   Socioeconomic History  . Marital status: Single    Spouse name: Not on file  . Number of children: Not on file  . Years of education: Not on file  . Highest education level: Not on file  Occupational History  . Not on file  Tobacco Use  . Smoking status: Current Every Day Smoker    Packs/day: 0.50    Types: Cigarettes  . Smokeless tobacco: Never Used  Substance and Sexual Activity  . Alcohol use: Not Currently    Comment: social  . Drug use: Yes    Types: Marijuana    Comment: LAST SMOKED ON 12-11-19  . Sexual activity: Yes    Birth control/protection: None, Implant  Other Topics Concern  . Not on file  Social History Narrative  . Not on file   Social Determinants of Health   Financial Resource Strain:   . Difficulty of Paying Living Expenses:   Food Insecurity:   . Worried About Programme researcher, broadcasting/film/video  in the Last Year:   . Ran Out of Food in the Last Year:   Transportation Needs:   . Freight forwarder (Medical):   Marland Kitchen Lack of Transportation (Non-Medical):   Physical Activity:   . Days of Exercise per Week:   . Minutes of Exercise per Session:   Stress:   . Feeling of Stress :   Social Connections:   . Frequency of Communication with Friends and Family:   . Frequency of Social Gatherings with Friends and Family:   . Attends Religious Services:   . Active Member of Clubs or Organizations:   . Attends Banker Meetings:   Marland Kitchen Marital Status:   Intimate Partner Violence:   . Fear of Current or Ex-Partner:   . Emotionally Abused:   Marland Kitchen Physically Abused:   . Sexually Abused:     Allergies:  Allergies    Allergen Reactions  . Trileptal [Oxcarbazepine] Swelling    Per pt, "leg swelling"  . Viibryd [Vilazodone Hcl] Swelling    Per pt "leg swelling"    Medications: Prior to Admission medications   Medication Sig Start Date End Date Taking? Authorizing Provider  TIMOPTIC 0.5 % ophthalmic solution Place 1 drop into the left eye 2 (two) times daily. 03/25/20  Yes [provider]  topiramate (TOPAMAX) 25 MG tablet Take by mouth. 09/18/17  Yes [provider]  VIGAMOX 0.5 % ophthalmic solution Instill 1 drop into right eye every hour  1 drop into Right eye every hour x 6 hours then every 2 hours .  while awake. 03/25/20  Yes [provider]  XALATAN 0.005 % ophthalmic solution Place 1 drop into the left eye daily. 03/25/20  Yes [provider]  lidocaine (XYLOCAINE) 2 % solution Use as directed 5 mLs in the mouth or throat every 6 (six) hours as needed for mouth pain. Oral swish and slow swallow. Patient not taking: Reported on 04/16/2020 01/20/20   Joni Reining, PA-C  meloxicam (MOBIC) 15 MG tablet Take 1 tablet (15 mg total) by mouth daily. Patient not taking: Reported on 04/16/2020 02/11/20   Cuthriell, Delorise Royals, PA-C    Physical Exam Vitals: Blood pressure 128/72, pulse 85, weight 279 lb (126.6 kg) Body mass index is 52.72 kg/m.   General: NAD, obese, appears stated age HEENT: normocephalic, anicteric Pulmonary: No increased work of breathing Neurologic: Grossly intact Psychiatric: mood appropriate, affect full   Assessment: 34 y.o. G2P0020 presenting for follow up of chronic pelvic pain, AUB on Nexplanon  Plan: Problem List Items Addressed This Visit    None    Visit Diagnoses    Abnormal uterine bleeding    -  Primary   Relevant Orders   TSH+Prl+FSH+TestT+LH+DHEA S...   CBC   Hemoglobin A1c   US Transvaginal Non-OB   Screening for diabetes mellitus       Relevant Orders   Hemoglobin A1c   Dyspareunia, female       Pelvic pain          1) Chronic pelvic pain - prior imaging including ultrasound with most recent first trimester miscarriage 12/30/2019 without identifiable etiology.  No recent CT imaging but prior CT abdomen and pelvis for abdominal pain 05/29/2010 normal with note made of prior appendectomy and possible mesenteric adenitis.  Prior laparoscopy 2003 also without identifiable etiology.   She was counseled against hysterectomy by during previous evaluations as her pain does not seem to be solely related to menses.   Treatments have  included Depo Provera, Nexplanon, IUD ( removed secondary to pain same day as placement), Lupron, OCP, as well as a course of pelvic floor physical therapy.  I feel the likelihood of the patient having relief of her pain following hysterectomy is minimal, and the surgical risk given her elevated BMI are significant.  Given that is has been 18 years since her last diagnostic laparoscopy I feel it is reasonable to rule out development of endometriosis in the interim.  Will arrange for diagnostic laparoscopy.  2) Abnormal uterine bleeding - iatrogenic and related to Nexplanon. The patient was worried about possible retained POC, endometrial biopsy obtained 01/18/2020 showed no evidence of chorionic villi, benign proliferative endometrium without evidence of malignancy or hyperplasia.  She was told she had elevated testosterone level and she may well have underlying PCOS. - PCOS panel as well as HgbA1C - CBC - TVUS to evaluate uterine lining, if significantly thinned on nexplanon consider course of po estrogen  3) A total of 20 minutes were spent in face-to-face contact with the patient during this encounter with over half of that time devoted to counseling and coordination of care.  4) Return will be called once preop scheduled.    Malachy Mood, MD, [facility], [facility] 04/16/2020, 8:24 AM

## 2020-04-17 ENCOUNTER — Telehealth: Payer: Self-pay | Admitting: Obstetrics and Gynecology

## 2020-04-17 NOTE — Telephone Encounter (Signed)
[facility]

## 2020-04-17 NOTE — Telephone Encounter (Signed)
-----   Message from Andreas Staebler, MD sent at 04/16/2020  6:03 PM EDT ----- °Regarding: Surgery °Surgery Booking Request °Patient Full Name:  Gina Farmer  °MRN: [id number]  °DOB: 08/26/1986  °Surgeon: Andreas Staebler, MD  °Requested Surgery Date and Time: 2-8 weeks °Primary Diagnosis AND Code: R10.2 pelvic pain °Secondary Diagnosis and Code:  °Surgical Procedure: Diagnostic Laparoscopy °L&D Notification: No °Admission Status: same day surgery °Length of Surgery: 1hr °Special Case Needs: No °H&P: Yes °Phone Interview???:  No °Interpreter: No °Language:  °Medical Clearance:  No °Special Scheduling Instructions: needs TVUS day of H&P order has been placed 04/16/20 °Any known health/anesthesia issues, diabetes, sleep apnea, latex allergy, defibrillator/pacemaker?: No °Acuity: P4 °  (P1 highest, P2 delay may cause harm, P3 low, elective gyn, P4 lowest) ° ° °

## 2020-04-21 LAB — TSH+PRL+FSH+TESTT+LH+DHEA S...
17-Hydroxyprogesterone: 11 ng/dL
Androstenedione: 59 ng/dL (ref 41–262)
DHEA-SO4: 115 ug/dL (ref 84.8–378.0)
FSH: 5.4 m[IU]/mL
LH: 4.9 m[IU]/mL
Prolactin: 11.1 ng/mL (ref 4.8–23.3)
TSH: 3.43 u[IU]/mL (ref 0.450–4.500)
Testosterone, Free: 1.5 pg/mL (ref 0.0–4.2)
Testosterone: 13 ng/dL (ref 8–48)

## 2020-04-21 LAB — CBC
Hematocrit: 41.9 % (ref 34.0–46.6)
Hemoglobin: 13.7 g/dL (ref 11.1–15.9)
MCH: 30.1 pg (ref 26.6–33.0)
MCHC: 32.7 g/dL (ref 31.5–35.7)
MCV: 92 fL (ref 79–97)
Platelets: 278 10*3/uL (ref 150–450)
RBC: 4.55 x10E6/uL (ref 3.77–5.28)
RDW: 12.7 % (ref 11.7–15.4)
WBC: 12.9 10*3/uL — ABNORMAL HIGH (ref 3.4–10.8)

## 2020-04-21 LAB — HEMOGLOBIN A1C
Est. average glucose Bld gHb Est-mCnc: 114 mg/dL
Hgb A1c MFr Bld: 5.6 % (ref 4.8–5.6)

## 2020-04-22 NOTE — Telephone Encounter (Signed)
[facility] 

## 2020-04-23 NOTE — Telephone Encounter (Signed)
-----   Message from Vena Austria, MD sent at 04/16/2020  6:03 PM EDT ----- Regarding: Surgery Surgery Booking Request Patient Full Name:  TSUYAKO JOLLEY  MRN: [id number]  DOB: 1986/11/19  Surgeon: Vena Austria, MD  Requested Surgery Date and Time: 2-8 weeks Primary Diagnosis AND Code: R10.2 pelvic pain Secondary Diagnosis and Code:  Surgical Procedure: Diagnostic Laparoscopy L&D Notification: No Admission Status: same day surgery Length of Surgery: 1hr Special Case Needs: No H&P: Yes Phone Interview???:  No Interpreter: No Language:  Medical Clearance:  No Special Scheduling Instructions: needs TVUS day of H&P order has been placed 04/16/20 Any known health/anesthesia issues, diabetes, sleep apnea, latex allergy, defibrillator/pacemaker?: No Acuity: P4   (P1 highest, P2 delay may cause harm, P3 low, elective gyn, P4 lowest)

## 2020-04-23 NOTE — Telephone Encounter (Signed)
Pt rtnd call to schedule Diag Lap with Stabler  DOS 6/8  H&P and TVUS - 5/24 11:10  Covid 6/4 @ 8-10:30, [address], drive up and wear mask. Adv pt to quar until DOS  Adv pt I will request Pre-admit phone appt and it will be on her MyChart as well as on paperwork recd at H&P. Pt requested an early morning phone call.  Adv pt she may rec calls from hosp pharmacy and pre-svc ctr  Conf pt has [employer] ins.

## 2020-04-23 NOTE — Telephone Encounter (Signed)
-----   Message from Andreas Staebler, MD sent at 04/16/2020  6:03 PM EDT ----- °Regarding: Surgery °Surgery Booking Request °Patient Full Name:  Gina Farmer  °MRN: [id number]  °DOB: 07/07/1986  °Surgeon: Andreas Staebler, MD  °Requested Surgery Date and Time: 2-8 weeks °Primary Diagnosis AND Code: R10.2 pelvic pain °Secondary Diagnosis and Code:  °Surgical Procedure: Diagnostic Laparoscopy °L&D Notification: No °Admission Status: same day surgery °Length of Surgery: 1hr °Special Case Needs: No °H&P: Yes °Phone Interview???:  No °Interpreter: No °Language:  °Medical Clearance:  No °Special Scheduling Instructions: needs TVUS day of H&P order has been placed 04/16/20 °Any known health/anesthesia issues, diabetes, sleep apnea, latex allergy, defibrillator/pacemaker?: No °Acuity: P4 °  (P1 highest, P2 delay may cause harm, P3 low, elective gyn, P4 lowest) ° ° °

## 2020-05-06 ENCOUNTER — Encounter: Payer: 59 | Admitting: Obstetrics and Gynecology

## 2020-05-06 ENCOUNTER — Ambulatory Visit (INDEPENDENT_AMBULATORY_CARE_PROVIDER_SITE_OTHER): Payer: 59

## 2020-05-06 ENCOUNTER — Other Ambulatory Visit: Payer: Self-pay

## 2020-05-06 ENCOUNTER — Encounter: Payer: Self-pay | Admitting: Obstetrics and Gynecology

## 2020-05-06 ENCOUNTER — Ambulatory Visit (INDEPENDENT_AMBULATORY_CARE_PROVIDER_SITE_OTHER): Payer: 59 | Admitting: Obstetrics and Gynecology

## 2020-05-06 VITALS — BP 126/82 | HR 69 | Ht 61.5 in | Wt 274.0 lb

## 2020-05-06 DIAGNOSIS — R102 Pelvic and perineal pain: Secondary | ICD-10-CM

## 2020-05-06 DIAGNOSIS — G8929 Other chronic pain: Secondary | ICD-10-CM | POA: Diagnosis not present

## 2020-05-06 DIAGNOSIS — N939 Abnormal uterine and vaginal bleeding, unspecified: Secondary | ICD-10-CM

## 2020-05-06 DIAGNOSIS — Z01818 Encounter for other preprocedural examination: Secondary | ICD-10-CM

## 2020-05-06 NOTE — Progress Notes (Signed)
Obstetrics & Gynecology Surgery H&P    Chief Complaint: Scheduled Surgery   History of Present Illness: Patient is a 34 y.o. [id number] presenting for scheduled diagnostic laparosocpic, for the treatment or further evaluation of chronic pelvic pain.   Prior Treatments prior to proceeding with surgery include: imgaing, nexplanon  Preoperative Pap: 01/18/2020 Results: ASCUS HPV negative  Preoperative Endometrial biopsy: 01/18/2020 Findings: benign proliferative endometrium Preoperative Ultrasound: 12/30/2019 early IUPat 5 weeks getstation with normal findings in regard to uterus and adnexa   Review of Systems:10 point review of systems  Past Medical History:  Patient Active Problem List   Diagnosis Date Noted  . Trichomoniasis 12/12/2019    Treated in [facility]U 12/12/2019     Past Surgical History:  Past Surgical History:  Procedure Laterality Date  . APPENDECTOMY  2009  . INTRAOCULAR LENS IMPLANT, SECONDARY    . LAPAROSCOPY  2003    Family History:  Family History  Problem Relation Age of Onset  . Hypertension Father   . Heart disease Father   . Narcolepsy Father   . Obesity Father   . Hypertension Brother   . Obesity Maternal Aunt   . Ovarian cancer Maternal Aunt 40  . Obesity Paternal Aunt   . Heart disease Maternal Grandfather   . Ovarian cancer Paternal Grandmother        67s  . Uterine cancer Other     Social History:  Social History   Socioeconomic History  . Marital status: Single    Spouse name: Not on file  . Number of children: Not on file  . Years of education: Not on file  . Highest education level: Not on file  Occupational History  . Not on file  Tobacco Use  . Smoking status: Current Every Day Smoker    Packs/day: 0.50    Types: Cigarettes  . Smokeless tobacco: Never Used  Substance and Sexual Activity  . Alcohol use: Not Currently    Comment: social  . Drug use: Yes    Types: Marijuana    Comment: LAST SMOKED ON 12-11-19  . Sexual activity:  Yes    Birth control/protection: None, Implant  Other Topics Concern  . Not on file  Social History Narrative  . Not on file   Social Determinants of Health   Financial Resource Strain:   . Difficulty of Paying Living Expenses:   Food Insecurity:   . Worried About Programme researcher, broadcasting/film/video in the Last Year:   . Barista in the Last Year:   Transportation Needs:   . Freight forwarder (Medical):   Marland Kitchen Lack of Transportation (Non-Medical):   Physical Activity:   . Days of Exercise per Week:   . Minutes of Exercise per Session:   Stress:   . Feeling of Stress :   Social Connections:   . Frequency of Communication with Friends and Family:   . Frequency of Social Gatherings with Friends and Family:   . Attends Religious Services:   . Active Member of Clubs or Organizations:   . Attends Banker Meetings:   Marland Kitchen Marital Status:   Intimate Partner Violence:   . Fear of Current or Ex-Partner:   . Emotionally Abused:   Marland Kitchen Physically Abused:   . Sexually Abused:     Allergies:  Allergies  Allergen Reactions  . Trileptal [Oxcarbazepine] Swelling    Per pt, "leg swelling"  . Viibryd [Vilazodone Hcl] Swelling    Per pt "leg swelling"  Medications: Prior to Admission medications   Medication Sig Start Date End Date Taking? Authorizing Provider  lidocaine (XYLOCAINE) 2 % solution Use as directed 5 mLs in the mouth or throat every 6 (six) hours as needed for mouth pain. Oral swish and slow swallow. Patient not taking: Reported on 04/16/2020 01/20/20   Joni Reining, PA-C  meloxicam (MOBIC) 15 MG tablet Take 1 tablet (15 mg total) by mouth daily. 04/16/20   Vena Austria, MD  TIMOPTIC 0.5 % ophthalmic solution Place 1 drop into the left eye 2 (two) times daily. 03/25/20   [provider]  topiramate (TOPAMAX) 25 MG tablet Take by mouth. 09/18/17   [provider]  VIGAMOX 0.5 % ophthalmic solution Instill 1 drop into right eye every hour  1 drop into  Right eye every hour x 6 hours then every 2 hours .  while awake. 03/25/20   [provider]  XALATAN 0.005 % ophthalmic solution Place 1 drop into the left eye daily. 03/25/20   [provider]    Physical Exam Vitals: unknown if currently breastfeeding. General: NAD HEENT: normocephalic, anicteric Pulmonary: No increased work of breathing, CTAB Cardiovascular: RRR, distal pulses 2+ Abdomen: soft, non-tender, non-distended Genitourinary: deferred Extremities: no edema, erythema, or tenderness Neurologic: Grossly intact Psychiatric: mood appropriate, affect full  Imaging US Transvaginal Non-OB  Result Date: 05/06/2020 Patient Name: NERY KALISZ DOB: 12/02/1986 MRN: [id number] ULTRASOUND REPORT Location: [facility] Date of Service: 05/06/2020 Indications:Abnormal Uterine Bleeding Findings: The uterus is anteverted and measures 7.5 x 4.1 x 3.1 cm. Echo texture is homogenous without evidence of focal masses. The Endometrium measures 2.9 mm. Right Ovary measures 3.3 x 2.1 x 2.0 cm. It is normal in appearance. Left Ovary measures 3.2 x 2.2 x 1.8 cm. It is normal in appearance. Survey of the adnexa demonstrates no adnexal masses. There is no free fluid in the cul de sac. Impression: 1. Normal pelvic ultrasound. Recommendations: 1.Clinical correlation with the patient's History and Physical Exam. Deanna Artis, RT Images reviewed.  Normal GYN study without visualized pathology.  Vena Austria, MD, [facility], [facility] 05/06/2020, 2:28 PM    Assessment: 35 y.o. G2P0020 presenting for scheduled diagnostic laparoscopy   Plan: 1) I have had a careful discussion with this patient about all the options available and the risk/benefits of each. I have fully informed this patient that a laparoscopy may subject her to a variety of discomforts and risks: She understands that most patients have surgery with little difficulty, but problems can happen  ranging from minor to fatal. These include nausea, vomiting, pain, bleeding, infection, poor healing, hernia, or formation of adhesions. Unexpected reactions may occur from any drug or anesthetic given. Unintended injury may occur to other pelvic or abdominal structures such as Fallopian tubes, ovaries, bladder, ureter (tube from kidney to bladder), or bowel. Nerves going from the pelvis to the legs may be injured. Any such injury may require immediate or later additional surgery to correct the problem. Excessive blood loss requiring transfusion is very unlikely but possible. Dangerous blood clots may form in the legs or lungs. Physical and sexual activity will be restricted in varying degrees for an indeterminate period of time but most often 2-4 weeks. She understands that the plan is to do this laparoscopically, however, there is a chance that this will need to be performed via a larger incision. Finally, she understands that it is impossible to list every possible undesirable effect and that the  condition for which surgery is done is not always cured or significantly improved, and in rare cases may be even worsen. Ample time was given to answer all questions.   2) Routine postoperative instructions were reviewed with the patient and her family in detail today including the expected length of recovery and likely postoperative course.  The patient concurred with the proposed plan, giving informed written consent for the surgery today.  Patient instructed on the importance of being NPO after midnight prior to her procedure.  If warranted preoperative prophylactic antibiotics and SCDs ordered on call to the OR to meet SCIP guidelines and adhere to recommendation laid forth in [place] Number 104 May 2009  "Antibiotic Prophylaxis for Gynecologic Procedures".     Malachy Mood, MD, [facility], [facility] 05/06/2020, 10:54 AM

## 2020-05-14 ENCOUNTER — Encounter
Admission: RE | Admit: 2020-05-14 | Discharge: 2020-05-14 | Disposition: A | Discharge status: Home / Self Care | Payer: 59 | Source: Ambulatory Visit | Attending: Obstetrics and Gynecology | Admitting: Obstetrics and Gynecology

## 2020-05-14 ENCOUNTER — Other Ambulatory Visit: Payer: Self-pay

## 2020-05-14 DIAGNOSIS — Z01812 Encounter for preprocedural laboratory examination: Secondary | ICD-10-CM | POA: Insufficient documentation

## 2020-05-14 HISTORY — DX: Gastro-esophageal reflux disease without esophagitis: K21.9

## 2020-05-14 HISTORY — DX: Ocular hypertension, left eye: H40.052

## 2020-05-14 NOTE — Patient Instructions (Signed)
Your procedure is scheduled on: Tues 6/8 Report to Day Surgery. To find out your arrival time please call [phone] between 1PM - 3PM on Mon. 6/7.  Remember: Instructions that are not followed completely may result in serious medical risk,  up to and including death, or upon the discretion of your surgeon and anesthesiologist your  surgery may need to be rescheduled.     _X__ 1. Do not eat food after midnight the night before your procedure.                 No gum chewing or hard candies. You may drink clear liquids up to 2 hours                 before you are scheduled to arrive for your surgery- DO not drink clear                 liquids within 2 hours of the start of your surgery.                 Clear Liquids include:  water, apple juice without pulp, clear Gatorade, G2 or                  Gatorade Zero (avoid Red/Purple/Blue), Black Coffee or Tea (Do not add                 anything to coffee or tea). _____2.   Complete the carbohydrate drink provided to you, 2 hours before arrival.  __X__2.  On the morning of surgery brush your teeth with toothpaste and water, you                may rinse your mouth with mouthwash if you wish.  Do not swallow any toothpaste of mouthwash.     _X__ 3.  No Alcohol for 24 hours before or after surgery.   _X__ 4.  Do Not Smoke or use e-cigarettes For 24 Hours Prior to Your Surgery.                 Do not use any chewable tobacco products for at least 6 hours prior to                 Surgery.  _X__  5.  Do not use any recreational drugs (marijuana, cocaine, heroin, ecstacy, MDMA or other)                For at least one week prior to your surgery.  Combination of these drugs with anesthesia                May have life threatening results.  ____  6.  Bring all medications with you on the day of surgery if instructed.   __x__  7.  Notify your doctor if there is any change in your medical condition      (cold, fever,  infections).     Do not wear jewelry, make-up, hairpins, clips or nail polish. Do not wear lotions, powders, or perfumes. You may wear deodorant. Do not shave 48 hours prior to surgery. Do not bring valuables to the hospital.    [facility] is not responsible for any belongings or valuables.  Contacts, dentures or bridgework may not be worn into surgery. Leave your suitcase in the car. After surgery it may be brought to your room. For patients admitted to the hospital, discharge time is determined by your treatment team.   Patients discharged the  day of surgery will not be allowed to drive home.   Make arrangements for someone to be with you for the first 24 hours of your Same Day Discharge.    Please read over the following fact sheets that you were given:    __x__ Take these medicines the morning of surgery with A SIP OF WATER:    1. hydrOXYzine (VISTARIL) 25 MG capsule if needed  2. TIMOPTIC 0.5 % ophthalmic solution  3.   4.  5.  6.  ____ Fleet Enema (as directed)   __x__ Use CHG Soap (or wipes) as directed  ____ Use Benzoyl Peroxide Gel as instructed  ____ Use inhalers on the day of surgery  ____ Stop metformin 2 days prior to surgery    ____ Take 1/2 of usual insulin dose the night before surgery. No insulin the morning          of surgery.   ____ Stop Coumadin/Plavix/aspirin on   __x__ Stop Anti-inflammatories ibuprofen (ADVIL) 200 MG tablet, meloxicam (MOBIC) 15 MG tabletor Aleve.  May take tylenol   ____ Stop supplements until after surgery.    ____ Bring C-Pap to the hospital.

## 2020-05-17 ENCOUNTER — Other Ambulatory Visit
Admission: RE | Admit: 2020-05-17 | Discharge: 2020-05-17 | Disposition: A | Discharge status: Home / Self Care | Payer: 59 | Source: Ambulatory Visit | Attending: Obstetrics and Gynecology | Admitting: Obstetrics and Gynecology

## 2020-05-17 ENCOUNTER — Other Ambulatory Visit: Payer: Self-pay

## 2020-05-17 DIAGNOSIS — Z20822 Contact with and (suspected) exposure to covid-19: Secondary | ICD-10-CM | POA: Insufficient documentation

## 2020-05-17 DIAGNOSIS — Z01812 Encounter for preprocedural laboratory examination: Secondary | ICD-10-CM | POA: Diagnosis present

## 2020-05-17 LAB — SARS CORONAVIRUS 2 (TAT 6-24 HRS): SARS Coronavirus 2: NEGATIVE

## 2020-05-21 ENCOUNTER — Ambulatory Visit: Payer: 59 | Admitting: Certified Registered Nurse Anesthetist

## 2020-05-21 ENCOUNTER — Encounter: Payer: Self-pay | Admitting: Obstetrics and Gynecology

## 2020-05-21 ENCOUNTER — Other Ambulatory Visit: Payer: Self-pay

## 2020-05-21 ENCOUNTER — Ambulatory Visit
Admission: RE | Admit: 2020-05-21 | Discharge: 2020-05-21 | Disposition: A | Discharge status: Home / Self Care | Payer: 59 | Attending: Obstetrics and Gynecology | Admitting: Obstetrics and Gynecology

## 2020-05-21 ENCOUNTER — Encounter
Admission: RE | Disposition: A | Discharge status: Home / Self Care | Payer: Self-pay | Source: Home / Self Care | Attending: Obstetrics and Gynecology

## 2020-05-21 DIAGNOSIS — F17210 Nicotine dependence, cigarettes, uncomplicated: Secondary | ICD-10-CM | POA: Diagnosis not present

## 2020-05-21 DIAGNOSIS — Z79899 Other long term (current) drug therapy: Secondary | ICD-10-CM | POA: Insufficient documentation

## 2020-05-21 DIAGNOSIS — Z791 Long term (current) use of non-steroidal anti-inflammatories (NSAID): Secondary | ICD-10-CM | POA: Diagnosis not present

## 2020-05-21 DIAGNOSIS — G8929 Other chronic pain: Secondary | ICD-10-CM | POA: Diagnosis not present

## 2020-05-21 DIAGNOSIS — Z6841 Body Mass Index (BMI) 40.0 and over, adult: Secondary | ICD-10-CM | POA: Insufficient documentation

## 2020-05-21 DIAGNOSIS — N809 Endometriosis, unspecified: Secondary | ICD-10-CM | POA: Insufficient documentation

## 2020-05-21 DIAGNOSIS — Z9889 Other specified postprocedural states: Secondary | ICD-10-CM

## 2020-05-21 DIAGNOSIS — A599 Trichomoniasis, unspecified: Secondary | ICD-10-CM

## 2020-05-21 DIAGNOSIS — R102 Pelvic and perineal pain: Secondary | ICD-10-CM

## 2020-05-21 HISTORY — PX: LAPAROSCOPY: SHX197

## 2020-05-21 LAB — URINE DRUG SCREEN, QUALITATIVE (ARMC ONLY)
Amphetamines, Ur Screen: NOT DETECTED
Barbiturates, Ur Screen: NOT DETECTED
Benzodiazepine, Ur Scrn: NOT DETECTED
Cannabinoid 50 Ng, Ur ~~LOC~~: NOT DETECTED
Cocaine Metabolite,Ur ~~LOC~~: NOT DETECTED
MDMA (Ecstasy)Ur Screen: NOT DETECTED
Methadone Scn, Ur: NOT DETECTED
Opiate, Ur Screen: NOT DETECTED
Phencyclidine (PCP) Ur S: NOT DETECTED
Tricyclic, Ur Screen: NOT DETECTED

## 2020-05-21 SURGERY — LAPAROSCOPY, DIAGNOSTIC
Anesthesia: General

## 2020-05-21 MED ORDER — HYDROCODONE-ACETAMINOPHEN 5-325 MG PO TABS: 1.0000 | ORAL_TABLET | ORAL | 0 refills | Status: DC | PRN

## 2020-05-21 MED ORDER — MIDAZOLAM HCL 2 MG/2ML IJ SOLN
INTRAMUSCULAR | Status: DC | PRN
  Administered 2020-05-21: 2 mg via INTRAVENOUS

## 2020-05-21 MED ORDER — OXYCODONE HCL 5 MG/5ML PO SOLN: 5.0000 mg | Freq: Once | ORAL | Status: AC | PRN

## 2020-05-21 MED ORDER — PHENYLEPHRINE HCL (PRESSORS) 10 MG/ML IV SOLN
INTRAVENOUS | Status: DC | PRN
  Administered 2020-05-21 (×2): 100 ug via INTRAVENOUS

## 2020-05-21 MED ORDER — CHLORHEXIDINE GLUCONATE 0.12 % MT SOLN: 15.0000 mL | Freq: Once | OROMUCOSAL | Status: AC

## 2020-05-21 MED ORDER — FENTANYL CITRATE (PF) 100 MCG/2ML IJ SOLN
INTRAMUSCULAR | Status: DC | PRN
  Administered 2020-05-21 (×2): 50 ug via INTRAVENOUS

## 2020-05-21 MED ORDER — ACETAMINOPHEN 10 MG/ML IV SOLN
INTRAVENOUS | Status: DC | PRN
  Administered 2020-05-21: 1000 mg via INTRAVENOUS

## 2020-05-21 MED ORDER — BUPIVACAINE HCL (PF) 0.5 % IJ SOLN
INTRAMUSCULAR | Status: AC
  Filled 2020-05-21: qty 30

## 2020-05-21 MED ORDER — SUGAMMADEX SODIUM 500 MG/5ML IV SOLN
INTRAVENOUS | Status: DC | PRN
  Administered 2020-05-21: 500 mg via INTRAVENOUS

## 2020-05-21 MED ORDER — DEXAMETHASONE SODIUM PHOSPHATE 10 MG/ML IJ SOLN
INTRAMUSCULAR | Status: AC
  Filled 2020-05-21: qty 1

## 2020-05-21 MED ORDER — SUCCINYLCHOLINE CHLORIDE 200 MG/10ML IV SOSY
PREFILLED_SYRINGE | INTRAVENOUS | Status: AC
  Filled 2020-05-21: qty 10

## 2020-05-21 MED ORDER — SUGAMMADEX SODIUM 500 MG/5ML IV SOLN
INTRAVENOUS | Status: AC
  Filled 2020-05-21: qty 5

## 2020-05-21 MED ORDER — PROPOFOL 10 MG/ML IV BOLUS
INTRAVENOUS | Status: DC | PRN
  Administered 2020-05-21: 200 mg via INTRAVENOUS

## 2020-05-21 MED ORDER — PROPOFOL 10 MG/ML IV BOLUS
INTRAVENOUS | Status: AC
  Filled 2020-05-21: qty 20

## 2020-05-21 MED ORDER — FAMOTIDINE 20 MG PO TABS: 20.0000 mg | ORAL_TABLET | Freq: Once | ORAL | Status: AC

## 2020-05-21 MED ORDER — ACETAMINOPHEN 10 MG/ML IV SOLN
INTRAVENOUS | Status: AC
  Filled 2020-05-21: qty 100

## 2020-05-21 MED ORDER — ROCURONIUM BROMIDE 10 MG/ML (PF) SYRINGE
PREFILLED_SYRINGE | INTRAVENOUS | Status: AC
  Filled 2020-05-21: qty 10

## 2020-05-21 MED ORDER — DEXAMETHASONE SODIUM PHOSPHATE 10 MG/ML IJ SOLN
INTRAMUSCULAR | Status: DC | PRN
  Administered 2020-05-21: 10 mg via INTRAVENOUS

## 2020-05-21 MED ORDER — BUPIVACAINE HCL 0.5 % IJ SOLN
INTRAMUSCULAR | Status: DC | PRN
  Administered 2020-05-21: 8 mL

## 2020-05-21 MED ORDER — ORAL CARE MOUTH RINSE: 15.0000 mL | Freq: Once | OROMUCOSAL | Status: AC

## 2020-05-21 MED ORDER — DEXMEDETOMIDINE HCL 200 MCG/2ML IV SOLN
INTRAVENOUS | Status: DC | PRN
  Administered 2020-05-21: 8 ug via INTRAVENOUS

## 2020-05-21 MED ORDER — CHLORHEXIDINE GLUCONATE 0.12 % MT SOLN
OROMUCOSAL | Status: AC
  Administered 2020-05-21: 15 mL via OROMUCOSAL
  Filled 2020-05-21: qty 15

## 2020-05-21 MED ORDER — OXYCODONE HCL 5 MG PO TABS: 5.0000 mg | ORAL_TABLET | Freq: Once | ORAL | Status: AC | PRN

## 2020-05-21 MED ORDER — FAMOTIDINE 20 MG PO TABS
ORAL_TABLET | ORAL | Status: AC
  Administered 2020-05-21: 20 mg via ORAL
  Filled 2020-05-21: qty 1

## 2020-05-21 MED ORDER — MEPERIDINE HCL 50 MG/ML IJ SOLN: 6.2500 mg | INTRAMUSCULAR | Status: DC | PRN

## 2020-05-21 MED ORDER — FENTANYL CITRATE (PF) 100 MCG/2ML IJ SOLN
25.0000 ug | INTRAMUSCULAR | Status: DC | PRN
  Administered 2020-05-21 (×3): 25 ug via INTRAVENOUS

## 2020-05-21 MED ORDER — OXYCODONE HCL 5 MG PO TABS
ORAL_TABLET | ORAL | Status: AC
  Administered 2020-05-21: 5 mg via ORAL
  Filled 2020-05-21: qty 1

## 2020-05-21 MED ORDER — MIDAZOLAM HCL 2 MG/2ML IJ SOLN
INTRAMUSCULAR | Status: AC
  Filled 2020-05-21: qty 2

## 2020-05-21 MED ORDER — FENTANYL CITRATE (PF) 100 MCG/2ML IJ SOLN
INTRAMUSCULAR | Status: AC
  Administered 2020-05-21: 25 ug via INTRAVENOUS
  Filled 2020-05-21: qty 2

## 2020-05-21 MED ORDER — ONDANSETRON HCL 4 MG/2ML IJ SOLN
INTRAMUSCULAR | Status: DC | PRN
  Administered 2020-05-21: 4 mg via INTRAVENOUS

## 2020-05-21 MED ORDER — IBUPROFEN 200 MG PO TABS: 800.0000 mg | ORAL_TABLET | Freq: Four times a day (QID) | ORAL | 0 refills | Status: DC | PRN

## 2020-05-21 MED ORDER — ROCURONIUM BROMIDE 100 MG/10ML IV SOLN
INTRAVENOUS | Status: DC | PRN
  Administered 2020-05-21: 50 mg via INTRAVENOUS
  Administered 2020-05-21: 10 mg via INTRAVENOUS

## 2020-05-21 MED ORDER — LIDOCAINE HCL (CARDIAC) PF 100 MG/5ML IV SOSY
PREFILLED_SYRINGE | INTRAVENOUS | Status: DC | PRN
  Administered 2020-05-21: 100 mg via INTRAVENOUS

## 2020-05-21 MED ORDER — FENTANYL CITRATE (PF) 100 MCG/2ML IJ SOLN
INTRAMUSCULAR | Status: AC
  Filled 2020-05-21: qty 2

## 2020-05-21 MED ORDER — ONDANSETRON HCL 4 MG/2ML IJ SOLN
INTRAMUSCULAR | Status: AC
  Filled 2020-05-21: qty 2

## 2020-05-21 MED ORDER — LACTATED RINGERS IV SOLN: INTRAVENOUS | Status: DC

## 2020-05-21 MED ORDER — PROMETHAZINE HCL 25 MG/ML IJ SOLN: 6.2500 mg | INTRAMUSCULAR | Status: DC | PRN

## 2020-05-21 SURGICAL SUPPLY — 34 items
BLADE SURG SZ11 CARB STEEL (BLADE) ×3 IMPLANT
CANISTER SUCT 1200ML W/VALVE (MISCELLANEOUS) ×3 IMPLANT
CATH ROBINSON RED A/P 16FR (CATHETERS) ×3 IMPLANT
CHLORAPREP W/TINT 26 (MISCELLANEOUS) ×6 IMPLANT
COVER WAND RF STERILE (DRAPES) ×6 IMPLANT
DERMABOND ADVANCED (GAUZE/BANDAGES/DRESSINGS) ×2
DERMABOND ADVANCED .7 DNX12 (GAUZE/BANDAGES/DRESSINGS) ×1 IMPLANT
DRSG TEGADERM 2-3/8X2-3/4 SM (GAUZE/BANDAGES/DRESSINGS) IMPLANT
GLOVE BIO SURGEON STRL SZ7 (GLOVE) ×6 IMPLANT
GLOVE INDICATOR 7.5 STRL GRN (GLOVE) ×6 IMPLANT
GOWN STRL REUS W/ TWL LRG LVL3 (GOWN DISPOSABLE) ×2 IMPLANT
GOWN STRL REUS W/TWL LRG LVL3 (GOWN DISPOSABLE) ×4
IRRIGATION STRYKERFLOW (MISCELLANEOUS) IMPLANT
IRRIGATOR STRYKERFLOW (MISCELLANEOUS)
IV LACTATED RINGERS 1000ML (IV SOLUTION) IMPLANT
KIT PINK PAD W/HEAD ARE REST (MISCELLANEOUS) ×3
KIT PINK PAD W/HEAD ARM REST (MISCELLANEOUS) ×1 IMPLANT
KIT TURNOVER CYSTO (KITS) ×3 IMPLANT
LABEL OR SOLS (LABEL) ×3 IMPLANT
NS IRRIG 500ML POUR BTL (IV SOLUTION) ×3 IMPLANT
PACK GYN LAPAROSCOPIC (MISCELLANEOUS) ×3 IMPLANT
PAD OB MATERNITY 4.3X12.25 (PERSONAL CARE ITEMS) ×3 IMPLANT
PAD PREP 24X41 OB/GYN DISP (PERSONAL CARE ITEMS) ×3 IMPLANT
SCISSORS METZENBAUM CVD 33 (INSTRUMENTS) IMPLANT
SET TUBE SMOKE EVAC HIGH FLOW (TUBING) IMPLANT
SHEARS HARMONIC ACE PLUS 36CM (ENDOMECHANICALS) IMPLANT
SLEEVE ENDOPATH XCEL 5M (ENDOMECHANICALS) ×3 IMPLANT
SPONGE GAUZE 2X2 8PLY STER LF (GAUZE/BANDAGES/DRESSINGS)
SPONGE GAUZE 2X2 8PLY STRL LF (GAUZE/BANDAGES/DRESSINGS) IMPLANT
SUT MNCRL AB 4-0 PS2 18 (SUTURE) ×3 IMPLANT
SUT VIC AB 0 CT2 27 (SUTURE) IMPLANT
SYR 10ML LL (SYRINGE) ×3 IMPLANT
TROCAR ENDO BLADELESS 11MM (ENDOMECHANICALS) IMPLANT
TROCAR XCEL NON-BLD 5MMX100MML (ENDOMECHANICALS) ×3 IMPLANT

## 2020-05-21 NOTE — Discharge Instructions (Signed)

## 2020-05-21 NOTE — H&P (Signed)
Date of Initial H&P: 05/06/2020  History reviewed, patient examined, no change in status, stable for surgery.

## 2020-05-21 NOTE — Anesthesia Preprocedure Evaluation (Signed)
Anesthesia Evaluation  Patient identified by MRN, date of birth, ID band Patient awake    Reviewed: Allergy & Precautions, NPO status , Patient's Chart, lab work & pertinent test results  History of Anesthesia Complications Negative for: history of anesthetic complications  Airway Mallampati: III  TM Distance: >3 FB Neck ROM: Full    Dental  (+) Poor Dentition   Pulmonary neg sleep apnea, neg COPD, Current SmokerPatient did not abstain from smoking.,    breath sounds clear to auscultation- rhonchi (-) wheezing      Cardiovascular Exercise Tolerance: Good (-) hypertension(-) CAD, (-) Past MI, (-) Cardiac Stents and (-) CABG  Rhythm:Regular Rate:Normal - Systolic murmurs and - Diastolic murmurs    Neuro/Psych neg Seizures PSYCHIATRIC DISORDERS Anxiety Depression Bipolar Disorder negative neurological ROS     GI/Hepatic Neg liver ROS, GERD  ,  Endo/Other  negative endocrine ROSneg diabetes  Renal/GU negative Renal ROS     Musculoskeletal negative musculoskeletal ROS (+)   Abdominal (+) + obese,   Peds  Hematology negative hematology ROS (+)   Anesthesia Other Findings Past Medical History: No date: Anxiety No date: Back injury No date: Bipolar 1 disorder (HCC) No date: Bipolar 2 disorder (HCC) No date: Depression No date: Family history of ovarian cancer     Comment:  2/21 cancer genetic testing letter sent No date: GERD (gastroesophageal reflux disease) No date: Increased pressure in the eye, left No date: Morbid obesity (HCC) No date: Post traumatic stress disorder (PTSD) No date: PTSD (post-traumatic stress disorder)   Reproductive/Obstetrics                             Anesthesia Physical Anesthesia Plan  ASA: II  Anesthesia Plan: General   Post-op Pain Management:    Induction: Intravenous  PONV Risk Score and Plan: 1 and Ondansetron and Midazolam  Airway Management  Planned: Oral ETT  Additional Equipment:   Intra-op Plan:   Post-operative Plan: Extubation in OR  Informed Consent: I have reviewed the patients History and Physical, chart, labs and discussed the procedure including the risks, benefits and alternatives for the proposed anesthesia with the patient or authorized representative who has indicated his/her understanding and acceptance.     Dental advisory given  Plan Discussed with: CRNA and Anesthesiologist  Anesthesia Plan Comments:         Anesthesia Quick Evaluation

## 2020-05-21 NOTE — Transfer of Care (Signed)
Immediate Anesthesia Transfer of Care Note  Patient: Gina Farmer  Procedure(s) Performed: LAPAROSCOPY DIAGNOSTIC (N/A )  Patient Location: PACU  Anesthesia Type:General  Level of Consciousness: awake, alert  and oriented  Airway & Oxygen Therapy: Patient Spontanous Breathing and Patient connected to face mask oxygen  Post-op Assessment: Report given to RN and Post -op Vital signs reviewed and stable  Post vital signs: Reviewed and stable  Last Vitals:  Vitals Value Taken Time  BP    Temp    Pulse    Resp    SpO2      Last Pain:  Vitals:   05/21/20 1057  TempSrc: Tympanic         Complications: No apparent anesthesia complications

## 2020-05-21 NOTE — Op Note (Addendum)
Preoperative Diagnosis: 1) 34 y.o. with chronic pelvic pain  Postoperative Diagnosis: 1) 34 y.o. with chronic pelvic pain 2) Stage I endometriosis   Operation Performed: Diagnostic laparoscopy  Indication: 34 y.o. G2P0020  with chronic pelvic pain, normal diagnostic laparoscopy 2003, normal ultrasound imaging  Surgeon: Vena Austria, MD  Anesthesia: General  Preoperative Antibiotics: none  Estimated Blood Loss: 1 mL  IV Fluids:  Urine Output:: 22mL  Drains or Tubes: none  Implants: none  Specimens Removed: none  Complications: none  Intraoperative Findings: Normal tubes, ovaries, uterus, and cervix.  Normal liver edge absent gallbladder.  Small omental adhesions noted in left upper quadrant.  There were small bilateral endometriotic implants bilateral uterosacral ligaments.  The ovarian fossas were clear bilaterally.  The cul de sac was clear of endometriosis but did show evidence of a small peritoneal window.    Patient Condition: stable  Procedure in Detail:  Patient was taken to the operating room where she was administered general anesthesia.  She was positioned in the dorsal lithotomy position utilizing Allen stirups, prepped and draped in the usual sterile fashion.  Prior to proceeding with procedure a time out was performed.  Attention was turned to the patient's pelvis.  A red rubber catheter was used to empty the patient's bladder.  An operative speculum was placed to allow visualization of the cervix.  The anterior lip of the cervix was grasped with a single tooth tenaculum, and a Hulka tenaculum was placed to allow manipulation of the uterus.  The operative speculum and single tooth tenaculum were then removed.  Attention was turned to the patient's abdomen.  The umbilicus was infiltrated with 1% Sensorcaine, before making a stab incision using an 11 blade scalpel.  A 99mm Excel trocar was then used to gain direct entry into the peritoneal cavity utilizing the  camera to visualize progress of the trocar during placement.  Once peritoneal entry had been achieved, insufflation was started and pneumoperitoneum established at a pressure of . Oneadditional 1mm excel trocars were placed under direct visualiztion in the in the left lower quadrant.General inspection of the abdomen revealed the above noted findings.   Pneumoperitoneum was evacuated.  The trocars were removed.  All trocar sites were then dressed with surgical skin glue.  The Hulka tenaculum was removed.  Sponge needle and instrument counts were correct time two.  The patient tolerated the procedure well and was taken to the recovery room in stable condition.

## 2020-05-21 NOTE — Anesthesia Postprocedure Evaluation (Signed)
Anesthesia Post Note  Patient: Gina Farmer  Procedure(s) Performed: LAPAROSCOPY DIAGNOSTIC (N/A )  Patient location during evaluation: PACU Anesthesia Type: General Level of consciousness: awake and alert and oriented Pain management: pain level controlled Vital Signs Assessment: post-procedure vital signs reviewed and stable Respiratory status: spontaneous breathing, nonlabored ventilation and respiratory function stable Cardiovascular status: blood pressure returned to baseline and stable Postop Assessment: no signs of nausea or vomiting Anesthetic complications: no     Last Vitals:  Vitals:   05/21/20 1320 05/21/20 1327  BP:  135/89  Pulse: 66 65  Resp: 10 19  Temp:    SpO2: 93% 94%    Last Pain:  Vitals:   05/21/20 1337  TempSrc:   PainSc: 3                  Nicoles Sedlacek

## 2020-05-21 NOTE — Anesthesia Procedure Notes (Signed)
Procedure Name: Intubation Date/Time: 05/21/2020 11:53 AM Performed by: Rosanne Gutting, CRNA Pre-anesthesia Checklist: Patient identified, Patient being monitored, Timeout performed, Emergency Drugs available and Suction available Patient Re-evaluated:Patient Re-evaluated prior to induction Oxygen Delivery Method: Circle system utilized Preoxygenation: Pre-oxygenation with 100% oxygen Induction Type: IV induction Ventilation: Two handed mask ventilation required and Oral airway inserted - appropriate to patient size Laryngoscope Size: 3 and McGraph Grade View: Grade I Tube type: Oral Tube size: 7.0 mm Number of attempts: 1 Airway Equipment and Method: Stylet and Video-laryngoscopy Placement Confirmation: ETT inserted through vocal cords under direct vision,  positive ETCO2 and breath sounds checked- equal and bilateral Secured at: 21 cm Tube secured with: Tape Dental Injury: Teeth and Oropharynx as per pre-operative assessment

## 2020-05-23 ENCOUNTER — Encounter: Payer: Self-pay | Admitting: Obstetrics and Gynecology

## 2020-05-31 ENCOUNTER — Other Ambulatory Visit: Payer: Self-pay

## 2020-05-31 ENCOUNTER — Ambulatory Visit (INDEPENDENT_AMBULATORY_CARE_PROVIDER_SITE_OTHER): Payer: 59 | Admitting: Obstetrics and Gynecology

## 2020-05-31 ENCOUNTER — Encounter: Payer: Self-pay | Admitting: Obstetrics and Gynecology

## 2020-05-31 VITALS — BP 132/74 | Ht 61.5 in | Wt 272.0 lb

## 2020-05-31 DIAGNOSIS — N809 Endometriosis, unspecified: Secondary | ICD-10-CM | POA: Insufficient documentation

## 2020-05-31 DIAGNOSIS — Z4889 Encounter for other specified surgical aftercare: Secondary | ICD-10-CM

## 2020-05-31 MED ORDER — ORILISSA 150 MG PO TABS: 1.0000 | ORAL_TABLET | Freq: Every day | ORAL | 5 refills | Status: DC

## 2020-05-31 NOTE — Progress Notes (Signed)
      Postoperative Follow-up Patient presents post op from diagnostic laparoscopy 1weeks ago for pelvic pain.  Subjective: Patient reports marked improvement in her preop symptoms. Eating a regular diet without difficulty. Pain is controlled without any medications.  Activity: normal activities of daily living.  Objective: Blood pressure 132/74, height 5' 1.5" (1.562 m), weight 272 lb (123.4 kg), unknown if currently breastfeeding.  General: NAD Pulmonary: no increased work of breathing Abdomen: soft, non-tender, non-distended, incision(s) D/C/I Extremities: no edema Neurologic: normal gait    Admission on 05/21/2020, Discharged on 05/21/2020  Component Date Value Ref Range Status  . Tricyclic, Ur Screen 05/21/2020 NONE DETECTED  NONE DETECTED Final  . Amphetamines, Ur Screen 05/21/2020 NONE DETECTED  NONE DETECTED Final  . MDMA (Ecstasy)Ur Screen 05/21/2020 NONE DETECTED  NONE DETECTED Final  . Cocaine Metabolite,Ur [place] 05/21/2020 NONE DETECTED  NONE DETECTED Final  . Opiate, Ur Screen 05/21/2020 NONE DETECTED  NONE DETECTED Final  . Phencyclidine (PCP) Ur S 05/21/2020 NONE DETECTED  NONE DETECTED Final  . Cannabinoid 50 Ng, Ur [place] 05/21/2020 NONE DETECTED  NONE DETECTED Final  . Barbiturates, Ur Screen 05/21/2020 NONE DETECTED  NONE DETECTED Final  . Benzodiazepine, Ur Scrn 05/21/2020 NONE DETECTED  NONE DETECTED Final  . Methadone Scn, Ur 05/21/2020 NONE DETECTED  NONE DETECTED Final   Comment: (NOTE) Tricyclics + metabolites, urine    Cutoff 1000 ng/mL Amphetamines + metabolites, urine  Cutoff 1000 ng/mL MDMA (Ecstasy), urine              Cutoff 500 ng/mL Cocaine Metabolite, urine          Cutoff 300 ng/mL Opiate + metabolites, urine        Cutoff 300 ng/mL Phencyclidine (PCP), urine         Cutoff 25 ng/mL Cannabinoid, urine                 Cutoff 50 ng/mL Barbiturates + metabolites, urine  Cutoff 200 ng/mL Benzodiazepine, urine              Cutoff 200 ng/mL Methadone,  urine                   Cutoff 300 ng/mL The urine drug screen provides only a preliminary, unconfirmed analytical test result and should not be used for non-medical purposes. Clinical consideration and professional judgment should be applied to any positive drug screen result due to possible interfering substances. A more specific alternate chemical method must be used in order to obtain a confirmed analytical result. Gas chromatography / mass spectrometry (GC/MS) is the preferred confirmat                          ory method. Performed at [facility], [address], [place], [place] [address]     Assessment: 34 y.o. s/p diagnostic laparoscopy stable  Plan: Patient has done well after surgery with no apparent complications.  I have discussed the post-operative course to date, and the expected progress moving forward.  The patient understands what complications to be concerned about.  I will see the patient in routine follow up, or sooner if needed.    Activity plan: No restriction.  Rx Dewayne Hatch discussed potential of oophorectomy if good response to Gildardo Griffes, MD, [facility], [facility] 05/31/2020, 8:36 AM

## 2020-05-31 NOTE — Telephone Encounter (Signed)
Prior authorization Gina Farmer

## 2020-06-03 ENCOUNTER — Telehealth: Payer: Self-pay

## 2020-06-03 NOTE — Telephone Encounter (Signed)
Pt calling; needs to talk to AMS or his nurse regarding getting a different rx.  [phone]

## 2020-06-04 NOTE — Telephone Encounter (Signed)
Left detailed msg.

## 2020-06-04 NOTE — Telephone Encounter (Signed)
Will you call pt and get more information? Name of medication, why does she feel need to change. Thank you

## 2020-06-07 NOTE — Telephone Encounter (Signed)
See MyChart msg from 06/03/20.

## 2020-06-10 ENCOUNTER — Other Ambulatory Visit: Payer: Self-pay | Admitting: Obstetrics and Gynecology

## 2020-06-10 MED ORDER — LUPRON DEPOT (3-MONTH) 11.25 MG IM KIT
11.2500 mg | PACK | INTRAMUSCULAR | 1 refills | Status: DC
Start: 2020-06-10 — End: 2020-07-22

## 2020-06-10 NOTE — Telephone Encounter (Signed)
Rx has been switched to lupron she'll just need to come to clinic to have it adminstered

## 2020-06-18 ENCOUNTER — Telehealth: Payer: Self-pay

## 2020-06-18 NOTE — Telephone Encounter (Signed)
Prior authorization for Lupron [place] has been started through [employer]

## 2020-06-20 ENCOUNTER — Emergency Department
Admission: EM | Admit: 2020-06-20 | Discharge: 2020-06-20 | Disposition: A | Discharge status: Home / Self Care | Payer: 59 | Attending: Emergency Medicine | Admitting: Emergency Medicine

## 2020-06-20 ENCOUNTER — Other Ambulatory Visit: Payer: Self-pay

## 2020-06-20 ENCOUNTER — Encounter: Payer: Self-pay | Admitting: *Deleted

## 2020-06-20 ENCOUNTER — Emergency Department: Payer: 59

## 2020-06-20 DIAGNOSIS — M79641 Pain in right hand: Secondary | ICD-10-CM | POA: Insufficient documentation

## 2020-06-20 DIAGNOSIS — Z5321 Procedure and treatment not carried out due to patient leaving prior to being seen by health care provider: Secondary | ICD-10-CM | POA: Diagnosis not present

## 2020-06-20 NOTE — ED Notes (Signed)
Pt called x's 3 from the [facility], no response

## 2020-06-20 NOTE — ED Notes (Signed)
Pt called from [facility] to treatment room, no response 

## 2020-06-20 NOTE — ED Triage Notes (Signed)
Pt states a box on a conveyor belt ran into her R 5th digit while at work on Monday. Pt c/o worsening pain and decreased mobility in R hand and R 5th digit. Pt drove self. Pt also notes distal numbness in injured fingertip.

## 2020-06-20 NOTE — ED Notes (Signed)
Pt called for reassessment in the [facility], no response

## 2020-06-28 NOTE — Telephone Encounter (Signed)
[facility] Lupron [place] prior authorization was denied through [employer]. Please advise

## 2020-07-12 ENCOUNTER — Other Ambulatory Visit: Payer: Self-pay

## 2020-07-12 ENCOUNTER — Encounter: Payer: Self-pay | Admitting: Obstetrics and Gynecology

## 2020-07-12 ENCOUNTER — Ambulatory Visit (INDEPENDENT_AMBULATORY_CARE_PROVIDER_SITE_OTHER): Payer: 59 | Admitting: Obstetrics and Gynecology

## 2020-07-12 VITALS — BP 124/64 | HR 87 | Ht 61.5 in | Wt 273.0 lb

## 2020-07-12 DIAGNOSIS — Z4889 Encounter for other specified surgical aftercare: Secondary | ICD-10-CM

## 2020-07-12 DIAGNOSIS — N809 Endometriosis, unspecified: Secondary | ICD-10-CM

## 2020-07-12 NOTE — Progress Notes (Signed)
Postoperative Follow-up Patient presents post op from diagnostic laparoscopyt 6weeks ago for pelvic pain.  Subjective: Patient reports some improvement in her preop symptoms. Eating a regular diet without difficulty. Pain is controlled without any medications.  Activity: normal activities of daily living.  Objective: Blood pressure (!) 124/64, pulse 87, height 5' 1.5" (1.562 m), weight (!) 273 lb (123.8 kg), unknown if currently breastfeeding.  General: NAD Pulmonary: no increased work of breathing Abdomen: soft, non-tender, non-distended, incision(s) D/C/I Extremities: no edema Neurologic: normal gait    Admission on 05/21/2020, Discharged on 05/21/2020  Component Date Value Ref Range Status  . Tricyclic, Ur Screen 05/21/2020 NONE DETECTED  NONE DETECTED Final  . Amphetamines, Ur Screen 05/21/2020 NONE DETECTED  NONE DETECTED Final  . MDMA (Ecstasy)Ur Screen 05/21/2020 NONE DETECTED  NONE DETECTED Final  . Cocaine Metabolite,Ur [place] 05/21/2020 NONE DETECTED  NONE DETECTED Final  . Opiate, Ur Screen 05/21/2020 NONE DETECTED  NONE DETECTED Final  . Phencyclidine (PCP) Ur S 05/21/2020 NONE DETECTED  NONE DETECTED Final  . Cannabinoid 50 Ng, Ur [place] 05/21/2020 NONE DETECTED  NONE DETECTED Final  . Barbiturates, Ur Screen 05/21/2020 NONE DETECTED  NONE DETECTED Final  . Benzodiazepine, Ur Scrn 05/21/2020 NONE DETECTED  NONE DETECTED Final  . Methadone Scn, Ur 05/21/2020 NONE DETECTED  NONE DETECTED Final   Comment: (NOTE) Tricyclics + metabolites, urine    Cutoff 1000 ng/mL Amphetamines + metabolites, urine  Cutoff 1000 ng/mL MDMA (Ecstasy), urine              Cutoff 500 ng/mL Cocaine Metabolite, urine          Cutoff 300 ng/mL Opiate + metabolites, urine        Cutoff 300 ng/mL Phencyclidine (PCP), urine         Cutoff 25 ng/mL Cannabinoid, urine                 Cutoff 50 ng/mL Barbiturates + metabolites, urine  Cutoff 200 ng/mL Benzodiazepine, urine              Cutoff 200  ng/mL Methadone, urine                   Cutoff 300 ng/mL The urine drug screen provides only a preliminary, unconfirmed analytical test result and should not be used for non-medical purposes. Clinical consideration and professional judgment should be applied to any positive drug screen result due to possible interfering substances. A more specific alternate chemical method must be used in order to obtain a confirmed analytical result. Gas chromatography / mass spectrometry (GC/MS) is the preferred confirmat                          ory method. Performed at [facility], [address] [place], [place] [address]     Assessment: 34 y.o. s/p diagnostic laparoscopy for endometriosis stable  Plan: Patient has done well after surgery with no apparent complications.  I have discussed the post-operative course to date, and the expected progress moving forward.  The patient understands what complications to be concerned about.  I will see the patient in routine follow up, or sooner if needed.    Activity plan: No restriction.  Unable to get Lupron authorized declines trial of [place], has tried Lupron in past with improvement in symptoms after 6 months of use per patient.  Is interested in proceeding with BSO   Vena Austria, MD, Evern Core  [facility], [facility] 07/12/2020, 8:33 AM

## 2020-07-16 ENCOUNTER — Telehealth: Payer: Self-pay

## 2020-07-16 NOTE — Telephone Encounter (Signed)
We do not schedule pre ops, kellee does.

## 2020-07-16 NOTE — Telephone Encounter (Signed)
Pt calling; wants to set up for oopherectomy.  [phone]

## 2020-07-18 ENCOUNTER — Telehealth: Payer: Self-pay | Admitting: Obstetrics and Gynecology

## 2020-07-18 NOTE — Telephone Encounter (Signed)
Called to schedule Lap BSO with [facility]ae[facility]  DOS 8/12  H&P will do DOS - ok'd per AMS    Covid testing 8/10 @ 8-10:30, [facility] [employer], drive up and wear mask. Advised pt to quarantine until DOS.  Pre-admit phone call appointment to be requested - all appointments will be updated on pt MyChart. Explained that this appointment has a call window. Based on the time scheduled will indicate if the call will be received within a 4 hour window before 1:00 or after. Requested morning appt per pt.  Advised that pt may also receive calls from the hospital pharmacy and pre-service center.  Confirmed pt has [employer] as primary insurance. No secondary insurance.  Request AMS place orders since consents will be obtained DOS

## 2020-07-18 NOTE — Telephone Encounter (Signed)
-----   Message from Andreas Staebler, MD sent at 07/15/2020  9:21 PM EDT ----- °Regarding: Surgery °Surgery Booking Request °Patient Full Name:  Delayla D Sunga  °MRN: [id number]  °DOB: 11/03/1986  °Surgeon: Andreas Staebler, MD  °Requested Surgery Date and Time: 2-6 weeks °Primary Diagnosis AND Code: Endometriosis N80.9 °Secondary Diagnosis and Code:  °Surgical Procedure: Laparoscopic BSO °RNFA Requested?: No °L&D Notification: No °Admission Status: same day surgery °Length of Surgery: 50 min °Special Case Needs: No °H&P: Yes °Phone Interview???:  No °Interpreter: No °Medical Clearance:  No °Special Scheduling Instructions: No °Any known health/anesthesia issues, diabetes, sleep apnea, latex allergy, defibrillator/pacemaker?: No °Acuity: P3 °  (P1 highest, P2 delay may cause harm, P3 low, elective gyn, P4 lowest) ° °

## 2020-07-18 NOTE — Telephone Encounter (Signed)
-----   Message from Vena Austria, MD sent at 07/15/2020  9:21 PM EDT ----- Regarding: Surgery Surgery Booking Request Patient Full Name:  Gina Farmer  MRN: [id number]  DOB: 1986/06/01  Surgeon: Vena Austria, MD  Requested Surgery Date and Time: 2-6 weeks Primary Diagnosis AND Code: Endometriosis N80.9 Secondary Diagnosis and Code:  Surgical Procedure: Laparoscopic BSO RNFA Requested?: No L&D Notification: No Admission Status: same day surgery Length of Surgery: 50 min Special Case Needs: No H&P: Yes Phone Interview???:  No Interpreter: No Medical Clearance:  No Special Scheduling Instructions: No Any known health/anesthesia issues, diabetes, sleep apnea, latex allergy, defibrillator/pacemaker?: No Acuity: P3   (P1 highest, P2 delay may cause harm, P3 low, elective gyn, P4 lowest)

## 2020-07-22 ENCOUNTER — Other Ambulatory Visit: Payer: Self-pay | Admitting: Obstetrics and Gynecology

## 2020-07-22 ENCOUNTER — Other Ambulatory Visit: Payer: Self-pay

## 2020-07-22 ENCOUNTER — Encounter
Admission: RE | Admit: 2020-07-22 | Discharge: 2020-07-22 | Disposition: A | Discharge status: Home / Self Care | Payer: 59 | Source: Ambulatory Visit | Attending: Obstetrics and Gynecology | Admitting: Obstetrics and Gynecology

## 2020-07-22 DIAGNOSIS — Z01818 Encounter for other preprocedural examination: Secondary | ICD-10-CM | POA: Insufficient documentation

## 2020-07-22 NOTE — Patient Instructions (Addendum)
Your procedure is scheduled on: thurs 8/12 Report to Day Surgery. To find out your arrival time please call [phone] between 1PM - 3PM on .Wed. 8/11  Remember: Instructions that are not followed completely may result in serious medical risk,  up to and including death, or upon the discretion of your surgeon and anesthesiologist your  surgery may need to be rescheduled.     _X__ 1. Do not eat food after midnight the night before your procedure.                 No gum chewing or hard candies. You may drink clear liquids up to 2 hours                 before you are scheduled to arrive for your surgery- DO not drink clear                 liquids within 2 hours of the start of your surgery.                 Clear Liquids include:  water, apple juice without pulp, clear Gatorade, G2 or                  Gatorade Zero (avoid Red/Purple/Blue), Black Coffee or Tea (Do not add                 anything to coffee or tea). __x___2.   Complete the carbohydrate drink provided to you, 2 hours before arrival.  __X__2.  On the morning of surgery brush your teeth with toothpaste and water, you                may rinse your mouth with mouthwash if you wish.  Do not swallow any toothpaste of mouthwash.     _X__ 3.  No Alcohol for 24 hours before or after surgery.   _X__ 4.  Do Not Smoke or use e-cigarettes For 24 Hours Prior to Your Surgery.                 Do not use any chewable tobacco products for at least 6 hours prior to                 Surgery.  _X__  5.  Do not use any recreational drugs (marijuana, cocaine, heroin, ecstacy, MDMA or other)                For at least one week prior to your surgery.  Combination of these drugs with anesthesia                May have life threatening results.  ____  6.  Bring all medications with you on the day of surgery if instructed.   __x__  7.  Notify your doctor if there is any change in your medical condition      (cold, fever,  infections).     Do not wear jewelry, make-up, hairpins, clips or nail polish. Do not wear lotions, powders, or perfumes. You may wear deodorant. Do not shave 48 hours prior to surgery.  Do not bring valuables to the hospital.    [facility] is not responsible for any belongings or valuables.  Contacts, dentures or bridgework may not be worn into surgery. Leave your suitcase in the car. After surgery it may be brought to your room. For patients admitted to the hospital, discharge time is determined by your treatment team.   Patients discharged  the day of surgery will not be allowed to drive home.   Make arrangements for someone to be with you for the first 24 hours of your Same Day Discharge.    Please read over the following fact sheets that you were given:       __x__ Take these medicines the morning of surgery with A SIP OF WATER:    1. HYDROcodone-acetaminophen (NORCO/VICODIN) 5-325 MG tablet if needed  2. hydrOXYzine (VISTARIL) 25 MG capsule if needed  3. TIMOPTIC 0.5 % ophthalmic solution  4.  5.  6.  ____ Fleet Enema (as directed)   __x__ Use CHG Soap (or wipes) as directed  ____ Use Benzoyl Peroxide Gel as instructed  ____ Use inhalers on the day of surgery  ____ Stop metformin 2 days prior to surgery    ____ Take 1/2 of usual insulin dose the night before surgery. No insulin the morning          of surgery.   ____ Stop Coumadin/Plavix/aspirin on   __x__ Stop Anti-inflammatories / No ibuprofen aleve or aspirin until after the surgery.   May take tylenol   ____ Stop supplements until after surgery.    ____ Bring C-Pap to the hospital.

## 2020-07-23 ENCOUNTER — Other Ambulatory Visit
Admission: RE | Admit: 2020-07-23 | Discharge: 2020-07-23 | Disposition: A | Discharge status: Home / Self Care | Payer: 59 | Source: Ambulatory Visit | Attending: Obstetrics and Gynecology | Admitting: Obstetrics and Gynecology

## 2020-07-23 DIAGNOSIS — Z01812 Encounter for preprocedural laboratory examination: Secondary | ICD-10-CM | POA: Diagnosis present

## 2020-07-23 DIAGNOSIS — Z20822 Contact with and (suspected) exposure to covid-19: Secondary | ICD-10-CM | POA: Insufficient documentation

## 2020-07-23 LAB — TYPE AND SCREEN
ABO/RH(D): AB POS
Antibody Screen: NEGATIVE

## 2020-07-23 LAB — SARS CORONAVIRUS 2 (TAT 6-24 HRS): SARS Coronavirus 2: NEGATIVE

## 2020-07-25 ENCOUNTER — Encounter: Payer: Self-pay | Admitting: Obstetrics and Gynecology

## 2020-07-25 ENCOUNTER — Other Ambulatory Visit: Payer: Self-pay

## 2020-07-25 ENCOUNTER — Encounter
Admission: RE | Disposition: A | Discharge status: Home / Self Care | Payer: Self-pay | Source: Home / Self Care | Attending: Obstetrics and Gynecology

## 2020-07-25 ENCOUNTER — Ambulatory Visit: Payer: 59 | Admitting: Certified Registered"

## 2020-07-25 ENCOUNTER — Other Ambulatory Visit: Payer: Self-pay | Admitting: Obstetrics and Gynecology

## 2020-07-25 ENCOUNTER — Ambulatory Visit
Admission: RE | Admit: 2020-07-25 | Discharge: 2020-07-25 | Disposition: A | Discharge status: Home / Self Care | Payer: 59 | Attending: Obstetrics and Gynecology | Admitting: Obstetrics and Gynecology

## 2020-07-25 DIAGNOSIS — F17210 Nicotine dependence, cigarettes, uncomplicated: Secondary | ICD-10-CM | POA: Diagnosis not present

## 2020-07-25 DIAGNOSIS — N809 Endometriosis, unspecified: Secondary | ICD-10-CM | POA: Insufficient documentation

## 2020-07-25 DIAGNOSIS — Z888 Allergy status to other drugs, medicaments and biological substances status: Secondary | ICD-10-CM | POA: Insufficient documentation

## 2020-07-25 DIAGNOSIS — Z9889 Other specified postprocedural states: Secondary | ICD-10-CM

## 2020-07-25 DIAGNOSIS — Z79899 Other long term (current) drug therapy: Secondary | ICD-10-CM | POA: Insufficient documentation

## 2020-07-25 HISTORY — PX: LAPAROSCOPIC BILATERAL SALPINGO OOPHERECTOMY: SHX5890

## 2020-07-25 LAB — POCT PREGNANCY, URINE: Preg Test, Ur: NEGATIVE

## 2020-07-25 LAB — URINE DRUG SCREEN, QUALITATIVE (ARMC ONLY)
Amphetamines, Ur Screen: NOT DETECTED
Barbiturates, Ur Screen: NOT DETECTED
Benzodiazepine, Ur Scrn: NOT DETECTED
Cannabinoid 50 Ng, Ur ~~LOC~~: NOT DETECTED
Cocaine Metabolite,Ur ~~LOC~~: NOT DETECTED
MDMA (Ecstasy)Ur Screen: NOT DETECTED
Methadone Scn, Ur: NOT DETECTED
Opiate, Ur Screen: NOT DETECTED
Phencyclidine (PCP) Ur S: NOT DETECTED
Tricyclic, Ur Screen: NOT DETECTED

## 2020-07-25 LAB — ABO/RH: ABO/RH(D): AB POS

## 2020-07-25 SURGERY — SALPINGO-OOPHORECTOMY, BILATERAL, LAPAROSCOPIC
Anesthesia: General | Site: Abdomen | Laterality: Bilateral

## 2020-07-25 MED ORDER — IBUPROFEN 600 MG PO TABS: 600.0000 mg | ORAL_TABLET | Freq: Four times a day (QID) | ORAL | 0 refills | Status: DC | PRN

## 2020-07-25 MED ORDER — BUPIVACAINE HCL (PF) 0.5 % IJ SOLN
INTRAMUSCULAR | Status: AC
  Filled 2020-07-25: qty 30

## 2020-07-25 MED ORDER — FENTANYL CITRATE (PF) 100 MCG/2ML IJ SOLN
INTRAMUSCULAR | Status: AC
  Filled 2020-07-25: qty 2

## 2020-07-25 MED ORDER — OXYCODONE HCL 5 MG/5ML PO SOLN: 5.0000 mg | Freq: Once | ORAL | Status: AC | PRN

## 2020-07-25 MED ORDER — EPHEDRINE SULFATE 50 MG/ML IJ SOLN
INTRAMUSCULAR | Status: DC | PRN
  Administered 2020-07-25: 5 mg via INTRAVENOUS

## 2020-07-25 MED ORDER — FENTANYL CITRATE (PF) 100 MCG/2ML IJ SOLN
INTRAMUSCULAR | Status: AC
  Administered 2020-07-25: 25 ug via INTRAVENOUS
  Filled 2020-07-25: qty 2

## 2020-07-25 MED ORDER — ESTRADIOL 1 MG PO TABS
1.0000 mg | ORAL_TABLET | Freq: Every day | ORAL | 11 refills | Status: DC
Start: 2020-07-25 — End: 2021-07-14

## 2020-07-25 MED ORDER — FENTANYL CITRATE (PF) 100 MCG/2ML IJ SOLN
25.0000 ug | INTRAMUSCULAR | Status: AC | PRN
  Administered 2020-07-25 (×2): 25 ug via INTRAVENOUS
  Administered 2020-07-25: 50 ug via INTRAVENOUS
  Administered 2020-07-25: 25 ug via INTRAVENOUS

## 2020-07-25 MED ORDER — MIDAZOLAM HCL 2 MG/2ML IJ SOLN
INTRAMUSCULAR | Status: AC
  Filled 2020-07-25: qty 2

## 2020-07-25 MED ORDER — DEXAMETHASONE SODIUM PHOSPHATE 10 MG/ML IJ SOLN
INTRAMUSCULAR | Status: DC | PRN
  Administered 2020-07-25: 10 mg via INTRAVENOUS

## 2020-07-25 MED ORDER — 0.9 % SODIUM CHLORIDE (POUR BTL) OPTIME
TOPICAL | Status: DC | PRN
Start: 2020-07-25 — End: 2020-07-25
  Administered 2020-07-25: 500 mL

## 2020-07-25 MED ORDER — PROGESTERONE MICRONIZED 100 MG PO CAPS: 100.0000 mg | ORAL_CAPSULE | Freq: Every day | ORAL | 11 refills | Status: DC

## 2020-07-25 MED ORDER — OXYCODONE HCL 5 MG PO TABS: 5.0000 mg | ORAL_TABLET | Freq: Three times a day (TID) | ORAL | 0 refills | Status: DC | PRN

## 2020-07-25 MED ORDER — FENTANYL CITRATE (PF) 100 MCG/2ML IJ SOLN
INTRAMUSCULAR | Status: AC
  Administered 2020-07-25: 50 ug via INTRAVENOUS
  Filled 2020-07-25: qty 2

## 2020-07-25 MED ORDER — BUPIVACAINE HCL 0.5 % IJ SOLN
INTRAMUSCULAR | Status: DC | PRN
  Administered 2020-07-25: 17 mL

## 2020-07-25 MED ORDER — OXYCODONE HCL 5 MG PO TABS: 5.0000 mg | ORAL_TABLET | Freq: Once | ORAL | Status: AC | PRN

## 2020-07-25 MED ORDER — FAMOTIDINE 20 MG PO TABS
ORAL_TABLET | ORAL | Status: AC
  Administered 2020-07-25: 20 mg via ORAL
  Filled 2020-07-25: qty 1

## 2020-07-25 MED ORDER — PROPOFOL 10 MG/ML IV BOLUS
INTRAVENOUS | Status: DC | PRN
  Administered 2020-07-25: 200 mg via INTRAVENOUS

## 2020-07-25 MED ORDER — MIDAZOLAM HCL 2 MG/2ML IJ SOLN
INTRAMUSCULAR | Status: DC | PRN
  Administered 2020-07-25: 2 mg via INTRAVENOUS

## 2020-07-25 MED ORDER — KETOROLAC TROMETHAMINE 30 MG/ML IJ SOLN
INTRAMUSCULAR | Status: DC | PRN
  Administered 2020-07-25: 30 mg via INTRAVENOUS

## 2020-07-25 MED ORDER — CHLORHEXIDINE GLUCONATE 0.12 % MT SOLN: 15.0000 mL | Freq: Once | OROMUCOSAL | Status: AC

## 2020-07-25 MED ORDER — GLYCOPYRROLATE 0.2 MG/ML IJ SOLN
INTRAMUSCULAR | Status: DC | PRN
  Administered 2020-07-25: .2 mg via INTRAVENOUS

## 2020-07-25 MED ORDER — FAMOTIDINE 20 MG PO TABS: 20.0000 mg | ORAL_TABLET | Freq: Once | ORAL | Status: AC

## 2020-07-25 MED ORDER — ROCURONIUM BROMIDE 100 MG/10ML IV SOLN
INTRAVENOUS | Status: DC | PRN
  Administered 2020-07-25: 50 mg via INTRAVENOUS

## 2020-07-25 MED ORDER — OXYCODONE HCL 5 MG PO TABS
ORAL_TABLET | ORAL | Status: AC
  Administered 2020-07-25: 5 mg via ORAL
  Filled 2020-07-25: qty 1

## 2020-07-25 MED ORDER — FENTANYL CITRATE (PF) 100 MCG/2ML IJ SOLN
INTRAMUSCULAR | Status: DC | PRN
  Administered 2020-07-25: 100 ug via INTRAVENOUS
  Administered 2020-07-25 (×2): 50 ug via INTRAVENOUS

## 2020-07-25 MED ORDER — ACETAMINOPHEN 10 MG/ML IV SOLN
INTRAVENOUS | Status: DC | PRN
  Administered 2020-07-25: 1000 mg via INTRAVENOUS

## 2020-07-25 MED ORDER — CHLORHEXIDINE GLUCONATE 0.12 % MT SOLN
OROMUCOSAL | Status: AC
  Administered 2020-07-25: 15 mL via OROMUCOSAL
  Filled 2020-07-25: qty 15

## 2020-07-25 MED ORDER — LACTATED RINGERS IV SOLN: INTRAVENOUS | Status: DC

## 2020-07-25 MED ORDER — PROPOFOL 10 MG/ML IV BOLUS
INTRAVENOUS | Status: AC
  Filled 2020-07-25: qty 20

## 2020-07-25 MED ORDER — PHENYLEPHRINE HCL (PRESSORS) 10 MG/ML IV SOLN
INTRAVENOUS | Status: DC | PRN
  Administered 2020-07-25: 100 ug via INTRAVENOUS

## 2020-07-25 MED ORDER — DEXMEDETOMIDINE HCL IN NACL 200 MCG/50ML IV SOLN
INTRAVENOUS | Status: DC | PRN
  Administered 2020-07-25: 4 ug via INTRAVENOUS
  Administered 2020-07-25: 12 ug via INTRAVENOUS
  Administered 2020-07-25: 8 ug via INTRAVENOUS

## 2020-07-25 MED ORDER — SUGAMMADEX SODIUM 500 MG/5ML IV SOLN
INTRAVENOUS | Status: DC | PRN
  Administered 2020-07-25: 500 mg via INTRAVENOUS

## 2020-07-25 MED ORDER — ACETAMINOPHEN 10 MG/ML IV SOLN
INTRAVENOUS | Status: AC
  Filled 2020-07-25: qty 100

## 2020-07-25 MED ORDER — ORAL CARE MOUTH RINSE: 15.0000 mL | Freq: Once | OROMUCOSAL | Status: AC

## 2020-07-25 MED ORDER — LIDOCAINE HCL (CARDIAC) PF 100 MG/5ML IV SOSY
PREFILLED_SYRINGE | INTRAVENOUS | Status: DC | PRN
  Administered 2020-07-25: 100 mg via INTRAVENOUS

## 2020-07-25 MED ORDER — ONDANSETRON HCL 4 MG/2ML IJ SOLN
INTRAMUSCULAR | Status: DC | PRN
  Administered 2020-07-25 (×2): 4 mg via INTRAVENOUS

## 2020-07-25 MED ORDER — SUCCINYLCHOLINE CHLORIDE 20 MG/ML IJ SOLN
INTRAMUSCULAR | Status: DC | PRN
  Administered 2020-07-25: 120 mg via INTRAVENOUS

## 2020-07-25 SURGICAL SUPPLY — 38 items
ANCHOR TIS RET SYS 235ML (MISCELLANEOUS) ×3 IMPLANT
BAG URINE DRAIN 2000ML AR STRL (UROLOGICAL SUPPLIES) ×3 IMPLANT
BLADE SURG SZ11 CARB STEEL (BLADE) ×6 IMPLANT
CANISTER SUCT 1200ML W/VALVE (MISCELLANEOUS) ×3 IMPLANT
CATH FOLEY 2WAY  5CC 16FR (CATHETERS) ×2
CATH URTH 16FR FL 2W BLN LF (CATHETERS) ×1 IMPLANT
CHLORAPREP W/TINT 26 (MISCELLANEOUS) ×3 IMPLANT
COVER WAND RF STERILE (DRAPES) ×3 IMPLANT
DERMABOND ADVANCED (GAUZE/BANDAGES/DRESSINGS) ×2
DERMABOND ADVANCED .7 DNX12 (GAUZE/BANDAGES/DRESSINGS) ×1 IMPLANT
GAUZE 4X4 16PLY RFD (DISPOSABLE) ×3 IMPLANT
GLOVE BIO SURGEON STRL SZ7 (GLOVE) ×3 IMPLANT
GLOVE INDICATOR 7.5 STRL GRN (GLOVE) ×3 IMPLANT
GOWN STRL REUS W/ TWL LRG LVL3 (GOWN DISPOSABLE) ×2 IMPLANT
GOWN STRL REUS W/ TWL XL LVL3 (GOWN DISPOSABLE) IMPLANT
GOWN STRL REUS W/TWL LRG LVL3 (GOWN DISPOSABLE) ×4
GOWN STRL REUS W/TWL XL LVL3 (GOWN DISPOSABLE)
GRASPER SUT TROCAR 14GX15 (MISCELLANEOUS) ×6 IMPLANT
IRRIGATION STRYKERFLOW (MISCELLANEOUS) IMPLANT
IRRIGATOR STRYKERFLOW (MISCELLANEOUS)
IV LACTATED RINGERS 1000ML (IV SOLUTION) ×3 IMPLANT
KIT PINK PAD W/HEAD ARE REST (MISCELLANEOUS) ×3
KIT PINK PAD W/HEAD ARM REST (MISCELLANEOUS) ×1 IMPLANT
KIT TURNOVER CYSTO (KITS) ×3 IMPLANT
LABEL OR SOLS (LABEL) ×3 IMPLANT
NS IRRIG 500ML POUR BTL (IV SOLUTION) ×3 IMPLANT
PACK GYN LAPAROSCOPIC (MISCELLANEOUS) ×3 IMPLANT
PAD OB MATERNITY 4.3X12.25 (PERSONAL CARE ITEMS) ×3 IMPLANT
PAD PREP 24X41 OB/GYN DISP (PERSONAL CARE ITEMS) ×3 IMPLANT
SCISSORS METZENBAUM CVD 33 (INSTRUMENTS) IMPLANT
SET TUBE SMOKE EVAC HIGH FLOW (TUBING) ×3 IMPLANT
SHEARS HARMONIC ACE PLUS 36CM (ENDOMECHANICALS) ×3 IMPLANT
SLEEVE ENDOPATH XCEL 5M (ENDOMECHANICALS) ×6 IMPLANT
SUT MNCRL AB 4-0 PS2 18 (SUTURE) ×3 IMPLANT
SUT VIC AB 0 CT1 36 (SUTURE) ×3 IMPLANT
SUT VIC AB 2-0 UR6 27 (SUTURE) ×3 IMPLANT
TROCAR ENDO BLADELESS 11MM (ENDOMECHANICALS) ×3 IMPLANT
TROCAR XCEL NON-BLD 5MMX100MML (ENDOMECHANICALS) ×3 IMPLANT

## 2020-07-25 NOTE — Op Note (Signed)
Preoperative Diagnosis: 1) 34 y.o. with laparoscopic proven endometriosis  Postoperative Diagnosis: 1) 34 y.o. with laparoscopic proven endometriosis  Operation Performed: Laparoscopic bilateral salpingo-oophorectomy  Indication: 34 y.o. G2P0020  with laparoscopical proven endometriosis declining medical management  Surgeon: Vena Austria, MD  Assistant: Annamarie Major, MD this surgery required a high level surgical assistant with none other readily available  Anesthesia: General  Preoperative Antibiotics: none  Estimated Blood Loss: minimal  IV Fluids:  Urine Output::  Drains or Tubes: none  Implants: none  Specimens Removed: none  Complications: none  Intraoperative Findings: Normal tubes, ovaries, and uterus.  Patient Condition: stable  Procedure in Detail:  Patient was taken to the operating room where she was administered general anesthesia.  She was positioned in the dorsal lithotomy position utilizing Allen stirups, prepped and draped in the usual sterile fashion.  Prior to proceeding with procedure a time out was performed.  Attention was turned to the patient's pelvis.  A red rubber catheter was used to empty the patient's bladder.  An operative speculum was placed to allow visualization of the cervix.  The anterior lip of the cervix was grasped with a single tooth tenaculum, and a Hulka tenaculum was placed to allow manipulation of the uterus.  The operative speculum and single tooth tenaculum were then removed.  Attention was turned to the patient's abdomen.  The umbilicus was infiltrated with 1% Sensorcaine, before making a stab incision using an 11 blade scalpel.  A 50mm Excel trocar was then used to gain direct entry into the peritoneal cavity utilizing the camera to visualize progress of the trocar during placement.  Once peritoneal entry had been achieved, insufflation was started and pneumoperitoneum established at a pressure of . An additional  23mm excel trocars were placed under direct visualiztion in the in the right and left lower quadrant and the umbilical trocar was stepped up to an 51mm trocar.General inspection of the abdomen revealed the above noted findings.   The left IP ligament was identified, transected using a 23mm Harmonic scalpel.  The ovary and tube were then transected from their attachment to the mesosalpinx and uterus using the 61mm Harmonic.  Dr. Tiburcio Pea likewise transected the right adnexal structures.  The specimens were removed through the umbilical 39m trocar site using an endocatch bag.  Pedicles were inspected noted to be hemostatic.  The 28mm trocar site was closed under direct visualization using a Carter-Thompson and 0 Vicryl.  No fascial defects were palpated following closure.  Pneumoperitoneum was evacuated.  The trocars were removed.  The20mm trocar site was closed with 4-0 Monocryl in a subcuticular fashion.  All trocar sites were then dressed with surgical skin glue.  The Hulka tenaculum was removed.  Sponge needle and instrument counts were correct time two.  The patient tolerated the procedure well and was taken to the recovery room in stable condition.

## 2020-07-25 NOTE — Discharge Instructions (Signed)

## 2020-07-25 NOTE — Anesthesia Postprocedure Evaluation (Signed)
Anesthesia Post Note  Patient: DORINNE GRAEFF  Procedure(s) Performed: LAPAROSCOPIC BILATERAL SALPINGO OOPHORECTOMY (Bilateral Abdomen)  Patient location during evaluation: PACU Anesthesia Type: General Level of consciousness: awake and alert Pain management: pain level controlled Vital Signs Assessment: post-procedure vital signs reviewed and stable Respiratory status: spontaneous breathing, nonlabored ventilation, respiratory function stable and patient connected to nasal cannula oxygen Cardiovascular status: blood pressure returned to baseline and stable Postop Assessment: no apparent nausea or vomiting Anesthetic complications: no   No complications documented.   Last Vitals:  Vitals:   07/25/20 1207 07/25/20 1210  BP: 132/70 132/70  Pulse: 69 70  Resp: 16 15  Temp: 36.4 C 36.4 C  SpO2: 97% 98%    Last Pain:  Vitals:   07/25/20 1210  TempSrc: Tympanic  PainSc: 2                  Cleda Mccreedy Everley Evora

## 2020-07-25 NOTE — Anesthesia Procedure Notes (Signed)
Procedure Name: Intubation Performed by: Mohammed Kindle, CRNA Pre-anesthesia Checklist: Patient identified, Emergency Drugs available, Suction available and Patient being monitored Patient Re-evaluated:Patient Re-evaluated prior to induction Oxygen Delivery Method: Circle system utilized Preoxygenation: Pre-oxygenation with 100% oxygen Induction Type: IV induction Ventilation: Mask ventilation without difficulty Laryngoscope Size: McGraph and 3 Tube type: Oral Number of attempts: 1 Airway Equipment and Method: Stylet and Oral airway Placement Confirmation: ETT inserted through vocal cords under direct vision,  positive ETCO2,  breath sounds checked- equal and bilateral and CO2 detector Secured at: 21 cm Tube secured with: Tape Dental Injury: Teeth and Oropharynx as per pre-operative assessment  Difficulty Due To: Difficulty was anticipated Future Recommendations: Recommend- induction with short-acting agent, and alternative techniques readily available Comments: + Ramp positioning, intubation w/o incident TFH

## 2020-07-25 NOTE — Transfer of Care (Signed)
Immediate Anesthesia Transfer of Care Note  Patient: Gina Farmer  Procedure(s) Performed: LAPAROSCOPIC BILATERAL SALPINGO OOPHORECTOMY (Bilateral Abdomen)  Patient Location: PACU  Anesthesia Type:General  Level of Consciousness: awake, drowsy and patient cooperative  Airway & Oxygen Therapy: Patient Spontanous Breathing and Patient connected to face mask oxygen  Post-op Assessment: Report given to RN and Post -op Vital signs reviewed and stable  Post vital signs: Reviewed and stable  Last Vitals:  Vitals Value Taken Time  BP    Temp    Pulse 67 07/25/20 1100  Resp 32 07/25/20 1100  SpO2 99 % 07/25/20 1100  Vitals shown include unvalidated device data.  Last Pain:  Vitals:   07/25/20 0749  TempSrc: Temporal  PainSc: 3          Complications: No complications documented.

## 2020-07-25 NOTE — Anesthesia Preprocedure Evaluation (Addendum)
Anesthesia Evaluation  Patient identified by MRN, date of birth, ID band Patient awake    Reviewed: Allergy & Precautions, H&P , NPO status , Patient's Chart, lab work & pertinent test results  History of Anesthesia Complications Negative for: history of anesthetic complications  Airway Mallampati: III  TM Distance: <3 FB Neck ROM: full    Dental  (+) Chipped, Poor Dentition   Pulmonary neg shortness of breath, Current Smoker,    Pulmonary exam normal        Cardiovascular Exercise Tolerance: Good (-) angina(-) Past MI and (-) DOE negative cardio ROS Normal cardiovascular exam     Neuro/Psych PSYCHIATRIC DISORDERS negative neurological ROS  negative psych ROS   GI/Hepatic Neg liver ROS, GERD  Medicated and Controlled,  Endo/Other  negative endocrine ROS  Renal/GU      Musculoskeletal   Abdominal   Peds  Hematology negative hematology ROS (+)   Anesthesia Other Findings Past Medical History: No date: Anxiety No date: Back injury No date: Bipolar 1 disorder (HCC) No date: Bipolar 2 disorder (HCC) No date: Depression No date: Family history of ovarian cancer     Comment:  2/21 cancer genetic testing letter sent No date: GERD (gastroesophageal reflux disease) No date: Increased pressure in the eye, left No date: Morbid obesity (HCC) No date: Post traumatic stress disorder (PTSD) No date: PTSD (post-traumatic stress disorder)  Past Surgical History: 2009: APPENDECTOMY No date: INTRAOCULAR LENS IMPLANT, SECONDARY     Comment:  from eye injury 2003: LAPAROSCOPY 05/21/2020: LAPAROSCOPY; N/A     Comment:  Procedure: LAPAROSCOPY DIAGNOSTIC;  Surgeon: Vena Austria, MD;  Location: [facility] ORS;  Service: Gynecology;                Laterality: N/A; No date: SACROILIAC JOINT INJECTION  BMI    Body Mass Index: 50.73 kg/m      Reproductive/Obstetrics negative OB ROS                              Anesthesia Physical Anesthesia Plan  ASA: III  Anesthesia Plan: General ETT   Post-op Pain Management:    Induction: Intravenous  PONV Risk Score and Plan: Ondansetron, Dexamethasone, Midazolam and Treatment may vary due to age or medical condition  Airway Management Planned: Oral ETT  Additional Equipment:   Intra-op Plan:   Post-operative Plan: Extubation in OR  Informed Consent: I have reviewed the patients History and Physical, chart, labs and discussed the procedure including the risks, benefits and alternatives for the proposed anesthesia with the patient or authorized representative who has indicated his/her understanding and acceptance.     Dental Advisory Given  Plan Discussed with: Anesthesiologist, CRNA and Surgeon  Anesthesia Plan Comments: (Patient consented for risks of anesthesia including but not limited to:  - adverse reactions to medications - damage to eyes, teeth, lips or other oral mucosa - nerve damage due to positioning  - sore throat or hoarseness - Damage to heart, brain, nerves, lungs, other parts of body or loss of life  Patient voiced understanding.)        Anesthesia Quick Evaluation

## 2020-07-25 NOTE — H&P (Signed)
Obstetrics & Gynecology Surgery H&P    Chief Complaint: Scheduled Surgery   History of Present Illness: Patient is a 34 y.o. [id number] presenting for scheduled laparoscopic BSO, for the treatment or further evaluation of endometriosis.   Prior Treatments prior to proceeding with surgery include: diagnostic laparoscopy, prior depo provera and depo Lupron.    Preoperative Pap: 01/18/2020 Results: ASCUS with NEGATIVE high risk HPV  Preoperative Endometrial biopsy: N/A Preoperative Ultrasound: 05/06/2020  Findings: normal   Review of Systems:10 point review of systems  Past Medical History:  Patient Active Problem List   Diagnosis Date Noted  . Endometriosis determined by laparoscopy 05/31/2020  . Trichomoniasis 12/12/2019    Treated in MAU 12/12/2019     Past Surgical History:  Past Surgical History:  Procedure Laterality Date  . APPENDECTOMY  2009  . INTRAOCULAR LENS IMPLANT, SECONDARY     from eye injury  . LAPAROSCOPY  2003  . LAPAROSCOPY N/A 05/21/2020   Procedure: LAPAROSCOPY DIAGNOSTIC;  Surgeon: Vena Austria, MD;  Location: [facility] ORS;  Service: Gynecology;  Laterality: N/A;  . SACROILIAC JOINT INJECTION      Family History:  Family History  Problem Relation Age of Onset  . Hypertension Father   . Heart disease Father   . Narcolepsy Father   . Obesity Father   . Hypertension Brother   . Obesity Maternal Aunt   . Ovarian cancer Maternal Aunt 40  . Obesity Paternal Aunt   . Heart disease Maternal Grandfather   . Ovarian cancer Paternal Grandmother        36s  . Uterine cancer Other     Social History:  Social History   Socioeconomic History  . Marital status: Single    Spouse name: Not on file  . Number of children: Not on file  . Years of education: Not on file  . Highest education level: Not on file  Occupational History  . Not on file  Tobacco Use  . Smoking status: Current Every Day Smoker    Packs/day: 0.25    Types: Cigarettes  . Smokeless  tobacco: Never Used  Vaping Use  . Vaping Use: Former  . Quit date: 05/14/2018  Substance and Sexual Activity  . Alcohol use: Not Currently    Comment: social  . Drug use: Yes    Types: Marijuana    Comment: LAST SMOKED ON 12-11-19  . Sexual activity: Yes    Birth control/protection: None, Implant  Other Topics Concern  . Not on file  Social History Narrative  . Not on file   Social Determinants of Health   Financial Resource Strain:   . Difficulty of Paying Living Expenses:   Food Insecurity:   . Worried About Programme researcher, broadcasting/film/video in the Last Year:   . Barista in the Last Year:   Transportation Needs:   . Freight forwarder (Medical):   Marland Kitchen Lack of Transportation (Non-Medical):   Physical Activity:   . Days of Exercise per Week:   . Minutes of Exercise per Session:   Stress:   . Feeling of Stress :   Social Connections:   . Frequency of Communication with Friends and Family:   . Frequency of Social Gatherings with Friends and Family:   . Attends Religious Services:   . Active Member of Clubs or Organizations:   . Attends Banker Meetings:   Marland Kitchen Marital Status:   Intimate Partner Violence:   . Fear of Current or Ex-Partner:   .  Emotionally Abused:   Marland Kitchen Physically Abused:   . Sexually Abused:     Allergies:  Allergies  Allergen Reactions  . Trileptal [Oxcarbazepine] Swelling    Per pt, "leg swelling"  . Viibryd [Vilazodone Hcl] Swelling    Per pt "leg swelling"    Medications: Prior to Admission medications   Medication Sig Start Date End Date Taking? Authorizing Provider  HYDROcodone-acetaminophen (NORCO/VICODIN) 5-325 MG tablet Take 1-2 tablets by mouth every 4 (four) hours as needed for moderate pain or severe pain. 05/21/20  Yes Vena Austria, MD  hydrOXYzine (VISTARIL) 25 MG capsule Take 25 mg by mouth 3 (three) times daily.    Yes [provider]  ibuprofen (ADVIL) 200 MG tablet Take 4 tablets (800 mg total) by mouth every 6  (six) hours as needed for moderate pain. Patient taking differently: Take 400 mg by mouth every 6 (six) hours as needed for moderate pain.  05/21/20  Yes Vena Austria, MD  TIMOPTIC 0.5 % ophthalmic solution Place 1 drop into the left eye 2 (two) times daily. 03/25/20  Yes [provider]  XALATAN 0.005 % ophthalmic solution Place 1 drop into the left eye at bedtime.  03/25/20  Yes [provider]    Physical Exam Vitals: Blood pressure 130/86, pulse 71, temperature 98.1 F (36.7 C), temperature source Temporal, resp. rate 18, height 5' 1.5" (1.562 m), weight 123.8 kg, SpO2 100 %, unknown if currently breastfeeding. General: NAD HEENT: normocephalic, anicteric Pulmonary: No increased work of breathing, CTAB Cardiovascular: RRR, distal pulses 2+ Abdomen: soft, non-tender, non-distended Extremities: no edema, erythema, or tenderness Neurologic: Grossly intact Psychiatric: mood appropriate, affect full  Imaging No results found.  Assessment: 34 y.o. G2P0020 presenting for scheduled BSO  Plan: 1) I have had a careful discussion with this patient about all the options available and the risk/benefits of each. I have fully informed this patient that a laparoscopy may subject her to a variety of discomforts and risks: She understands that most patients have surgery with little difficulty, but problems can happen ranging from minor to fatal. These include nausea, vomiting, pain, bleeding, infection, poor healing, hernia, or formation of adhesions. Unexpected reactions may occur from any drug or anesthetic given. Unintended injury may occur to other pelvic or abdominal structures such as bladder, ureter (tube from kidney to bladder), or bowel. Nerves going from the pelvis to the legs may be injured. Any such injury may require immediate or later additional surgery to correct the problem. Excessive blood loss requiring transfusion is very unlikely but possible. Dangerous blood clots  may form in the legs or lungs. Physical and sexual activity will be restricted in varying degrees for an indeterminate period of time but most often 2-4 weeks. She understands that the plan is to do this laparoscopically, however, there is a chance that this will need to be performed via a larger incision. Finally, she understands that it is impossible to list every possible undesirable effect and that the condition for which surgery is done is not always cured or significantly improved, and in rare cases may be even worsen. Ample time was given to answer all questions. - once again discussed fertility implications of removing ovaries, she is adamant that she does not want children.  She is also aware of the vasomotor symptoms to expect, decrease life expectancy in studies.  She has declines trial of Orillisa.   2) Routine postoperative instructions were reviewed with the patient and her family in detail today including the expected  length of recovery and likely postoperative course.  The patient concurred with the proposed plan, giving informed written consent for the surgery today.  Patient instructed on the importance of being NPO after midnight prior to her procedure.  If warranted preoperative prophylactic antibiotics and SCDs ordered on call to the OR to meet SCIP guidelines and adhere to recommendation laid forth in ACOG Practice Bulletin Number 104 May 2009  "Antibiotic Prophylaxis for Gynecologic Procedures".     Vena Austria, MD, [facility], [facility] 07/25/2020, 9:09 AM

## 2020-07-26 ENCOUNTER — Telehealth: Payer: Self-pay | Admitting: Obstetrics and Gynecology

## 2020-07-26 ENCOUNTER — Other Ambulatory Visit: Payer: Self-pay | Admitting: Obstetrics and Gynecology

## 2020-07-26 LAB — SURGICAL PATHOLOGY

## 2020-07-26 NOTE — Telephone Encounter (Signed)
Pt calling; had surg yesterday; has questions regarding medication and note to go back to work on Sunday.  [phone]

## 2020-07-26 NOTE — Telephone Encounter (Signed)
Patient is calling need to speak with Dr. Jerene Pitch about some notes in her my chart. Patient would like a call if at all possible. Patient is aware CRS is seeing patient as this time .Please advise

## 2020-08-02 ENCOUNTER — Other Ambulatory Visit: Payer: Self-pay

## 2020-08-02 ENCOUNTER — Encounter: Payer: Self-pay | Admitting: Obstetrics and Gynecology

## 2020-08-02 ENCOUNTER — Ambulatory Visit (INDEPENDENT_AMBULATORY_CARE_PROVIDER_SITE_OTHER): Payer: 59 | Admitting: Obstetrics and Gynecology

## 2020-08-02 VITALS — BP 112/60 | Ht 61.5 in | Wt 272.0 lb

## 2020-08-02 DIAGNOSIS — Z3046 Encounter for surveillance of implantable subdermal contraceptive: Secondary | ICD-10-CM

## 2020-08-02 DIAGNOSIS — Z4889 Encounter for other specified surgical aftercare: Secondary | ICD-10-CM

## 2020-08-02 MED ORDER — HYDROCODONE-ACETAMINOPHEN 5-325 MG PO TABS: 1.0000 | ORAL_TABLET | Freq: Four times a day (QID) | ORAL | 0 refills | Status: DC | PRN

## 2020-08-02 MED ORDER — IBUPROFEN 600 MG PO TABS: 600.0000 mg | ORAL_TABLET | Freq: Four times a day (QID) | ORAL | 0 refills | Status: DC | PRN

## 2020-08-02 NOTE — Progress Notes (Signed)
Postoperative Follow-up Patient presents post op from laparoscopic BSO 1weeks ago for pelvic pain.  Subjective: Patient reports some improvement in her preop symptoms. Eating a regular diet without difficulty. Pain is controlled with current analgesics. Medications being used: prescription NSAID's including ibuprofen (Motrin) and narcotic analgesics including oxycodone/acetaminophen (Percocet, Tylox).  Activity: normal activities of daily living.  Objective: Blood pressure 112/60, height 5' 1.5" (1.562 m), weight 272 lb (123.4 kg), unknown if currently breastfeeding.  General: NAD Pulmonary: no increased work of breathing Abdomen: soft, non-tender, non-distended, incision(s) D/C/I Extremities: no edema Neurologic: normal gait    Admission on 07/25/2020, Discharged on 07/25/2020  Component Date Value Ref Range Status  . ABO/RH(D) 07/25/2020    Final                   Value:AB POS Performed at [facility], [address] [place], [place] [address]   . Tricyclic, Ur Screen 07/25/2020 NONE DETECTED  NONE DETECTED Final  . Amphetamines, Ur Screen 07/25/2020 NONE DETECTED  NONE DETECTED Final  . MDMA (Ecstasy)Ur Screen 07/25/2020 NONE DETECTED  NONE DETECTED Final  . Cocaine Metabolite,Ur [place] 07/25/2020 NONE DETECTED  NONE DETECTED Final  . Opiate, Ur Screen 07/25/2020 NONE DETECTED  NONE DETECTED Final  . Phencyclidine (PCP) Ur S 07/25/2020 NONE DETECTED  NONE DETECTED Final  . Cannabinoid 50 Ng, Ur [place] 07/25/2020 NONE DETECTED  NONE DETECTED Final  . Barbiturates, Ur Screen 07/25/2020 NONE DETECTED  NONE DETECTED Final  . Benzodiazepine, Ur Scrn 07/25/2020 NONE DETECTED  NONE DETECTED Final  . Methadone Scn, Ur 07/25/2020 NONE DETECTED  NONE DETECTED Final   Comment: (NOTE) Tricyclics + metabolites, urine    Cutoff 1000 ng/mL Amphetamines + metabolites, urine  Cutoff 1000 ng/mL MDMA (Ecstasy), urine              Cutoff 500 ng/mL Cocaine Metabolite, urine           Cutoff 300 ng/mL Opiate + metabolites, urine        Cutoff 300 ng/mL Phencyclidine (PCP), urine         Cutoff 25 ng/mL Cannabinoid, urine                 Cutoff 50 ng/mL Barbiturates + metabolites, urine  Cutoff 200 ng/mL Benzodiazepine, urine              Cutoff 200 ng/mL Methadone, urine                   Cutoff 300 ng/mL  The urine drug screen provides only a preliminary, unconfirmed analytical test result and should not be used for non-medical purposes. Clinical consideration and professional judgment should be applied to any positive drug screen result due to possible interfering substances. A more specific alternate chemical method must be used in order to obtain a confirmed analytical result. Gas chromatography / mass spectrometry (GC/MS) is the preferred confirm                          atory method. Performed at [facility], [address], [place], [place] [address]   . Preg Test, Ur 07/25/2020 NEGATIVE  NEGATIVE Final   Comment:        THE SENSITIVITY OF THIS METHODOLOGY IS >24 mIU/mL   . SURGICAL PATHOLOGY 07/25/2020    Final-Edited  Value:SURGICAL PATHOLOGY CASE: [id number] PATIENT: Gina Farmer Surgical Pathology Report     Specimen Submitted: A. Ovaries, fallopian tubes, bilateral  Clinical History: Endometriosis N80.9      DIAGNOSIS: A. BILATERAL FALLOPIAN TUBES AND OVARIES; SALPINGO-OOPHORECTOMY: - BILATERAL OVARIES WITH BENIGN CYSTIC FOLLICLES. - BILATERAL FALLOPIAN TUBES WITH NO SIGNIFICANT PATHOLOGIC ALTERATION. - NO EVIDENCE OF ENDOMETRIOSIS. - NEGATIVE FOR ATYPIA AND MALIGNANCY.   GROSS DESCRIPTION: A. Labeled: Bilateral fallopian tubes, bilateral ovaries Received: Formalin Type of procedure: Bilateral salpingo-oophorectomy Integrity: The specimen is received with the fallopian tubes separated from the ovaries.  Both ovaries have focal areas of disruption. Weight of specimen: 19.08 grams Size of  specimen:           Ovary: A: 4 x 2.5 x 2 cm; B: 3.7 x 2.5 x 2.2 cm; additional aggregate: 2.5 x 2.5 x 1.2 cm           Fallopian tube: A: 6.2 cm long by 0.6 cm in diameter; B: 6.3                          cm long by 0.5 cm in diameter Ovarian external surface: The serosa is tan-pink and smooth with focal cerebriform areas. Ovarian internal surface: Ovary A has a tan-white cut surface with 5 cysts ranging from 0.3 to 1.5 cm in greatest dimension.  The cysts have smooth walls and are grossly unremarkable.  Ovary B has a tan-white cut surface with 3 hemorrhagic cysts ranging from 0.4 to 1.2 cm in greatest dimension.  There is also a tan-white ill-defined cyst like structure, 1.2 x 0.6 x 0.6 cm. The additional ovarian fragments contain multiple unilocular cysts ranging from 0.1 to 0.2 cm in greatest dimension. Fallopian tube lumen: Both fallopian tubes are fimbriated with tan-pink, smooth, and glistening serosa.  Both fallopian tubes have findings consistent with prior tubal ligations.  Both have multiple paratubal cysts ranging from 0.2 to 0.3 cm in greatest dimension.  The lumens are pinpoint and patent.  Block summary: 1 - 2 - representative ovary A with cysts 3 - 4 -                          representative ovary B with cysts and area of discoloration 5 - representative additional ovarian fragments with cysts 6 - Fallopian tube A fimbria, bisected, and representative cross-sections 7 - Fallopian tube B fimbria, bisected, and representative cross-sections   Final Diagnosis performed by Georgeanna Harrison, MD.   Electronically signed 07/26/2020 12:02:08PM The electronic signature indicates that the named Attending Pathologist has evaluated the specimen Technical component performed at [facility], [address] [place], [place] [address] Lab: [phone] Dir: Jolene Schimke, MD, MMM  Professional component performed at [facility], [facility], [address] [place],  [place], [place] [address] Lab: [phone] Dir: Georgiann Cocker. Rubinas, MD      GYNECOLOGY PROCEDURE NOTE  Implanon removal discussed in detail.  Risks of infection, bleeding, nerve injury all reviewed.  Patient understands risks and desires to proceed.  Verbal consent obtained.  Patient is certain she wants the implanon removed.  All questions answered.  Procedure: Patient placed in dorsal supine with left arm above head, elbow flexed at 90 degrees, arm resting on examination table.  Implanon identified without problems.  Betadine scrub x3.  1 ml of 1% lidocaine injected under implanon device without problems.  Sterile gloves applied.  Small 0.5cm incision made at distal tip  of implanon device with 11 blade scalpel.  Implanon brought to incision and grasped with a small kelly clamp.  Implanon removed intact without problems.  Pressure applied to incision.  Hemostasis obtained.  Steri-strips applied, followed by bandage and compression dressing.  Patient tolerated procedure well.  No complications.   Assessment: 34 y.o. s/p BSO stable  Plan: Patient has done well after surgery with no apparent complications.  I have discussed the post-operative course to date, and the expected progress moving forward.  The patient understands what complications to be concerned about.  I will see the patient in routine follow up, or sooner if needed.    Activity plan: No heavy lifting.  Nexplanon removed  Refill vicodin and ibuprofen   Vena Austria, MD, [facility], [facility] 08/02/2020, 8:14 AM

## 2020-09-03 ENCOUNTER — Encounter: Payer: Self-pay | Admitting: Obstetrics and Gynecology

## 2020-09-03 ENCOUNTER — Ambulatory Visit (INDEPENDENT_AMBULATORY_CARE_PROVIDER_SITE_OTHER): Payer: 59 | Admitting: Obstetrics and Gynecology

## 2020-09-03 ENCOUNTER — Other Ambulatory Visit: Payer: Self-pay

## 2020-09-03 VITALS — BP 132/76 | HR 83 | Ht 61.5 in | Wt 274.0 lb

## 2020-09-03 DIAGNOSIS — Z01818 Encounter for other preprocedural examination: Secondary | ICD-10-CM

## 2020-09-03 MED ORDER — ONDANSETRON 4 MG PO TBDP: 4.0000 mg | ORAL_TABLET | Freq: Four times a day (QID) | ORAL | 0 refills | Status: DC | PRN

## 2020-09-03 NOTE — Progress Notes (Signed)
Postoperative Follow-up Patient presents post op from laproscopic bilateral salpingo-oophorectomy 6weeks ago for endometriosis.  Subjective: Patient reports marked improvement in her preop symptoms. Eating a regular diet without difficulty. Pain is controlled without any medications.  Activity: normal activities of daily living.  Objective: Blood pressure 132/76, pulse 83, height 5' 1.5" (1.562 m), weight 274 lb (124.3 kg), unknown if currently breastfeeding.  General: NAD Pulmonary: no increased work of breathing Abdomen: soft, non-tender, non-distended, incision(s) D/C/I GU: normal external female genitalia normal cervix, no CMT, uterus normal in shape and contour, no adnexal tenderness or masses Extremities: no edema Neurologic: normal gait    Admission on 07/25/2020, Discharged on 07/25/2020  Component Date Value Ref Range Status  . ABO/RH(D) 07/25/2020    Final                   Value:AB POS Performed at [facility], [address] [place], [place] [address]   . Tricyclic, Ur Screen 07/25/2020 NONE DETECTED  NONE DETECTED Final  . Amphetamines, Ur Screen 07/25/2020 NONE DETECTED  NONE DETECTED Final  . MDMA (Ecstasy)Ur Screen 07/25/2020 NONE DETECTED  NONE DETECTED Final  . Cocaine Metabolite,Ur [place] 07/25/2020 NONE DETECTED  NONE DETECTED Final  . Opiate, Ur Screen 07/25/2020 NONE DETECTED  NONE DETECTED Final  . Phencyclidine (PCP) Ur S 07/25/2020 NONE DETECTED  NONE DETECTED Final  . Cannabinoid 50 Ng, Ur [place] 07/25/2020 NONE DETECTED  NONE DETECTED Final  . Barbiturates, Ur Screen 07/25/2020 NONE DETECTED  NONE DETECTED Final  . Benzodiazepine, Ur Scrn 07/25/2020 NONE DETECTED  NONE DETECTED Final  . Methadone Scn, Ur 07/25/2020 NONE DETECTED  NONE DETECTED Final   Comment: (NOTE) Tricyclics + metabolites, urine    Cutoff 1000 ng/mL Amphetamines + metabolites, urine  Cutoff 1000 ng/mL MDMA (Ecstasy), urine              Cutoff 500 ng/mL Cocaine  Metabolite, urine          Cutoff 300 ng/mL Opiate + metabolites, urine        Cutoff 300 ng/mL Phencyclidine (PCP), urine         Cutoff 25 ng/mL Cannabinoid, urine                 Cutoff 50 ng/mL Barbiturates + metabolites, urine  Cutoff 200 ng/mL Benzodiazepine, urine              Cutoff 200 ng/mL Methadone, urine                   Cutoff 300 ng/mL  The urine drug screen provides only a preliminary, unconfirmed analytical test result and should not be used for non-medical purposes. Clinical consideration and professional judgment should be applied to any positive drug screen result due to possible interfering substances. A more specific alternate chemical method must be used in order to obtain a confirmed analytical result. Gas chromatography / mass spectrometry (GC/MS) is the preferred confirm                          atory method. Performed at [facility], [address], [place], [place] [address]   . Preg Test, Ur 07/25/2020 NEGATIVE  NEGATIVE Final   Comment:        THE SENSITIVITY OF THIS METHODOLOGY IS >24 mIU/mL   . SURGICAL PATHOLOGY 07/25/2020    Final-Edited  Value:SURGICAL PATHOLOGY CASE: [id number] PATIENT: Boone Master Surgical Pathology Report     Specimen Submitted: A. Ovaries, fallopian tubes, bilateral  Clinical History: Endometriosis N80.9      DIAGNOSIS: A. BILATERAL FALLOPIAN TUBES AND OVARIES; SALPINGO-OOPHORECTOMY: - BILATERAL OVARIES WITH BENIGN CYSTIC FOLLICLES. - BILATERAL FALLOPIAN TUBES WITH NO SIGNIFICANT PATHOLOGIC ALTERATION. - NO EVIDENCE OF ENDOMETRIOSIS. - NEGATIVE FOR ATYPIA AND MALIGNANCY.   GROSS DESCRIPTION: A. Labeled: Bilateral fallopian tubes, bilateral ovaries Received: Formalin Type of procedure: Bilateral salpingo-oophorectomy Integrity: The specimen is received with the fallopian tubes separated from the ovaries.  Both ovaries have focal areas of disruption. Weight of specimen:  19.08 grams Size of specimen:           Ovary: A: 4 x 2.5 x 2 cm; B: 3.7 x 2.5 x 2.2 cm; additional aggregate: 2.5 x 2.5 x 1.2 cm           Fallopian tube: A: 6.2 cm long by 0.6 cm in diameter; B: 6.3                          cm long by 0.5 cm in diameter Ovarian external surface: The serosa is tan-pink and smooth with focal cerebriform areas. Ovarian internal surface: Ovary A has a tan-white cut surface with 5 cysts ranging from 0.3 to 1.5 cm in greatest dimension.  The cysts have smooth walls and are grossly unremarkable.  Ovary B has a tan-white cut surface with 3 hemorrhagic cysts ranging from 0.4 to 1.2 cm in greatest dimension.  There is also a tan-white ill-defined cyst like structure, 1.2 x 0.6 x 0.6 cm. The additional ovarian fragments contain multiple unilocular cysts ranging from 0.1 to 0.2 cm in greatest dimension. Fallopian tube lumen: Both fallopian tubes are fimbriated with tan-pink, smooth, and glistening serosa.  Both fallopian tubes have findings consistent with prior tubal ligations.  Both have multiple paratubal cysts ranging from 0.2 to 0.3 cm in greatest dimension.  The lumens are pinpoint and patent.  Block summary: 1 - 2 - representative ovary A with cysts 3 - 4 -                          representative ovary B with cysts and area of discoloration 5 - representative additional ovarian fragments with cysts 6 - Fallopian tube A fimbria, bisected, and representative cross-sections 7 - Fallopian tube B fimbria, bisected, and representative cross-sections   Final Diagnosis performed by Georgeanna Harrison, MD.   Electronically signed 07/26/2020 12:02:08PM The electronic signature indicates that the named Attending Pathologist has evaluated the specimen Technical component performed at [place], [address], [place], [place] [address] Lab: [phone] Dir: Jolene Schimke, MD, MMM  Professional component performed at [facility], [facility], [address] [place], [place], [place] [address] Lab: [phone] Dir: Georgiann Cocker. Oneita Kras, MD     Assessment: 34 y.o. s/p laparoscopic bilateral salpingectomy stable  Plan: Patient has done well after surgery with no apparent complications.  I have discussed the post-operative course to date, and the expected progress moving forward.  The patient understands what complications to be concerned about.  I will see the patient in routine follow up, or sooner if needed.    Activity plan: No restriction.  Continue Prometrium/estrace   Vena Austria, MD, [facility], [facility] 09/03/2020, 8:09 AM

## 2020-09-17 ENCOUNTER — Other Ambulatory Visit: Payer: Self-pay

## 2020-09-17 ENCOUNTER — Emergency Department
Admission: EM | Admit: 2020-09-17 | Discharge: 2020-09-17 | Disposition: A | Discharge status: Home / Self Care | Payer: 59 | Attending: Emergency Medicine | Admitting: Emergency Medicine

## 2020-09-17 ENCOUNTER — Encounter: Payer: Self-pay | Admitting: Emergency Medicine

## 2020-09-17 ENCOUNTER — Emergency Department: Payer: 59

## 2020-09-17 DIAGNOSIS — S93401A Sprain of unspecified ligament of right ankle, initial encounter: Secondary | ICD-10-CM | POA: Insufficient documentation

## 2020-09-17 DIAGNOSIS — F17210 Nicotine dependence, cigarettes, uncomplicated: Secondary | ICD-10-CM | POA: Diagnosis not present

## 2020-09-17 DIAGNOSIS — W228XXA Striking against or struck by other objects, initial encounter: Secondary | ICD-10-CM | POA: Insufficient documentation

## 2020-09-17 DIAGNOSIS — S99911A Unspecified injury of right ankle, initial encounter: Secondary | ICD-10-CM | POA: Diagnosis present

## 2020-09-17 MED ORDER — KETOROLAC TROMETHAMINE 60 MG/2ML IM SOLN: 60.0000 mg | Freq: Once | INTRAMUSCULAR | Status: DC

## 2020-09-17 MED ORDER — OXYCODONE-ACETAMINOPHEN 7.5-325 MG PO TABS: 1.0000 | ORAL_TABLET | Freq: Four times a day (QID) | ORAL | 0 refills | Status: DC | PRN

## 2020-09-17 MED ORDER — INDOMETHACIN 50 MG PO CAPS: 50.0000 mg | ORAL_CAPSULE | Freq: Three times a day (TID) | ORAL | 0 refills | Status: DC | PRN

## 2020-09-17 MED ORDER — TRAMADOL HCL 50 MG PO TABS
50.0000 mg | ORAL_TABLET | Freq: Once | ORAL | Status: AC
  Administered 2020-09-17: 50 mg via ORAL
  Filled 2020-09-17: qty 1

## 2020-09-17 NOTE — ED Triage Notes (Signed)
Pt to triage via w/c with no distress noted; reports PTA stepped thru a pallet while leaving work, injuring rt ankle; denies desire to file workers comp as she was "clocked out"

## 2020-09-17 NOTE — ED Notes (Signed)
Pt verbalizes understanding of d/c instructions, medications and follow up 

## 2020-09-17 NOTE — ED Provider Notes (Signed)
[facility] Emergency Department Provider Note   ____________________________________________   First MD Initiated Contact with Patient 09/17/20 [phone]     (approximate)  I have reviewed the triage vital signs and the nursing notes.   HISTORY  Chief Complaint Ankle Pain    HPI Gina Farmer is a 34 y.o. female patient complain of right ankle pain secondary to stepping into a wooden pallet leaving work.  Patient states pain increased with weightbearing.  Rates pain as 10/10.  Described pain as "achy".  Ice pack applied in triage.         Past Medical History:  Diagnosis Date  . Anxiety   . Back injury   . Bipolar 1 disorder (HCC)   . Bipolar 2 disorder (HCC)   . Depression   . Family history of ovarian cancer    2/21 cancer genetic testing letter sent  . GERD (gastroesophageal reflux disease)   . Increased pressure in the eye, left   . Morbid obesity (HCC)   . Post traumatic stress disorder (PTSD)   . PTSD (post-traumatic stress disorder)     Patient Active Problem List   Diagnosis Date Noted  . Endometriosis determined by laparoscopy 05/31/2020  . Trichomoniasis 12/12/2019    Past Surgical History:  Procedure Laterality Date  . APPENDECTOMY  2009  . INTRAOCULAR LENS IMPLANT, SECONDARY     from eye injury  . LAPAROSCOPIC BILATERAL SALPINGO OOPHERECTOMY Bilateral 07/25/2020   Procedure: LAPAROSCOPIC BILATERAL SALPINGO OOPHORECTOMY;  Surgeon: Vena Austria, MD;  Location: [facility] ORS;  Service: Gynecology;  Laterality: Bilateral;  . LAPAROSCOPY  2003  . LAPAROSCOPY N/A 05/21/2020   Procedure: LAPAROSCOPY DIAGNOSTIC;  Surgeon: Vena Austria, MD;  Location: [facility] ORS;  Service: Gynecology;  Laterality: N/A;  . SACROILIAC JOINT INJECTION      Prior to Admission medications   Medication Sig Start Date End Date Taking? Authorizing Provider  estradiol (ESTRACE) 1 MG tablet Take 1 tablet (1 mg total) by mouth daily. 07/25/20 07/25/21   Vena Austria, MD  HYDROcodone-acetaminophen (NORCO/VICODIN) 5-325 MG tablet Take 1 tablet by mouth every 6 (six) hours as needed. 08/02/20   Vena Austria, MD  hydrOXYzine (VISTARIL) 25 MG capsule Take 25 mg by mouth 3 (three) times daily.     [provider]  ibuprofen (ADVIL) 600 MG tablet Take 1 tablet (600 mg total) by mouth every 6 (six) hours as needed for mild pain or cramping. 08/02/20   Vena Austria, MD  indomethacin (INDOCIN) 50 MG capsule Take 1 capsule (50 mg total) by mouth 3 (three) times daily as needed. 09/17/20   Joni Reining, PA-C  ondansetron (ZOFRAN ODT) 4 MG disintegrating tablet Take 1 tablet (4 mg total) by mouth every 6 (six) hours as needed for nausea. 09/03/20   Vena Austria, MD  oxyCODONE-acetaminophen (PERCOCET) 7.5-325 MG tablet Take 1 tablet by mouth every 6 (six) hours as needed. 09/17/20   Joni Reining, PA-C  progesterone (PROMETRIUM) 100 MG capsule Take 1 capsule (100 mg total) by mouth daily. 07/25/20   Vena Austria, MD  TIMOPTIC 0.5 % ophthalmic solution Place 1 drop into the left eye 2 (two) times daily. 03/25/20   [provider]  XALATAN 0.005 % ophthalmic solution Place 1 drop into the left eye at bedtime.  03/25/20   [provider]    Allergies Trileptal [oxcarbazepine] and Elliot Cousin hcl]  Family History  Problem Relation Age of Onset  . Hypertension Father   . Heart disease  Father   . Narcolepsy Father   . Obesity Father   . Hypertension Brother   . Obesity Maternal Aunt   . Ovarian cancer Maternal Aunt 40  . Obesity Paternal Aunt   . Heart disease Maternal Grandfather   . Ovarian cancer Paternal Grandmother        59s  . Uterine cancer Other     Social History Social History   Tobacco Use  . Smoking status: Current Every Day Smoker    Packs/day: 0.25    Types: Cigarettes  . Smokeless tobacco: Never Used  Vaping Use  . Vaping Use: Former  . Quit date: 05/14/2018  Substance Use  Topics  . Alcohol use: Not Currently    Comment: social  . Drug use: Yes    Types: Marijuana    Comment: LAST SMOKED ON 12-11-19    Review of Systems Constitutional: No fever/chills Eyes: No visual changes. ENT: No sore throat. Cardiovascular: Denies chest pain. Respiratory: Denies shortness of breath. Gastrointestinal: No abdominal pain.  No nausea, no vomiting.  No diarrhea.  No constipation. Genitourinary: Negative for dysuria. Musculoskeletal: Right ankle pain and swelling. Skin: Negative for rash. Neurological: Negative for headaches, focal weakness or numbness. Psychiatric:  Anxiety, bipolar, depression, and PTSD. Allergic/Immunilogical: See medication list. ____________________________________________   PHYSICAL EXAM:  VITAL SIGNS: ED Triage Vitals  Enc Vitals Group     BP 09/17/20 0446 115/88     Pulse Rate 09/17/20 0446 86     Resp 09/17/20 0446 18     Temp 09/17/20 0446 98.6 F (37 C)     Temp Source 09/17/20 0446 Oral     SpO2 09/17/20 0446 99 %     Weight 09/17/20 0444 264 lb (119.7 kg)     Height 09/17/20 0444 5\' 1"  (1.549 m)     Head Circumference --      Peak Flow --      Pain Score 09/17/20 0444 10     Pain Loc --      Pain Edu? --      Excl. in GC? --     Constitutional: Alert and oriented. Well appearing and in no acute distress. **}Cardiovascular: Normal rate, regular rhythm. Grossly normal heart sounds.  Good peripheral circulation. Respiratory: Normal respiratory effort.  No retractions. Lungs CTAB. Gastrointestinal: Soft and nontender. No distention. No abdominal bruits. No CVA tenderness. Genitourinary: Deferred Musculoskeletal: No obvious deformity to the right ankle.  Lateral malleolus edema.  Moderate guarding palpation the lateral malleolus.   Neurologic:  Normal speech and language. No gross focal neurologic deficits are appreciated. No gait instability. Skin:  Skin is warm, dry and intact. No rash noted. Psychiatric: Mood and affect  are normal. Speech and behavior are normal.  ____________________________________________   LABS (all labs ordered are listed, but only abnormal results are displayed)  Labs Reviewed - No data to display ____________________________________________  EKG   ____________________________________________  RADIOLOGY I, 11/17/20, personally viewed and evaluated these images (plain radiographs) as part of my medical decision making, as well as reviewing the written report by the radiologist.  ED MD interpretation: chronic distal fibular fracture.  No new fracture. Official radiology report(s): DG Ankle Complete Right  Result Date: 09/17/2020 CLINICAL DATA:  Initial evaluation for acute trauma, fall. EXAM: RIGHT ANKLE - COMPLETE 3+ VIEW COMPARISON:  Prior radiograph from 07/18/2018. FINDINGS: No acute fracture dislocation. Chronic appearing fracture deformity at the lateral malleolus noted, grossly similar as compared to prior radiograph. Ankle mortise remains  approximated. Talar dome intact. Posterior plantar calcaneal enthesophytes. Mild diffuse soft tissue swelling about the ankle. IMPRESSION: 1. No acute fracture or dislocation. 2. Chronic fracture deformity at the lateral malleolus. 3. Mild diffuse soft tissue swelling about the ankle. Electronically Signed   By: Rise Mu M.D.   On: 09/17/2020 05:44    ____________________________________________   PROCEDURES  Procedure(s) performed (including Critical Care):  Procedures   ____________________________________________   INITIAL IMPRESSION / ASSESSMENT AND PLAN / ED COURSE  As part of my medical decision making, I reviewed the following data within the electronic MEDICAL RECORD NUMBER     Patient presents with left ankle pain secondary to stepping through awooden pallet.Discussed x-ray findings with patient consistent with ankle sprain.  Patient placed in ankle stirrup and given crutches assist with ambulation.   Advise follow discharge care instruction take medication as directed.  Given a work note.          ____________________________________________   FINAL CLINICAL IMPRESSION(S) / ED DIAGNOSES  Final diagnoses:  Sprain of right ankle, unspecified ligament, initial encounter     ED Discharge Orders         Ordered    oxyCODONE-acetaminophen (PERCOCET) 7.5-325 MG tablet  Every 6 hours PRN        09/17/20 0747    indomethacin (INDOCIN) 50 MG capsule  3 times daily PRN        09/17/20 0748          *Please note:  Gina Farmer was evaluated in Emergency Department on 09/17/2020 for the symptoms described in the history of present illness. She was evaluated in the context of the global COVID-19 pandemic, which necessitated consideration that the patient might be at risk for infection with the SARS-CoV-2 virus that causes COVID-19. Institutional protocols and algorithms that pertain to the evaluation of patients at risk for COVID-19 are in a state of rapid change based on information released by regulatory bodies including the CDC and federal and state organizations. These policies and algorithms were followed during the patient's care in the ED.  Some ED evaluations and interventions may be delayed as a result of limited staffing during and the pandemic.*   Note:  This document was prepared using Dragon voice recognition software and may include unintentional dictation errors.    Joni Reining, PA-C 09/17/20 1025    Willy Eddy, MD 09/23/20 [phone]

## 2020-09-20 ENCOUNTER — Emergency Department
Admission: EM | Admit: 2020-09-20 | Discharge: 2020-09-21 | Disposition: A | Discharge status: Home / Self Care | Payer: 59 | Attending: Emergency Medicine | Admitting: Emergency Medicine

## 2020-09-20 DIAGNOSIS — Y9263 Factory as the place of occurrence of the external cause: Secondary | ICD-10-CM | POA: Insufficient documentation

## 2020-09-20 DIAGNOSIS — Y990 Civilian activity done for income or pay: Secondary | ICD-10-CM | POA: Diagnosis not present

## 2020-09-20 DIAGNOSIS — F17210 Nicotine dependence, cigarettes, uncomplicated: Secondary | ICD-10-CM | POA: Insufficient documentation

## 2020-09-20 DIAGNOSIS — S62639A Displaced fracture of distal phalanx of unspecified finger, initial encounter for closed fracture: Secondary | ICD-10-CM

## 2020-09-20 DIAGNOSIS — W208XXA Other cause of strike by thrown, projected or falling object, initial encounter: Secondary | ICD-10-CM | POA: Insufficient documentation

## 2020-09-20 DIAGNOSIS — S62632A Displaced fracture of distal phalanx of right middle finger, initial encounter for closed fracture: Secondary | ICD-10-CM | POA: Diagnosis not present

## 2020-09-20 DIAGNOSIS — S60942A Unspecified superficial injury of right middle finger, initial encounter: Secondary | ICD-10-CM | POA: Diagnosis present

## 2020-09-21 ENCOUNTER — Other Ambulatory Visit: Payer: Self-pay

## 2020-09-21 ENCOUNTER — Emergency Department: Payer: 59

## 2020-09-21 ENCOUNTER — Encounter: Payer: Self-pay | Admitting: Emergency Medicine

## 2020-09-21 NOTE — ED Triage Notes (Signed)
Patient states that she had 108 lb dropped on her right third finger at work. Patient states that she does not want to file worker's compensation at this time.

## 2020-09-21 NOTE — Discharge Instructions (Signed)
Wear aluminum finger splint as needed for comfort.  Continue to ice affected area to reduce swelling.  Return to the ER for worsening symptoms or other concerns.

## 2020-09-21 NOTE — ED Provider Notes (Signed)
[facility] Emergency Department Provider Note   ____________________________________________   First MD Initiated Contact with Patient 09/21/20 0104     (approximate)  I have reviewed the triage vital signs and the nursing notes.   HISTORY  Chief Complaint Hand Pain    HPI Gina Farmer is a 34 y.o. female who presents to the ED from home with a chief complaint of right third finger injury which occurred approximately 24 hours ago when a 108 pound package dropped onto her finger at work at [employer].  Declines to file Workmen's Comp.  Patient is right-hand dominant.  Has been icing the affected area and states swelling has gone down considerably.  Voices no other complaints or injuries.     Past Medical History:  Diagnosis Date  . Anxiety   . Back injury   . Bipolar 1 disorder (HCC)   . Bipolar 2 disorder (HCC)   . Depression   . Family history of ovarian cancer    2/21 cancer genetic testing letter sent  . GERD (gastroesophageal reflux disease)   . Increased pressure in the eye, left   . Morbid obesity (HCC)   . Post traumatic stress disorder (PTSD)   . PTSD (post-traumatic stress disorder)     Patient Active Problem List   Diagnosis Date Noted  . Endometriosis determined by laparoscopy 05/31/2020  . Trichomoniasis 12/12/2019    Past Surgical History:  Procedure Laterality Date  . APPENDECTOMY  2009  . INTRAOCULAR LENS IMPLANT, SECONDARY     from eye injury  . LAPAROSCOPIC BILATERAL SALPINGO OOPHERECTOMY Bilateral 07/25/2020   Procedure: LAPAROSCOPIC BILATERAL SALPINGO OOPHORECTOMY;  Surgeon: Vena Austria, MD;  Location: [facility] ORS;  Service: Gynecology;  Laterality: Bilateral;  . LAPAROSCOPY  2003  . LAPAROSCOPY N/A 05/21/2020   Procedure: LAPAROSCOPY DIAGNOSTIC;  Surgeon: Vena Austria, MD;  Location: [facility] ORS;  Service: Gynecology;  Laterality: N/A;  . SACROILIAC JOINT INJECTION      Prior to Admission medications   Medication  Sig Start Date End Date Taking? Authorizing Provider  estradiol (ESTRACE) 1 MG tablet Take 1 tablet (1 mg total) by mouth daily. 07/25/20 07/25/21  Vena Austria, MD  HYDROcodone-acetaminophen (NORCO/VICODIN) 5-325 MG tablet Take 1 tablet by mouth every 6 (six) hours as needed. 08/02/20   Vena Austria, MD  hydrOXYzine (VISTARIL) 25 MG capsule Take 25 mg by mouth 3 (three) times daily.     [provider]  ibuprofen (ADVIL) 600 MG tablet Take 1 tablet (600 mg total) by mouth every 6 (six) hours as needed for mild pain or cramping. 08/02/20   Vena Austria, MD  indomethacin (INDOCIN) 50 MG capsule Take 1 capsule (50 mg total) by mouth 3 (three) times daily as needed. 09/17/20   Joni Reining, PA-C  ondansetron (ZOFRAN ODT) 4 MG disintegrating tablet Take 1 tablet (4 mg total) by mouth every 6 (six) hours as needed for nausea. 09/03/20   Vena Austria, MD  oxyCODONE-acetaminophen (PERCOCET) 7.5-325 MG tablet Take 1 tablet by mouth every 6 (six) hours as needed. 09/17/20   Joni Reining, PA-C  progesterone (PROMETRIUM) 100 MG capsule Take 1 capsule (100 mg total) by mouth daily. 07/25/20   Vena Austria, MD  TIMOPTIC 0.5 % ophthalmic solution Place 1 drop into the left eye 2 (two) times daily. 03/25/20   [provider]  XALATAN 0.005 % ophthalmic solution Place 1 drop into the left eye at bedtime.  03/25/20   [provider]  Allergies Trileptal [oxcarbazepine] and Viibryd [vilazodone hcl]  Family History  Problem Relation Age of Onset  . Hypertension Father   . Heart disease Father   . Narcolepsy Father   . Obesity Father   . Hypertension Brother   . Obesity Maternal Aunt   . Ovarian cancer Maternal Aunt 40  . Obesity Paternal Aunt   . Heart disease Maternal Grandfather   . Ovarian cancer Paternal Grandmother        85s  . Uterine cancer Other     Social History Social History   Tobacco Use  . Smoking status: Current Every Day Smoker     Packs/day: 0.25    Types: Cigarettes  . Smokeless tobacco: Never Used  Vaping Use  . Vaping Use: Former  . Quit date: 05/14/2018  Substance Use Topics  . Alcohol use: Not Currently    Comment: social  . Drug use: Yes    Types: Marijuana    Comment: occ    Review of Systems  Constitutional: No fever/chills Eyes: No visual changes. ENT: No sore throat. Cardiovascular: Denies chest pain. Respiratory: Denies shortness of breath. Gastrointestinal: No abdominal pain.  No nausea, no vomiting.  No diarrhea.  No constipation. Genitourinary: Negative for dysuria. Musculoskeletal: Positive for right third digit pain and injury.  Negative for back pain. Skin: Negative for rash. Neurological: Negative for headaches, focal weakness or numbness.   ____________________________________________   PHYSICAL EXAM:  VITAL SIGNS: ED Triage Vitals [09/21/20 0011]  Enc Vitals Group     BP 126/88     Pulse Rate 83     Resp 18     Temp 98.4 F (36.9 C)     Temp Source Oral     SpO2 97 %     Weight 264 lb (119.7 kg)     Height 5' 1.5" (1.562 m)     Head Circumference      Peak Flow      Pain Score 7     Pain Loc      Pain Edu?      Excl. in GC?     Constitutional: Alert and oriented. Well appearing and in no acute distress. Eyes: Conjunctivae are normal. PERRL. EOMI. Head: Atraumatic. Nose: No congestion/rhinnorhea. Mouth/Throat: Mucous membranes are moist.   Neck: No stridor.   Cardiovascular: Normal rate, regular rhythm. Grossly normal heart sounds.  Good peripheral circulation. Respiratory: Normal respiratory effort.  No retractions. Lungs CTAB. Gastrointestinal: Soft and nontender. No distention. No abdominal bruits. No CVA tenderness. Musculoskeletal:  Right middle digit with swelling at finger pad.  Nail and nailbed intact.  No subungual hematoma. No lower extremity tenderness nor edema.  No joint effusions. Neurologic:  Normal speech and language. No gross focal neurologic  deficits are appreciated. No gait instability. Skin:  Skin is warm, dry and intact. No rash noted. Psychiatric: Mood and affect are normal. Speech and behavior are normal.  ____________________________________________   LABS (all labs ordered are listed, but only abnormal results are displayed)  Labs Reviewed - No data to display ____________________________________________  EKG  None ____________________________________________  RADIOLOGY I, Robynne Roat J, personally viewed and evaluated these images (plain radiographs) as part of my medical decision making, as well as reviewing the written report by the radiologist.  ED MD interpretation: Tuft fracture  Official radiology report(s): DG Finger Middle Right  Result Date: 09/21/2020 CLINICAL DATA:  Pain EXAM: RIGHT MIDDLE FINGER 2+V COMPARISON:  June 20, 2020 FINDINGS: There is an acute, nondisplaced fracture through  the tuft of the distal phalanx of the third digit. There is surrounding soft tissue swelling without evidence for a dislocation. IMPRESSION: Acute, nondisplaced fracture through the tuft of the distal phalanx of the third digit. Electronically Signed   By: Katherine Mantle M.D.   On: 09/21/2020 00:51    ____________________________________________   PROCEDURES  Procedure(s) performed (including Critical Care):  Procedures   ____________________________________________   INITIAL IMPRESSION / ASSESSMENT AND PLAN / ED COURSE  As part of my medical decision making, I reviewed the following data within the electronic MEDICAL RECORD NUMBER Nursing notes reviewed and incorporated, Old chart reviewed, Radiograph reviewed and Notes from prior ED visits     34 year old female presenting with right middle digit injury.  Tuft fracture noted on x-ray.  Patient was recently seen in the ED for right ankle injury and prescribed Percocet 7.5 mg.  Advised her to also take ibuprofen and continue ice to reduce swelling.  Aluminum  finger splint applied.  Strict return precautions given.  Patient verbalizes understanding and agrees with plan of care.      ____________________________________________   FINAL CLINICAL IMPRESSION(S) / ED DIAGNOSES  Final diagnoses:  Closed fracture of tuft of distal phalanx of finger     ED Discharge Orders    None      *Please note:  Gina Farmer was evaluated in Emergency Department on 09/21/2020 for the symptoms described in the history of present illness. She was evaluated in the context of the global COVID-19 pandemic, which necessitated consideration that the patient might be at risk for infection with the SARS-CoV-2 virus that causes COVID-19. Institutional protocols and algorithms that pertain to the evaluation of patients at risk for COVID-19 are in a state of rapid change based on information released by regulatory bodies including the CDC and federal and state organizations. These policies and algorithms were followed during the patient's care in the ED.  Some ED evaluations and interventions may be delayed as a result of limited staffing during and the pandemic.*   Note:  This document was prepared using Dragon voice recognition software and may include unintentional dictation errors.   Irean Hong, MD 09/21/20 [id number]

## 2021-01-01 NOTE — Telephone Encounter (Signed)
Patient is scheduled for 01/06/21 with AMS

## 2021-01-06 ENCOUNTER — Ambulatory Visit (INDEPENDENT_AMBULATORY_CARE_PROVIDER_SITE_OTHER): Payer: BC Managed Care – PPO | Admitting: Obstetrics and Gynecology

## 2021-01-06 ENCOUNTER — Other Ambulatory Visit: Payer: Self-pay

## 2021-01-06 ENCOUNTER — Encounter: Payer: Self-pay | Admitting: Obstetrics and Gynecology

## 2021-01-06 VITALS — BP 138/86 | Ht 61.0 in | Wt 290.0 lb

## 2021-01-06 DIAGNOSIS — R102 Pelvic and perineal pain unspecified side: Secondary | ICD-10-CM

## 2021-01-06 DIAGNOSIS — N941 Unspecified dyspareunia: Secondary | ICD-10-CM

## 2021-01-06 NOTE — Progress Notes (Signed)
Obstetrics & Gynecology Office Visit   Chief Complaint: No chief complaint on file.   History of Present Illness: 35 y.o. with previously diagnosed stage I endometriosis status post BSO.  She reports some improvement in overall pain but continues to report pain with intercourse.  She reports the pain is midline.  No association with voiding or bowl movements.  The pain is described as sharp stabbing.  No radiation.  No vaginal bleeding (surgically menopausal).  She was started on HRT postoperatively but has self discontinued this and has not noted significant vasomotor symptoms.  She is interested in a hysterectomy as she is of the opinion that the pain is from her uterus.   Review of Systems:  Review of Systems  Constitutional: Negative.   Gastrointestinal: Positive for abdominal pain. Negative for constipation, diarrhea, nausea and vomiting.  Genitourinary: Negative.      Past Medical History:  Past Medical History:  Diagnosis Date  . Anxiety   . Back injury   . Bipolar 1 disorder (HCC)   . Bipolar 2 disorder (HCC)   . Depression   . Family history of ovarian cancer    2/21 cancer genetic testing letter sent  . GERD (gastroesophageal reflux disease)   . Increased pressure in the eye, left   . Morbid obesity (HCC)   . Post traumatic stress disorder (PTSD)   . PTSD (post-traumatic stress disorder)     Past Surgical History:  Past Surgical History:  Procedure Laterality Date  . APPENDECTOMY  2009  . INTRAOCULAR LENS IMPLANT, SECONDARY     from eye injury  . LAPAROSCOPIC BILATERAL SALPINGO OOPHERECTOMY Bilateral 07/25/2020   Procedure: LAPAROSCOPIC BILATERAL SALPINGO OOPHORECTOMY;  Surgeon: Vena Austria, MD;  Location: [facility] ORS;  Service: Gynecology;  Laterality: Bilateral;  . LAPAROSCOPY  2003  . LAPAROSCOPY N/A 05/21/2020   Procedure: LAPAROSCOPY DIAGNOSTIC;  Surgeon: Vena Austria, MD;  Location: [facility] ORS;  Service: Gynecology;  Laterality: N/A;  .  SACROILIAC JOINT INJECTION      Gynecologic History: Patient's last menstrual period was 11/05/2019.  Obstetric History: G2P0020  Family History:  Family History  Problem Relation Age of Onset  . Hypertension Father   . Heart disease Father   . Narcolepsy Father   . Obesity Father   . Hypertension Brother   . Obesity Maternal Aunt   . Ovarian cancer Maternal Aunt 40  . Obesity Paternal Aunt   . Heart disease Maternal Grandfather   . Ovarian cancer Paternal Grandmother        41s  . Uterine cancer Other     Social History:  Social History   Socioeconomic History  . Marital status: Single    Spouse name: Not on file  . Number of children: Not on file  . Years of education: Not on file  . Highest education level: Not on file  Occupational History  . Not on file  Tobacco Use  . Smoking status: Former Smoker    Packs/day: 0.25    Types: Cigarettes    Quit date: 12/19/2020    Years since quitting: 0.0  . Smokeless tobacco: Never Used  Vaping Use  . Vaping Use: Some days  . Last attempt to quit: 05/14/2018  Substance and Sexual Activity  . Alcohol use: Not Currently    Comment: social  . Drug use: Yes    Types: Marijuana    Comment: occ  . Sexual activity: Yes    Birth control/protection: Surgical  Other Topics Concern  .  Not on file  Social History Narrative  . Not on file   Social Determinants of Health   Financial Resource Strain: Not on file  Food Insecurity: Not on file  Transportation Needs: Not on file  Physical Activity: Not on file  Stress: Not on file  Social Connections: Not on file  Intimate Partner Violence: Not on file    Allergies:  Allergies  Allergen Reactions  . Trileptal [Oxcarbazepine] Swelling    Per pt, "leg swelling"  . Viibryd [Vilazodone Hcl] Swelling    Per pt "leg swelling"    Medications: Prior to Admission medications   Medication Sig Start Date End Date Taking? Authorizing Provider  estradiol (ESTRACE) 1 MG tablet  Take 1 tablet (1 mg total) by mouth daily. 07/25/20 07/25/21 Yes Vena Austria, MD  HYDROcodone-acetaminophen (NORCO/VICODIN) 5-325 MG tablet Take 1 tablet by mouth every 6 (six) hours as needed. 08/02/20  Yes Vena Austria, MD  hydrOXYzine (VISTARIL) 25 MG capsule Take 25 mg by mouth 3 (three) times daily.    Yes [provider]  ibuprofen (ADVIL) 600 MG tablet Take 1 tablet (600 mg total) by mouth every 6 (six) hours as needed for mild pain or cramping. 08/02/20  Yes Vena Austria, MD  methylPREDNISolone (MEDROL DOSEPAK) 4 MG TBPK tablet Take by mouth. 01/02/21  Yes [provider]  ondansetron (ZOFRAN ODT) 4 MG disintegrating tablet Take 1 tablet (4 mg total) by mouth every 6 (six) hours as needed for nausea. 09/03/20  Yes Vena Austria, MD  progesterone (PROMETRIUM) 100 MG capsule Take 1 capsule (100 mg total) by mouth daily. 07/25/20  Yes Vena Austria, MD  TIMOPTIC 0.5 % ophthalmic solution Place 1 drop into the left eye 2 (two) times daily. 03/25/20  Yes [provider]  XALATAN 0.005 % ophthalmic solution Place 1 drop into the left eye at bedtime.  03/25/20  Yes [provider]  indomethacin (INDOCIN) 50 MG capsule Take 1 capsule (50 mg total) by mouth 3 (three) times daily as needed. Patient not taking: Reported on 01/06/2021 09/17/20   Joni Reining, PA-C  oxyCODONE-acetaminophen (PERCOCET) 7.5-325 MG tablet Take 1 tablet by mouth every 6 (six) hours as needed. Patient not taking: Reported on 01/06/2021 09/17/20   Joni Reining, PA-C    Physical Exam Vitals:  Vitals:   01/06/21 1459  BP: 138/86   Patient's last menstrual period was 11/05/2019.  General: NAD HEENT: normocephalic, anicteric Pulmonary: No increased work of breathing Neurologic: Grossly intact Psychiatric: mood appropriate, affect full  Female chaperone present for pelvic  portions of the physical exam  Assessment: 35 y.o. G2P0020 with stage I endometriosis s/p BSO,  chronic pelvic pain, dysparunia  Plan: Problem List Items Addressed This Visit   None   Visit Diagnoses    Dyspareunia, female    -  Primary   Relevant Orders   Ambulatory referral to Physical Therapy   Pelvic pain       Relevant Orders   Ambulatory referral to Physical Therapy      1) Dysparunia and pelvic pain - stage I endometriosis now status post BSO.  We discussed at this point I do not have a gyn etiology for her pain and I would no anticipate improvement in pain with hysterectomy.  I would recommend trial of pelvic floor PT  2) A total of 15 minutes were spent in face-to-face contact with the patient during this encounter with over half of that time devoted to counseling and coordination of care.  3) Return in about 6 months (around 07/06/2021) for annual.    Vena Austria, MD, [facility], [facility] 01/06/2021, 3:47 PM

## 2021-02-04 NOTE — Telephone Encounter (Signed)
Patient had lap hysterectomy bilat salpingoopherectomy.

## 2021-05-16 ENCOUNTER — Encounter: Payer: Self-pay | Admitting: Emergency Medicine

## 2021-05-16 ENCOUNTER — Ambulatory Visit
Admission: EM | Admit: 2021-05-16 | Discharge: 2021-05-16 | Disposition: A | Discharge status: Home / Self Care | Payer: BC Managed Care – PPO

## 2021-05-16 ENCOUNTER — Other Ambulatory Visit: Payer: Self-pay

## 2021-05-16 DIAGNOSIS — L739 Follicular disorder, unspecified: Secondary | ICD-10-CM | POA: Diagnosis not present

## 2021-05-16 MED ORDER — CEPHALEXIN 500 MG PO CAPS: 500.0000 mg | ORAL_CAPSULE | Freq: Four times a day (QID) | ORAL | 0 refills | Status: DC

## 2021-05-16 NOTE — Discharge Instructions (Signed)
Take the antibiotic as directed.    Follow-up with your primary care provider or a dermatologist if your symptoms are not improving.

## 2021-05-16 NOTE — ED Triage Notes (Signed)
Patient c/o abscess x 3 weeks.   Patient endorses symptoms have progressively become worst.   Patient endorses the area affected is RT inner thigh and labia. Patient endorses 3 abscess.   Patient endorses " I got some pus out of one of the areas".   Patient endorses more pain upon ambulation.   Patient has used Aquaphor with some relief of chaffing. Patient has use an OTC " pain relief cream" with some relief.  Patient has " tried to keep the area cream".

## 2021-05-16 NOTE — ED Provider Notes (Addendum)
Gina Farmer    CSN: [id number] Arrival date & time: 05/16/21  7035      History   Chief Complaint Chief Complaint  Patient presents with  . Abscess    HPI Gina Farmer is a 35 y.o. female.   Patient presents with 3-week history of several "abscesses" on her groin.  Some have drained pus.  She denies fever, chills, or other symptoms.  Treatment at home with Aquaphor lotion.  Her medical history includes bipolar disorder, PTSD, anxiety, depression, morbid obesity, back pain.  The history is provided by the patient and medical records.    Past Medical History:  Diagnosis Date  . Anxiety   . Back injury   . Bipolar 1 disorder (HCC)   . Bipolar 2 disorder (HCC)   . Depression   . Family history of ovarian cancer    2/21 cancer genetic testing letter sent  . GERD (gastroesophageal reflux disease)   . Increased pressure in the eye, left   . Morbid obesity (HCC)   . Post traumatic stress disorder (PTSD)   . PTSD (post-traumatic stress disorder)     Patient Active Problem List   Diagnosis Date Noted  . Endometriosis determined by laparoscopy 05/31/2020  . Trichomoniasis 12/12/2019    Past Surgical History:  Procedure Laterality Date  . APPENDECTOMY  2009  . INTRAOCULAR LENS IMPLANT, SECONDARY     from eye injury  . LAPAROSCOPIC BILATERAL SALPINGO OOPHERECTOMY Bilateral 07/25/2020   Procedure: LAPAROSCOPIC BILATERAL SALPINGO OOPHORECTOMY;  Surgeon: Vena Austria, MD;  Location: [facility] ORS;  Service: Gynecology;  Laterality: Bilateral;  . LAPAROSCOPY  2003  . LAPAROSCOPY N/A 05/21/2020   Procedure: LAPAROSCOPY DIAGNOSTIC;  Surgeon: Vena Austria, MD;  Location: [facility] ORS;  Service: Gynecology;  Laterality: N/A;  . SACROILIAC JOINT INJECTION      OB History    Gravida  2   Para      Term      Preterm      AB  2   Living        SAB  1   IAB  1   Ectopic      Multiple      Live Births               Home Medications    Prior to  Admission medications   Medication Sig Start Date End Date Taking? Authorizing Provider  cephALEXin (KEFLEX) 500 MG capsule Take 1 capsule (500 mg total) by mouth 4 (four) times daily. 05/16/21  Yes Mickie Bail, NP  hydroxychloroquine (PLAQUENIL) 200 MG tablet Take 1 tablet by mouth 2 (two) times daily. 03/17/21  Yes [provider]  estradiol (ESTRACE) 1 MG tablet Take 1 tablet (1 mg total) by mouth daily. 07/25/20 07/25/21  Vena Austria, MD  HYDROcodone-acetaminophen (NORCO/VICODIN) 5-325 MG tablet Take 1 tablet by mouth every 6 (six) hours as needed. 08/02/20   Vena Austria, MD  hydrOXYzine (VISTARIL) 25 MG capsule Take 25 mg by mouth 3 (three) times daily.     [provider]  ibuprofen (ADVIL) 600 MG tablet Take 1 tablet (600 mg total) by mouth every 6 (six) hours as needed for mild pain or cramping. 08/02/20   Vena Austria, MD  indomethacin (INDOCIN) 50 MG capsule Take 1 capsule (50 mg total) by mouth 3 (three) times daily as needed. Patient not taking: No sig reported 09/17/20   Joni Reining, PA-C  methylPREDNISolone (MEDROL DOSEPAK) 4 MG TBPK tablet Take by  mouth. 01/02/21   [provider]  ondansetron (ZOFRAN ODT) 4 MG disintegrating tablet Take 1 tablet (4 mg total) by mouth every 6 (six) hours as needed for nausea. 09/03/20   Vena Austria, MD  oxyCODONE-acetaminophen (PERCOCET) 7.5-325 MG tablet Take 1 tablet by mouth every 6 (six) hours as needed. Patient not taking: No sig reported 09/17/20   Joni Reining, PA-C  progesterone (PROMETRIUM) 100 MG capsule Take 1 capsule (100 mg total) by mouth daily. 07/25/20   Vena Austria, MD  TIMOPTIC 0.5 % ophthalmic solution Place 1 drop into the left eye 2 (two) times daily. 03/25/20   [provider]  XALATAN 0.005 % ophthalmic solution Place 1 drop into the left eye at bedtime.  03/25/20   [provider]    Family History Family History  Problem Relation Age of Onset  .  Hypertension Father   . Heart disease Father   . Narcolepsy Father   . Obesity Father   . Hypertension Brother   . Obesity Maternal Aunt   . Ovarian cancer Maternal Aunt 40  . Obesity Paternal Aunt   . Heart disease Maternal Grandfather   . Ovarian cancer Paternal Grandmother        54s  . Uterine cancer Other     Social History Social History   Tobacco Use  . Smoking status: Former Smoker    Packs/day: 0.25    Types: Cigarettes    Quit date: 12/19/2020    Years since quitting: 0.4  . Smokeless tobacco: Never Used  Vaping Use  . Vaping Use: Some days  . Last attempt to quit: 05/14/2018  Substance Use Topics  . Alcohol use: Not Currently    Comment: social  . Drug use: Yes    Types: Marijuana    Comment: occ     Allergies   Trileptal [oxcarbazepine] and Viibryd [vilazodone hcl]   Review of Systems Review of Systems  Constitutional: Negative for chills and fever.  Respiratory: Negative for cough and shortness of breath.   Cardiovascular: Negative for chest pain and palpitations.  Gastrointestinal: Negative for abdominal pain and vomiting.  Skin: Positive for wound. Negative for color change.  All other systems reviewed and are negative.    Physical Exam Triage Vital Signs ED Triage Vitals  Enc Vitals Group     BP      Pulse      Resp      Temp      Temp src      SpO2      Weight      Height      Head Circumference      Peak Flow      Pain Score      Pain Loc      Pain Edu?      Excl. in GC?    No data found.  Updated Vital Signs BP (!) 142/84 (BP Location: Left Arm)   Pulse 74   Temp 99.4 F (37.4 C) (Oral)   Resp 14   LMP 11/05/2019   SpO2 96%   Visual Acuity Right Eye Distance:   Left Eye Distance:   Bilateral Distance:    Right Eye Near:   Left Eye Near:    Bilateral Near:     Physical Exam Vitals and nursing note reviewed.  Constitutional:      General: She is not in acute distress.    Appearance: She is well-developed. She  is obese.  HENT:  Head: Normocephalic and atraumatic.     Mouth/Throat:     Mouth: Mucous membranes are moist.  Eyes:     Conjunctiva/sclera: Conjunctivae normal.  Cardiovascular:     Rate and Rhythm: Normal rate and regular rhythm.     Heart sounds: Normal heart sounds.  Pulmonary:     Effort: Pulmonary effort is normal. No respiratory distress.     Breath sounds: Normal breath sounds.  Abdominal:     Palpations: Abdomen is soft.     Tenderness: There is no abdominal tenderness.  Musculoskeletal:     Cervical back: Neck supple.  Skin:    General: Skin is warm and dry.     Findings: Lesion present.     Comments: Groin shaved.  4 small areas of firm induration with open healing lesions.  Mild localized erythema at each site.  No drainage.  Neurological:     General: No focal deficit present.     Mental Status: She is alert and oriented to person, place, and time.     Gait: Gait normal.  Psychiatric:        Mood and Affect: Mood normal.        Behavior: Behavior normal.      UC Treatments / Results  Labs (all labs ordered are listed, but only abnormal results are displayed) Labs Reviewed - No data to display  EKG   Radiology No results found.  Procedures Procedures (including critical care time)  Medications Ordered in UC Medications - No data to display  Initial Impression / Assessment and Plan / UC Course  I have reviewed the triage vital signs and the nursing notes.  Pertinent labs & imaging results that were available during my care of the patient were reviewed by me and considered in my medical decision making (see chart for details).   Folliculitis.  No indication for I&D today as the areas are already open and healing.  Treating with Keflex.  Discussed with patient that these areas of folliculitis are likely caused by shaving.  Education provided.  Instructed her to follow-up with her PCP or dermatologist if the symptoms are not improving.  She agrees to  plan of care.   Final Clinical Impressions(s) / UC Diagnoses   Final diagnoses:  Folliculitis     Discharge Instructions     Take the antibiotic as directed.    Follow-up with your primary care provider or a dermatologist if your symptoms are not improving.    ED Prescriptions    Medication Sig Dispense Auth. Provider   cephALEXin (KEFLEX) 500 MG capsule Take 1 capsule (500 mg total) by mouth 4 (four) times daily. 28 capsule Mickie Bail, NP     PDMP not reviewed this encounter.   Mickie Bail, NP 05/16/21 1008    Mickie Bail, NP 05/16/21 1011

## 2021-06-02 ENCOUNTER — Other Ambulatory Visit: Payer: Self-pay | Admitting: Obstetrics and Gynecology

## 2021-06-02 MED ORDER — ONDANSETRON 4 MG PO TBDP
4.0000 mg | ORAL_TABLET | Freq: Four times a day (QID) | ORAL | 0 refills | Status: DC | PRN
Start: 2021-06-02 — End: 2021-09-05

## 2021-06-06 ENCOUNTER — Emergency Department
Admission: EM | Admit: 2021-06-06 | Discharge: 2021-06-06 | Disposition: A | Discharge status: Home / Self Care | Payer: BC Managed Care – PPO | Attending: Emergency Medicine | Admitting: Emergency Medicine

## 2021-06-06 ENCOUNTER — Encounter: Payer: Self-pay | Admitting: Emergency Medicine

## 2021-06-06 ENCOUNTER — Other Ambulatory Visit: Payer: Self-pay

## 2021-06-06 ENCOUNTER — Emergency Department: Payer: BC Managed Care – PPO

## 2021-06-06 DIAGNOSIS — R0602 Shortness of breath: Secondary | ICD-10-CM | POA: Diagnosis not present

## 2021-06-06 DIAGNOSIS — R079 Chest pain, unspecified: Secondary | ICD-10-CM

## 2021-06-06 DIAGNOSIS — Z87891 Personal history of nicotine dependence: Secondary | ICD-10-CM | POA: Diagnosis not present

## 2021-06-06 DIAGNOSIS — R059 Cough, unspecified: Secondary | ICD-10-CM | POA: Insufficient documentation

## 2021-06-06 DIAGNOSIS — Z20822 Contact with and (suspected) exposure to covid-19: Secondary | ICD-10-CM | POA: Insufficient documentation

## 2021-06-06 DIAGNOSIS — R0789 Other chest pain: Secondary | ICD-10-CM | POA: Diagnosis not present

## 2021-06-06 DIAGNOSIS — R531 Weakness: Secondary | ICD-10-CM | POA: Diagnosis not present

## 2021-06-06 DIAGNOSIS — R2243 Localized swelling, mass and lump, lower limb, bilateral: Secondary | ICD-10-CM | POA: Insufficient documentation

## 2021-06-06 DIAGNOSIS — Z79899 Other long term (current) drug therapy: Secondary | ICD-10-CM | POA: Diagnosis not present

## 2021-06-06 LAB — URINALYSIS, COMPLETE (UACMP) WITH MICROSCOPIC
Bilirubin Urine: NEGATIVE
Glucose, UA: NEGATIVE mg/dL
Ketones, ur: NEGATIVE mg/dL
Nitrite: POSITIVE — AB
Protein, ur: NEGATIVE mg/dL
Specific Gravity, Urine: 1.024 (ref 1.005–1.030)
pH: 6 (ref 5.0–8.0)

## 2021-06-06 LAB — CBC WITH DIFFERENTIAL/PLATELET
Abs Immature Granulocytes: 0.06 10*3/uL (ref 0.00–0.07)
Basophils Absolute: 0.1 10*3/uL (ref 0.0–0.1)
Basophils Relative: 1 %
Eosinophils Absolute: 0.4 10*3/uL (ref 0.0–0.5)
Eosinophils Relative: 3 %
HCT: 41.2 % (ref 36.0–46.0)
Hemoglobin: 13.6 g/dL (ref 12.0–15.0)
Immature Granulocytes: 0 %
Lymphocytes Relative: 31 %
Lymphs Abs: 4.5 10*3/uL — ABNORMAL HIGH (ref 0.7–4.0)
MCH: 28.9 pg (ref 26.0–34.0)
MCHC: 33 g/dL (ref 30.0–36.0)
MCV: 87.7 fL (ref 80.0–100.0)
Monocytes Absolute: 0.7 10*3/uL (ref 0.1–1.0)
Monocytes Relative: 5 %
Neutro Abs: 8.8 10*3/uL — ABNORMAL HIGH (ref 1.7–7.7)
Neutrophils Relative %: 60 %
Platelets: 267 10*3/uL (ref 150–400)
RBC: 4.7 MIL/uL (ref 3.87–5.11)
RDW: 13.1 % (ref 11.5–15.5)
WBC: 14.6 10*3/uL — ABNORMAL HIGH (ref 4.0–10.5)
nRBC: 0 % (ref 0.0–0.2)

## 2021-06-06 LAB — BASIC METABOLIC PANEL
Anion gap: 7 (ref 5–15)
BUN: 22 mg/dL — ABNORMAL HIGH (ref 6–20)
CO2: 29 mmol/L (ref 22–32)
Calcium: 9 mg/dL (ref 8.9–10.3)
Chloride: 107 mmol/L (ref 98–111)
Creatinine, Ser: 0.79 mg/dL (ref 0.44–1.00)
GFR, Estimated: >60 mL/min (ref >60)
Glucose, Bld: 93 mg/dL (ref 70–99)
Potassium: 3.8 mmol/L (ref 3.5–5.1)
Sodium: 143 mmol/L (ref 135–145)

## 2021-06-06 LAB — D-DIMER, QUANTITATIVE: D-Dimer, Quant: <0.27 ug/mL-FEU (ref 0.00–0.50)

## 2021-06-06 LAB — RESP PANEL BY RT-PCR (FLU A&B, COVID) ARPGX2
Influenza A by PCR: NEGATIVE
Influenza B by PCR: NEGATIVE
SARS Coronavirus 2 by RT PCR: NEGATIVE

## 2021-06-06 LAB — TROPONIN I (HIGH SENSITIVITY)
Troponin I (High Sensitivity): 5 ng/L (ref <18)
Troponin I (High Sensitivity): 3 ng/L (ref <18)

## 2021-06-06 NOTE — Discharge Instructions (Addendum)
Please call the cardiology office, Dr. Okey Dupre for follow-up.  Your work-up today does not show any signs of a blood clot or a heart attack however given your risk factors you can further discuss this with them.  Try to elevate your legs and use compression stockings to see if this helps.  If it is not then you may need to have an echocardiogram done with cardiology.  Return to the ER for worsening shortness of breath, chest pain, or any other concerns

## 2021-06-06 NOTE — ED Provider Notes (Signed)
[facility] Emergency Department Provider Note   ____________________________________________   Event Date/Time   First MD Initiated Contact with Patient 06/06/21 [phone]     (approximate)  I have reviewed the triage vital signs and the nursing notes.   HISTORY  Chief Complaint Chest Pain    HPI Gina Farmer is a 35 y.o. female with past medical history of GERD, undifferentiated inflammatory arthritis, PTSD, and bipolar disorder who presents to the ED complaining of chest pain.  Patient reports that she has been feeling generally weak for the past 2 to 3 days, has subsequently developed heaviness in the center of her chest.  She states that the pain in her chest is worse with a deep breath or when she coughs.  She additionally complains of diffuse muscle aches in her extremities associated with swelling in her legs.  She states she deals with swelling in her legs sometimes due to her arthritis but that it has been worse than usual recently.  She complains of a dry cough with mild shortness of breath, denies any abdominal pain, nausea, vomiting, diarrhea, dysuria, or hematuria.  She is not aware of any sick contacts and is fully vaccinated against COVID-19.        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, left    Morbid obesity (HCC)    Post traumatic stress disorder (PTSD)    PTSD (post-traumatic stress disorder)     Patient Active Problem List   Diagnosis Date Noted   Endometriosis determined by laparoscopy 05/31/2020   Trichomoniasis 12/12/2019    Past Surgical History:  Procedure Laterality Date   APPENDECTOMY  2009   INTRAOCULAR LENS IMPLANT, SECONDARY     from eye injury   LAPAROSCOPIC  BILATERAL SALPINGO OOPHERECTOMY Bilateral 07/25/2020   Procedure: LAPAROSCOPIC BILATERAL SALPINGO OOPHORECTOMY;  Surgeon: Vena Austria, MD;  Location: [facility] ORS;  Service: Gynecology;  Laterality: Bilateral;   LAPAROSCOPY  2003   LAPAROSCOPY N/A 05/21/2020   Procedure: LAPAROSCOPY DIAGNOSTIC;  Surgeon: Vena Austria, MD;  Location: [facility] ORS;  Service: Gynecology;  Laterality: N/A;   SACROILIAC JOINT INJECTION      Prior to Admission medications   Medication Sig Start Date End Date Taking? Authorizing Provider  cephALEXin (KEFLEX) 500 MG capsule Take 1 capsule (500 mg total) by mouth 4 (four) times daily. 05/16/21   Mickie Bail, NP  estradiol (ESTRACE) 1 MG tablet Take 1 tablet (1 mg total) by mouth daily. 07/25/20 07/25/21  Vena Austria, MD  HYDROcodone-acetaminophen (NORCO/VICODIN) 5-325 MG tablet Take 1 tablet by mouth every 6 (six) hours as needed. 08/02/20   Vena Austria, MD  hydroxychloroquine (PLAQUENIL) 200 MG tablet Take 1 tablet by mouth 2 (two) times daily. 03/17/21   [provider]  hydrOXYzine (VISTARIL) 25 MG capsule Take 25 mg by mouth 3 (three) times daily.     [provider]  ibuprofen (ADVIL) 600 MG tablet Take 1 tablet (600 mg total) by mouth every 6 (six) hours as needed for mild pain or cramping. 08/02/20   Vena Austria, MD  indomethacin (INDOCIN) 50 MG capsule Take 1 capsule (50 mg total) by mouth 3 (three) times daily  as needed. Patient not taking: No sig reported 09/17/20   Joni Reining, PA-C  methylPREDNISolone (MEDROL DOSEPAK) 4 MG TBPK tablet Take by mouth. 01/02/21   [provider]  ondansetron (ZOFRAN ODT) 4 MG disintegrating tablet Take 1 tablet (4 mg total) by mouth every 6 (six) hours as needed for nausea. 06/02/21   Vena Austria, MD  oxyCODONE-acetaminophen (PERCOCET) 7.5-325 MG tablet Take 1 tablet by mouth every 6 (six) hours as needed. Patient not taking: No sig reported 09/17/20   Joni Reining, PA-C   progesterone (PROMETRIUM) 100 MG capsule Take 1 capsule (100 mg total) by mouth daily. 07/25/20   Vena Austria, MD  TIMOPTIC 0.5 % ophthalmic solution Place 1 drop into the left eye 2 (two) times daily. 03/25/20   [provider]  XALATAN 0.005 % ophthalmic solution Place 1 drop into the left eye at bedtime.  03/25/20   [provider]    Allergies Trileptal [oxcarbazepine] and Elliot Cousin hcl]  Family History  Problem Relation Age of Onset   Hypertension Father    Heart disease Father    Narcolepsy Father    Obesity Father    Hypertension Brother    Obesity Maternal Aunt    Ovarian cancer Maternal Aunt 40   Obesity Paternal Aunt    Heart disease Maternal Grandfather    Ovarian cancer Paternal Grandmother        72s   Uterine cancer Other     Social History Social History   Tobacco Use   Smoking status: Former    Packs/day: 0.25    Pack years: 0.00    Types: Cigarettes    Quit date: 12/19/2020    Years since quitting: 0.4   Smokeless tobacco: Never  Vaping Use   Vaping Use: Some days   Last attempt to quit: 05/14/2018  Substance Use Topics   Alcohol use: Not Currently    Comment: social   Drug use: Yes    Types: Marijuana    Comment: occ    Review of Systems  Constitutional: No fever/chills.  Positive for generalized weakness and fatigue. Eyes: No visual changes. ENT: No sore throat. Cardiovascular: Positive for chest pain. Respiratory: Positive for cough and shortness of breath. Gastrointestinal: No abdominal pain.  No nausea, no vomiting.  No diarrhea.  No constipation. Genitourinary: Negative for dysuria. Musculoskeletal: Negative for back pain.  Positive for lower extremity pain and swelling. Skin: Negative for rash. Neurological: Negative for headaches, focal weakness or numbness.  ____________________________________________   PHYSICAL EXAM:  VITAL SIGNS: ED Triage Vitals  Enc Vitals Group     BP 06/06/21 0407 (!)  157/98     Pulse Rate 06/06/21 0407 88     Resp 06/06/21 0407 18     Temp 06/06/21 0407 98.3 F (36.8 C)     Temp Source 06/06/21 0407 Oral     SpO2 06/06/21 0407 97 %     Weight 06/06/21 0410 (!) 310 lb (140.6 kg)     Height 06/06/21 0410 5\' 1"  (1.549 m)     Head Circumference --      Peak Flow --      Pain Score 06/06/21 0410 7     Pain Loc --      Pain Edu? --      Excl. in GC? --     Constitutional: Alert and oriented. Eyes: Conjunctivae are normal. Head: Atraumatic. Nose: No congestion/rhinnorhea. Mouth/Throat: Mucous membranes are moist. Neck: Normal ROM Cardiovascular: Normal rate,  regular rhythm. Grossly normal heart sounds.  2+ radial and DP pulses bilaterally. Respiratory: Normal respiratory effort.  No retractions. Lungs CTAB.  No chest wall tenderness to palpation. Gastrointestinal: Soft and nontender. No distention. Genitourinary: deferred Musculoskeletal: Trace pitting edema to knees bilaterally with bilateral calf tenderness. Neurologic:  Normal speech and language. No gross focal neurologic deficits are appreciated. Skin:  Skin is warm, dry and intact. No rash noted. Psychiatric: Mood and affect are normal. Speech and behavior are normal.  ____________________________________________   LABS (all labs ordered are listed, but only abnormal results are displayed)  Labs Reviewed  BASIC METABOLIC PANEL - Abnormal; Notable for the following components:      Result Value   BUN 22 (*)    All other components within normal limits  URINALYSIS, COMPLETE (UACMP) WITH MICROSCOPIC - Abnormal; Notable for the following components:   Color, Urine YELLOW (*)    APPearance HAZY (*)    Hgb urine dipstick MODERATE (*)    Nitrite POSITIVE (*)    Leukocytes,Ua SMALL (*)    Bacteria, UA RARE (*)    All other components within normal limits  CBC WITH DIFFERENTIAL/PLATELET - Abnormal; Notable for the following components:   WBC 14.6 (*)    Neutro Abs 8.8 (*)    Lymphs Abs  4.5 (*)    All other components within normal limits  RESP PANEL BY RT-PCR (FLU A&B, COVID) ARPGX2  URINE CULTURE  D-DIMER, QUANTITATIVE  TROPONIN I (HIGH SENSITIVITY)  TROPONIN I (HIGH SENSITIVITY)   ____________________________________________  EKG  ED ECG REPORT I, Chesley Noon, the attending physician, personally viewed and interpreted this ECG.   Date: 06/06/2021  EKG Time: 4:16  Rate: 75  Rhythm: normal sinus rhythm  Axis: Normal  Intervals:none  ST&T Change: None   PROCEDURES  Procedure(s) performed (including Critical Care):  Procedures   ____________________________________________   INITIAL IMPRESSION / ASSESSMENT AND PLAN / ED COURSE      35 year old female with past medical history of GERD, undifferentiated arthritis, PTSD, and bipolar disorder who presents to the ED for generalized weakness, fatigue, chest pain, shortness of breath, and leg swelling.  Patient is not in any respiratory distress and is maintaining O2 sats on room air.  EKG shows no evidence of arrhythmia or ischemia and initial troponin is within normal limits.  She has a mild amount of swelling to her bilateral lower extremities but is neurovascularly intact to both lower extremities.  We will further assess with D-dimer to rule out DVT or PE given she is moderate risk by [place].  Chest x-ray reviewed by me and shows no infiltrate, edema, or effusion.  Additional labs are reassuring, I would also consider viral illness as the etiology for her symptoms.  We will perform testing for flu and COVID-19, reassess following additional results.  Patient turned over to oncoming provider pending additional results and reassessment.     ____________________________________________   FINAL CLINICAL IMPRESSION(S) / ED DIAGNOSES  Final diagnoses:  Nonspecific chest pain  Generalized weakness     ED Discharge Orders     None        Note:  This document was prepared using Dragon voice  recognition software and may include unintentional dictation errors.    Chesley Noon, MD 06/06/21 [phone]

## 2021-06-06 NOTE — ED Triage Notes (Signed)
Patient ambulatory to triage with steady gait, without difficulty or distress noted; pt reports had epidural on Monday for back pain; pt with multiple c/o--nausea, leg pain, mid CP, SHOB and "not feeling right", numbness to hands & feet

## 2021-06-06 NOTE — ED Provider Notes (Signed)
9:12 AM Assumed care for off going team.   Blood pressure 117/72, pulse 63, temperature 98.3 F (36.8 C), temperature source Oral, resp. rate 20, height 5\' 1"  (1.549 m), weight (!) 140.6 kg, last menstrual period 11/05/2019, SpO2 98 %, unknown if currently breastfeeding.  See their HPI for full report but in brief pt with Arthritis, with generalized weakness, leg swelling, chest heaviness, sob--repeat trop/ddimer -if positive 11/07/2019 and CT PE  and if negative dc home.   Patient's D-dimer and repeat troponin were negative.  This time I have low suspicion for pulmonary embolism or DVT.  She has very minimal swelling to her bilateral legs but one leg is not larger than the other.  We discussed elevating her legs and using compression socks.  She had recent LFTs done at [facility] that were normal and no signs of nephrotic syndrome on her UA.  No signs of heart failure on her chest x-ray.  Can have her follow-up with cardiology for further evaluation especially given she reports having a family history of a heart attack in their 30s with her parent.  She reports some fogginess but her neuro exam is intact.  Low suspicion for stroke.  Her urine does have some nitrites but I suspect that this is more likely just contamination given she has a lot of squamous cells in it.  Denies any urinary symptoms.  This was sent for culture by the primary doctor.  At this time patient feels comfortable with discharge home and will give her cardiology to follow-up with her oxygen levels are normal, blood pressure normal, afebrile and she is very well-appearing.  I discussed the provisional nature of ED diagnosis, the treatment so far, the ongoing plan of care, follow up appointments and return precautions with the patient and any family or support people present. They expressed understanding and agreed with the plan, discharged home.          [facility], MD 06/06/21 [phone]

## 2021-06-09 LAB — URINE CULTURE: Culture: >=100000 — AB

## 2021-06-10 NOTE — Consult Note (Signed)
Case discussed with Dr. Fuller Plan. Called patient and she was complaining of urinary frequency. Called in prescription for keflex 250 mg q6H x 5 days per MD.    Paschal Dopp, PharmD, BCPS

## 2021-07-14 ENCOUNTER — Other Ambulatory Visit: Payer: Self-pay

## 2021-07-14 ENCOUNTER — Ambulatory Visit (INDEPENDENT_AMBULATORY_CARE_PROVIDER_SITE_OTHER): Payer: BC Managed Care – PPO | Admitting: Obstetrics and Gynecology

## 2021-07-14 ENCOUNTER — Encounter: Payer: Self-pay | Admitting: Obstetrics and Gynecology

## 2021-07-14 ENCOUNTER — Other Ambulatory Visit (HOSPITAL_COMMUNITY)
Admission: RE | Admit: 2021-07-14 | Discharge: 2021-07-14 | Disposition: A | Discharge status: Home / Self Care | Payer: BC Managed Care – PPO | Source: Ambulatory Visit | Attending: Obstetrics and Gynecology | Admitting: Obstetrics and Gynecology

## 2021-07-14 VITALS — BP 112/68 | Ht 61.0 in | Wt 316.0 lb

## 2021-07-14 DIAGNOSIS — Z113 Encounter for screening for infections with a predominantly sexual mode of transmission: Secondary | ICD-10-CM | POA: Diagnosis present

## 2021-07-14 DIAGNOSIS — N939 Abnormal uterine and vaginal bleeding, unspecified: Secondary | ICD-10-CM

## 2021-07-14 DIAGNOSIS — N950 Postmenopausal bleeding: Secondary | ICD-10-CM | POA: Diagnosis not present

## 2021-07-14 NOTE — Progress Notes (Signed)
Gynecology H&P  Chief Complaint:  Chief Complaint  Patient presents with   Vaginal Bleeding    RM - 5    History of Present Illness: Patient is a 35 y.o. G2P0020 presents evaluation of postmenopausal bleeding. The patient states she has had a single episode(s) of bleeding in the past 1 month(s).    The bleeding has been limited to spotting and has not contained clots . She describes the blood as Bright red in appearance.  She has had no additional complaints. She deniestrauma or other inciting event and early satiety. She does not have a history of abnormal pap smears.  Her last pap smear was 01/18/2020 NILM HPV negative.  The patient was started on estrogen/progesterone HRT post oophorectomy but has not been taking this.   She has nothad prior work up for postmenopausal bleeding.  The patient has had a previous endometrial biopsy 01/18/2020 secondary to reported AUB following a missed abortion earlier that year.  Endometrial biopsy revealed benign proliferative endometrium.  Review of Systems: 10 point review of systems negative unless otherwise noted in HPI  Past Medical History:  Patient Active Problem List   Diagnosis Date Noted   Endometriosis determined by laparoscopy 05/31/2020   Trichomoniasis 12/12/2019    Treated in MAU 12/12/2019      Past Surgical History:  Past Surgical History:  Procedure Laterality Date   APPENDECTOMY  2009   INTRAOCULAR LENS IMPLANT, SECONDARY     from eye injury   LAPAROSCOPIC BILATERAL SALPINGO OOPHERECTOMY Bilateral 07/25/2020   Procedure: LAPAROSCOPIC BILATERAL SALPINGO OOPHORECTOMY;  Surgeon: Vena Austria, MD;  Location: [facility] ORS;  Service: Gynecology;  Laterality: Bilateral;   LAPAROSCOPY  2003   LAPAROSCOPY N/A 05/21/2020   Procedure: LAPAROSCOPY DIAGNOSTIC;  Surgeon: Vena Austria, MD;  Location: [facility] ORS;  Service: Gynecology;  Laterality: N/A;   SACROILIAC JOINT INJECTION      Family History:  Family History  Problem Relation  Age of Onset   Hypertension Father    Heart disease Father    Narcolepsy Father    Obesity Father    Hypertension Brother    Obesity Maternal Aunt    Ovarian cancer Maternal Aunt 40   Obesity Paternal Aunt    Heart disease Maternal Grandfather    Ovarian cancer Paternal Grandmother        1s   Uterine cancer Other     Social History:  Social History   Socioeconomic History   Marital status: Single    Spouse name: Not on file   Number of children: Not on file   Years of education: Not on file   Highest education level: Not on file  Occupational History   Not on file  Tobacco Use   Smoking status: Former    Packs/day: 0.25    Types: Cigarettes    Quit date: 12/19/2020    Years since quitting: 0.5   Smokeless tobacco: Never  Vaping Use   Vaping Use: Some days   Last attempt to quit: 05/14/2018  Substance and Sexual Activity   Alcohol use: Not Currently    Comment: social   Drug use: Yes    Types: Marijuana    Comment: occ   Sexual activity: Yes    Birth control/protection: Surgical  Other Topics Concern   Not on file  Social History Narrative   Not on file   Social Determinants of Health   Financial Resource Strain: Not on file  Food Insecurity: Not on file  Transportation Needs:  Not on file  Physical Activity: Not on file  Stress: Not on file  Social Connections: Not on file  Intimate Partner Violence: Not on file    Allergies:  Allergies  Allergen Reactions   Oxcarbazepine Swelling and Other (See Comments)    Per pt, "leg swelling" Per pt, "leg swelling" Ankle swelling    Viibryd [Vilazodone Hcl] Swelling    Per pt "leg swelling"    Medications: Prior to Admission medications   Medication Sig Start Date End Date Taking? Authorizing Provider  hydroxychloroquine (PLAQUENIL) 200 MG tablet Take 1 tablet by mouth 2 (two) times daily. 03/17/21  Yes [provider]  hydrOXYzine (VISTARIL) 25 MG capsule Take 25 mg by mouth 3 (three) times daily.     Yes [provider]  indomethacin (INDOCIN) 50 MG capsule Take 1 capsule (50 mg total) by mouth 3 (three) times daily as needed. 09/17/20  Yes Joni Reining, PA-C  ondansetron (ZOFRAN ODT) 4 MG disintegrating tablet Take 1 tablet (4 mg total) by mouth every 6 (six) hours as needed for nausea. 06/02/21  Yes Vena Austria, MD  progesterone (PROMETRIUM) 100 MG capsule Take 1 capsule (100 mg total) by mouth daily. 07/25/20  Yes Vena Austria, MD  TIMOPTIC 0.5 % ophthalmic solution Place 1 drop into the left eye 2 (two) times daily. 03/25/20   [provider]  XALATAN 0.005 % ophthalmic solution Place 1 drop into the left eye at bedtime.  03/25/20   [provider]    Physical Exam Vitals: Blood pressure 112/68, height 5\' 1"  (1.549 m), weight (!) 316 lb (143.3 kg), last menstrual period 11/05/2019, unknown if currently breastfeeding.  General: NAD HEENT: normocephalic, anicteric Pulmonary: No increased work of breathing Genitourinary:  External: Normal external female genitalia.  Normal urethral meatus, normal  Bartholin's and Skene's glands.    Vagina: Normal vaginal mucosa, no evidence of prolapse.    Cervix: Grossly normal in appearance, no bleeding  Uterus: Non-enlarged, mobile, normal contour.  No CMT  Adnexa: ovaries non-enlarged, no adnexal masses  Rectal: deferred Extremities: no edema, erythema, or tenderness Neurologic: Grossly intact Psychiatric: mood appropriate, affect full  Assessment: 35 y.o. G2P0020 presenting for evaluation of postmenopausal bleeding  Plan: Problem List Items Addressed This Visit   None Visit Diagnoses     Routine screening for STI (sexually transmitted infection)    -  Primary   Relevant Orders   Cervicovaginal ancillary only (Completed)   Abnormal uterine bleeding           1) We discussed that menopause is a clinical diagnosis made after 12 months of amenorrhea.  The average age of menopause in the  [place]  population is 21 but there may be significant variation.  Any bleeding that happens after a 12 month period of amenorrhea warrants further work.  For JJ this bleeding would be considered menopausal bleeding as she is status post BSO 07/25/2021 for the treatment of endometriosis.  Possible etiologies of postmenopausal bleeding were discussed with the patient today.  These may range from benign etiologies such as urethral prolapse and atrophy, to indeterminate lesions such as submucosal fibroids or polyps which would require resection to accurately evaluate. The role of unopposed estrogen in the development of  dndometrial hyperplasia or carcinoma is discussed.  The risk of endometrial hyperplasia is linearly correlated with increasing BMI given the production of estrone by adipose tissue.  Work up will be include transvaginal ultrasound to assess the thickness of the endometrial lining  as well as to assess for focal uterine lesions.  Negative ultrasound evaluation, defined as the absence of focal lesions and endometrial stripe of <62mm, effectively rules out carcinoma and confirms atrophy as the most likely etiology.  Should focal lesions be present these generally require hysteroscopic resection.  Should lining be greater >35mm endometrial biopsy is warranted to rule out hyperplasia or frank endometrial cancer.  Continued episodes of bleeding despite negative ultrasound also warrant endometrial sampling.  As the cervical pathology may also be implicated in postmenopausal bleeding prior cervical cytology was reviewed and repeated if required per ASCCP guidelines.   2) Evaluation by pelvc ultrasound scheduled, with follow up after [place]. EMB discussed and may be performed as well. Pros and cons of these modalities of testing discussed.  - GC/CT cultures obtained  3) Patient asks about vaginal valium which she was previously prescribed by Dr. Derrill Kay.  Given off label use and scheduled drug I would not use valium for  pelvic floor pain at this time.  I have previously encouraged her to see pelvic floor PT but patient has not kept this follow.  4) Vasomotor symptoms - patient was stated on HRT post BSO, will hold off until completion of PMB workup  Vena Austria, MD, [facility], [facility] 07/14/2021, 3:14 PM

## 2021-07-16 LAB — CERVICOVAGINAL ANCILLARY ONLY
Chlamydia: NEGATIVE
Comment: NEGATIVE
Comment: NEGATIVE
Comment: NORMAL
Neisseria Gonorrhea: NEGATIVE
Trichomonas: NEGATIVE

## 2021-08-05 ENCOUNTER — Encounter: Payer: Self-pay | Admitting: Radiology

## 2021-08-05 ENCOUNTER — Ambulatory Visit (INDEPENDENT_AMBULATORY_CARE_PROVIDER_SITE_OTHER): Payer: BC Managed Care – PPO | Admitting: Obstetrics and Gynecology

## 2021-08-05 ENCOUNTER — Encounter: Payer: Self-pay | Admitting: Obstetrics and Gynecology

## 2021-08-05 ENCOUNTER — Other Ambulatory Visit: Payer: Self-pay

## 2021-08-05 VITALS — BP 146/89 | HR 71 | Ht 61.0 in | Wt 317.2 lb

## 2021-08-05 DIAGNOSIS — N939 Abnormal uterine and vaginal bleeding, unspecified: Secondary | ICD-10-CM | POA: Diagnosis not present

## 2021-08-06 NOTE — Progress Notes (Signed)
Obstetrics and Gynecology New Patient Evaluation  Appointment Date: 08/05/2021  OBGYN Clinic: [facility]   Primary Care Provider: [facility], [place]  Referring Provider: Self  Chief Complaint: 2nd opinion re: AUB management  History of Present Illness: Gina Farmer is a 35 y.o. Caucasian G2P0020 (No LMP recorded. (Menstrual status: Other).), seen for the above chief complaint. Her past medical history is significant for HTN, BMI 60, h/o endo on laparoscopy, h/o l/s BSO, chronic pelvic and back pain  Patient had a BSO a year ago for endometriosis and pelvic pain.  Starting around January 2022 she started having qmonth periods. She states she only took a few months of PO HRT and stopped them in October and hasn't been on anything since then.  She saw Dr. Bonney Aid on 8/1 re: this issue. She states that the CMA that checked her in told her she was getting a biopsy but didn't explain to her and this upset her so she declined embx at that time and an u/s and embx at the next visit was set up. Pt is wondering re: plan of care.    Review of Systems: Pertinent items noted in HPI and remainder of comprehensive ROS otherwise negative.    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, left    Morbid obesity (HCC)    Post traumatic stress disorder (PTSD)    PTSD (post-traumatic stress disorder)     Past Surgical History:  Past Surgical History:  Procedure Laterality Date   APPENDECTOMY  2009   INTRAOCULAR LENS IMPLANT, SECONDARY     from eye injury    LAPAROSCOPIC BILATERAL SALPINGO OOPHERECTOMY Bilateral 07/25/2020   Procedure: LAPAROSCOPIC BILATERAL SALPINGO OOPHORECTOMY;  Surgeon: Vena Austria, MD;  Location: [facility] ORS;  Service: Gynecology;  Laterality: Bilateral;   LAPAROSCOPY  2003   LAPAROSCOPY N/A 05/21/2020   Procedure: LAPAROSCOPY DIAGNOSTIC;  Surgeon: Vena Austria, MD;  Location: [facility] ORS;  Service: Gynecology;  Laterality: N/A;   SACROILIAC JOINT INJECTION      Past Obstetrical History:  OB History  Gravida Para Term Preterm AB Living  2       2    SAB IAB Ectopic Multiple Live Births  1 1          # Outcome Date GA Lbr Len/2nd Weight Sex Delivery Anes PTL Lv  2 IAB 2017          1 SAB              Past Gynecological History: As per HPI. History of Pap Smear(s): Yes.   Last pap 03/2020, which was ASCUS/HPV neg  Social History:  Social History   Socioeconomic History   Marital status: Single    Spouse name: Not on file   Number of children: Not on file   Years of education: Not on file   Highest education level: Not on file  Occupational History   Not on file  Tobacco Use   Smoking status: Former  Packs/day: 0.25    Types: Cigarettes    Quit date: 12/19/2020    Years since quitting: 0.6   Smokeless tobacco: Never  Vaping Use   Vaping Use: Some days   Last attempt to quit: 05/14/2018  Substance and Sexual Activity   Alcohol use: Not Currently    Comment: social   Drug use: Yes    Types: Marijuana    Comment: occ   Sexual activity: Yes    Birth control/protection: Surgical  Other Topics Concern   Not on file  Social History Narrative   Not on file   Social Determinants of Health   Financial Resource Strain: Not on file  Food Insecurity: Not on file  Transportation Needs: Not on file  Physical Activity: Not on file  Stress: Not on file  Social Connections: Not on file  Intimate Partner Violence: Not on file    Family History:  Family History  Problem Relation Age of Onset    Hypertension Father    Heart disease Father    Narcolepsy Father    Obesity Father    Hypertension Brother    Obesity Maternal Aunt    Ovarian cancer Maternal Aunt 40   Obesity Paternal Aunt    Heart disease Maternal Grandfather    Ovarian cancer Paternal Grandmother        79s   Uterine cancer Other      Medications Austine D. Radene Gunning" had no medications administered during this visit. Current Outpatient Medications  Medication Sig Dispense Refill   hydroxychloroquine (PLAQUENIL) 200 MG tablet Take 1 tablet by mouth 2 (two) times daily.     hydrOXYzine (VISTARIL) 25 MG capsule Take 25 mg by mouth 3 (three) times daily.      indomethacin (INDOCIN) 50 MG capsule Take 1 capsule (50 mg total) by mouth 3 (three) times daily as needed. 30 capsule 0   ondansetron (ZOFRAN ODT) 4 MG disintegrating tablet Take 1 tablet (4 mg total) by mouth every 6 (six) hours as needed for nausea. 30 tablet 0   TIMOPTIC 0.5 % ophthalmic solution Place 1 drop into the left eye 2 (two) times daily.     XALATAN 0.005 % ophthalmic solution Place 1 drop into the left eye at bedtime.      No current facility-administered medications for this visit.    Allergies Oxcarbazepine and Viibryd [vilazodone hcl]   Physical Exam:  BP (!) 146/89   Pulse 71   Ht 5\' 1"  (1.549 m)   Wt (!) 317 lb 3.2 oz (143.9 kg)   BMI 59.93 kg/m  Body mass index is 59.93 kg/m.  General appearance: Well nourished, well developed female in no acute distress.  Respiratory:  Normal respiratory effort Neuro/Psych:  Normal mood and affect.  Skin:  Warm and dry.   Laboratory: none  Radiology: none  Assessment: pt stable  Plan:  1. Abnormal uterine bleeding (AUB) I told her that I reviewed her history and notes and I agree with plan for u/s and embx and likely update her pap smear and do STI screening too. She is wanting a hysterectomy and states that one of the main reasons she didn't have one with her BSO was due to not  wanting to have to take time off from a major surgery (she works at ) but she states she is strongly desiring one.  I told her that it's too soon to say if a hysterectomy is indicated at this time and that she is in good  hands with Dr. Bonney Aid, which she agrees with but had concerns with how the staff presented the embx; I told her to let him know re: this.   RTC PRN  Cornelia Copa MD Attending [facility])

## 2021-08-11 ENCOUNTER — Other Ambulatory Visit: Payer: Self-pay

## 2021-08-11 ENCOUNTER — Ambulatory Visit
Admission: RE | Admit: 2021-08-11 | Discharge: 2021-08-11 | Disposition: A | Discharge status: Home / Self Care | Payer: BC Managed Care – PPO | Source: Ambulatory Visit | Attending: Obstetrics and Gynecology | Admitting: Obstetrics and Gynecology

## 2021-08-11 DIAGNOSIS — N950 Postmenopausal bleeding: Secondary | ICD-10-CM | POA: Diagnosis present

## 2021-08-12 ENCOUNTER — Other Ambulatory Visit: Payer: Self-pay | Admitting: Occupational Medicine

## 2021-08-12 ENCOUNTER — Ambulatory Visit: Payer: Self-pay

## 2021-08-12 DIAGNOSIS — M25532 Pain in left wrist: Secondary | ICD-10-CM

## 2021-08-12 DIAGNOSIS — M79642 Pain in left hand: Secondary | ICD-10-CM

## 2021-08-15 ENCOUNTER — Encounter: Payer: Self-pay | Admitting: Obstetrics and Gynecology

## 2021-08-15 ENCOUNTER — Ambulatory Visit (INDEPENDENT_AMBULATORY_CARE_PROVIDER_SITE_OTHER): Payer: BC Managed Care – PPO | Admitting: Obstetrics and Gynecology

## 2021-08-15 ENCOUNTER — Other Ambulatory Visit: Payer: Self-pay

## 2021-08-15 VITALS — BP 144/86 | Ht 61.0 in | Wt 314.0 lb

## 2021-08-15 DIAGNOSIS — G8929 Other chronic pain: Secondary | ICD-10-CM

## 2021-08-15 DIAGNOSIS — R030 Elevated blood-pressure reading, without diagnosis of hypertension: Secondary | ICD-10-CM | POA: Diagnosis not present

## 2021-08-15 DIAGNOSIS — N950 Postmenopausal bleeding: Secondary | ICD-10-CM

## 2021-08-15 DIAGNOSIS — Z6841 Body Mass Index (BMI) 40.0 and over, adult: Secondary | ICD-10-CM

## 2021-08-15 DIAGNOSIS — R102 Pelvic and perineal pain: Secondary | ICD-10-CM | POA: Diagnosis not present

## 2021-08-15 DIAGNOSIS — Z87898 Personal history of other specified conditions: Secondary | ICD-10-CM

## 2021-08-15 MED ORDER — ESTRADIOL 1 MG PO TABS: 1.0000 mg | ORAL_TABLET | Freq: Every day | ORAL | 11 refills | Status: DC

## 2021-08-15 MED ORDER — PROGESTERONE MICRONIZED 100 MG PO CAPS: 100.0000 mg | ORAL_CAPSULE | Freq: Every day | ORAL | 11 refills | Status: DC

## 2021-08-15 NOTE — Progress Notes (Signed)
Gynecology Ultrasound Follow Up  Chief Complaint:  Chief Complaint  Patient presents with   Follow-up    [facility] ultrasound. RM 5     History of Present Illness: Patient is a 35 y.o. female who presents today for ultrasound evaluation of postmenopausal bleeding.  Ultrasound demonstrates the following findgins Adnexa: no masses seen  Uterus: Non-enlarged with endometrial stripe  <59mm Additional: no free fluid  Review of Systems: Review of Systems  Constitutional: Negative.   Gastrointestinal:  Positive for abdominal pain. Negative for blood in stool, constipation, diarrhea, heartburn, melena, nausea and vomiting.  Genitourinary: Negative.    Past Medical History:  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, left    Morbid obesity (HCC)    Post traumatic stress disorder (PTSD)    PTSD (post-traumatic stress disorder)     Past Surgical History:  Past Surgical History:  Procedure Laterality Date   APPENDECTOMY  2009   INTRAOCULAR LENS IMPLANT, SECONDARY     from eye injury   LAPAROSCOPIC BILATERAL SALPINGO OOPHERECTOMY Bilateral 07/25/2020   Procedure: LAPAROSCOPIC BILATERAL SALPINGO OOPHORECTOMY;  Surgeon: Vena Austria, MD;  Location: [facility] ORS;  Service: Gynecology;  Laterality: Bilateral;   LAPAROSCOPY  2003   LAPAROSCOPY N/A 05/21/2020   Procedure: LAPAROSCOPY DIAGNOSTIC;  Surgeon: Vena Austria, MD;  Location: [facility] ORS;  Service: Gynecology;  Laterality: N/A;   SACROILIAC JOINT INJECTION      Gynecologic History:  No LMP recorded. (Menstrual status: Oophorectomy). Contraception: post menopausal status Last Pap: 01/18/2020. Results were: .ASCUS with NEGATIVE high risk HPV    Family History:  Family History   Problem Relation Age of Onset   Hypertension Father    Heart disease Father    Narcolepsy Father    Obesity Father    Hypertension Brother    Obesity Maternal Aunt    Ovarian cancer Maternal Aunt 40   Obesity Paternal Aunt    Heart disease Maternal Grandfather    Ovarian cancer Paternal Grandmother        62s   Uterine cancer Other     Social History:  Social History   Socioeconomic History   Marital status: Single    Spouse name: Not on file   Number of children: Not on file   Years of education: Not on file   Highest education level: Not on file  Occupational History   Not on file  Tobacco Use   Smoking status: Former    Packs/day: 0.25    Types: Cigarettes    Quit date: 12/19/2020    Years since quitting: 0.6   Smokeless tobacco: Never  Vaping Use   Vaping Use: Some days   Last attempt to quit: 05/14/2018   Substances: Nicotine, THC, Flavoring  Substance and Sexual Activity   Alcohol use: Not Currently    Comment: social   Drug use: Yes    Types: Marijuana    Comment: occ   Sexual activity: Yes    Birth control/protection: Surgical  Other Topics Concern   Not on file  Social History Narrative   Not on file   Social Determinants of Health   Financial Resource Strain: Not on file  Food Insecurity: Not on file  Transportation Needs: Not on file  Physical Activity: Not on file  Stress: Not on file  Social Connections: Not on file  Intimate Partner Violence: Not on file    Allergies:  Allergies  Allergen Reactions   Lamotrigine Swelling   Oxcarbazepine Swelling and Other (See Comments)    Per pt, "leg swelling" Per pt, "leg swelling" Ankle swelling    Viibryd [Vilazodone Hcl] Swelling    Per pt "leg swelling"    Medications: Prior to Admission medications   Medication Sig Start Date End Date Taking? Authorizing Provider  Albuterol Sulfate (PROAIR RESPICLICK) 108 (90 Base) MCG/ACT AEPB Inhale into the lungs. 08/04/21 08/04/22 Yes [provider]  doxycycline (VIBRAMYCIN) 100 MG capsule Take 100 mg by mouth 2 (two) times daily. 08/14/21  Yes [provider]  estradiol (ESTRACE) 1 MG tablet Take 1 tablet (1 mg total) by mouth daily. 08/15/21  Yes Vena Austria, MD  HYDROcodone-acetaminophen (NORCO/VICODIN) 5-325 MG tablet Take by mouth. 06/17/21  Yes [provider]  hydroxychloroquine (PLAQUENIL) 200 MG tablet Take 1 tablet by mouth 2 (two) times daily. 03/17/21  Yes [provider]  indomethacin (INDOCIN) 50 MG capsule Take 1 capsule (50 mg total) by mouth 3 (three) times daily as needed. 09/17/20  Yes Joni Reining, PA-C  lidocaine (XYLOCAINE) 2 % jelly Apply topically 2 (two) times daily. 08/14/21 08/14/22 Yes [provider]  mupirocin ointment (BACTROBAN) 2 % SMARTSIG:1 Application Topical 2-3 Times Daily 08/14/21  Yes [provider]  ondansetron (ZOFRAN ODT) 4 MG disintegrating tablet Take 1 tablet (4 mg total) by mouth every 6 (six) hours as needed for nausea. 06/02/21  Yes Vena Austria, MD  progesterone (PROMETRIUM) 100 MG capsule Take 1 capsule (100 mg total) by mouth daily. 08/15/21  Yes Vena Austria, MD  propranolol (INDERAL) 20 MG tablet Take 20 mg by mouth 2 (two) times daily. 08/04/21  Yes [provider]    Physical Exam Vitals: Blood pressure (!) 144/86, height 5\' 1"  (1.549 m), weight (!) 314 lb (142.4 kg). Body mass index is 59.33 kg/m.  General: NAD HEENT: normocephalic, anicteric Pulmonary: No increased work of breathing Extremities: no edema, erythema, or tenderness Neurologic: Grossly intact, normal gait Psychiatric: mood appropriate, affect full  Operative Findings 05/22/3021  Small endometriotic implant left uterosacral ligament   Endometriosis implant right ovarian fossa  DG Wrist Complete Left  Result Date: 08/12/2021 CLINICAL DATA:  LEFT wrist pain.  Blunt trauma. EXAM: LEFT WRIST - COMPLETE 3+ VIEW COMPARISON:  None. FINDINGS: No  distal radius or ulnar fracture. Radiocarpal joint is intact. No carpal fracture. No soft tissue abnormality. IMPRESSION: No fracture or dislocation. Electronically Signed   By: 08/14/2021 M.D.   On: 08/12/2021 14:25   DG Hand Complete Left  Result Date: 08/12/2021 CLINICAL DATA:  Hand pain. Blunt trauma to hand and wrist yesterday. EXAM: LEFT HAND - COMPLETE 3+ VIEW COMPARISON:  None. FINDINGS: There is no evidence of fracture or dislocation. There is no evidence of arthropathy or other focal bone abnormality. Soft tissues are unremarkable. IMPRESSION: Negative. Electronically Signed   By: 08/14/2021 M.D.   On: 08/12/2021 14:29   08/14/2021 PELVIC COMPLETE WITH TRANSVAGINAL  Result Date: 08/11/2021 CLINICAL DATA:  Postmenopausal bleeding and spotting for 10 months; patient indicates history of BILATERAL oophorectomy for endometriosis EXAM: TRANSABDOMINAL AND TRANSVAGINAL ULTRASOUND OF PELVIS TECHNIQUE: Both transabdominal and transvaginal ultrasound examinations of the pelvis were performed. Transabdominal technique was performed for  global imaging of the pelvis including uterus, ovaries, adnexal regions, and pelvic cul-de-sac. It was necessary to proceed with endovaginal exam following the transabdominal exam to visualize the endometrium and ovaries. COMPARISON:  05/06/2020 FINDINGS: Uterus Measurements: 5.4 x 3.1 x 3.3 cm = volume: 29 mL. Anteverted. Normal morphology without mass Endometrium Thickness: 2 mm. Suboptimally visualized due to shadowing on transvaginal imaging. No gross evidence of mass. Small amount of fluid is seen at the endocervical canal into the lower uterine segment. Right ovary Patient reports surgically absent, not visualized Left ovary Patient reports surgically absent, not visualized Other findings No free pelvic fluid.  No adnexal masses. IMPRESSION: Surgical absence of ovaries. Small amount of nonspecific fluid in endocervical canal extending into lower uterine segment. Normal  thickness endometrial complex 2 mm thick; in the setting of post-menopausal bleeding, this is consistent with a benign etiology such as endometrial atrophy. If bleeding remains unresponsive to hormonal or medical therapy, sonohysterogram should be considered for focal lesion work-up. (Ref: Radiological Reasoning: Algorithmic Workup of Abnormal Vaginal Bleeding with Endovaginal Sonography and Sonohysterography. AJR 2008; [id number]:[id number]) Electronically Signed   By: Ulyses Southward M.D.   On: 08/11/2021 16:33    Assessment: 35 y.o. G2P0020 chronic pelvic pain, postmenopausal bleeding   Plan: Problem List Items Addressed This Visit   None Visit Diagnoses     Class 3 severe obesity without serious comorbidity with body mass index (BMI) of 50.0 to 59.9 in adult, unspecified obesity type (HCC)    -  Primary   Relevant Orders   Ambulatory referral to Sleep Studies   Elevated blood pressure reading       Relevant Orders   Ambulatory referral to Sleep Studies   Chronic pelvic pain in female       Relevant Medications   HYDROcodone-acetaminophen (NORCO/VICODIN) 5-325 MG tablet   History of snoring       Relevant Orders   Ambulatory referral to Sleep Studies   Postmenopausal bleeding           1) Chronic pelvic pain - normal ultrasound.  No gyn etiology identified.  She has is status post prior BSO secondary to endometriosis.  At present there is no indication for hysterectomy and we discussed that following BSO 07/25/2021 any remaining endometriosis should be dormant - declines  2) Postmenopausal bleeding - endometrial stripe less that 31mm no further work up indicated.  May restart HRT  3) The patient has had significant issues with pneumoperitoneum and trendelenburg positioning.  At present she is not a surgical candidate for what would amount  to an elective hysterectomy for chronic pelvic pain in the absence of other identifiable pathology.  We discussed weight loss, bariatric surgery.  Discussed  bariatrics referral which at present she declines - will arrange for sleep study given problems with ventilation in past surgery case, elevated BP, as well as morbid obesity Body mass index is 59.33 kg/m.  3) A total of 30 minutes were spent in face-to-face contact with the patient during this encounter with over half of that time devoted to counseling and coordination of care.  4) Return in about 6 months (around 02/12/2022) for medication follow up.     Vena Austria, MD, [facility], [facility]

## 2021-09-05 ENCOUNTER — Emergency Department
Admission: EM | Admit: 2021-09-05 | Discharge: 2021-09-05 | Disposition: A | Discharge status: Home / Self Care | Payer: BC Managed Care – PPO | Attending: Emergency Medicine | Admitting: Emergency Medicine

## 2021-09-05 ENCOUNTER — Other Ambulatory Visit: Payer: Self-pay

## 2021-09-05 DIAGNOSIS — Z20822 Contact with and (suspected) exposure to covid-19: Secondary | ICD-10-CM | POA: Diagnosis not present

## 2021-09-05 DIAGNOSIS — Z87891 Personal history of nicotine dependence: Secondary | ICD-10-CM | POA: Diagnosis not present

## 2021-09-05 DIAGNOSIS — M791 Myalgia, unspecified site: Secondary | ICD-10-CM | POA: Diagnosis present

## 2021-09-05 DIAGNOSIS — J1089 Influenza due to other identified influenza virus with other manifestations: Secondary | ICD-10-CM | POA: Insufficient documentation

## 2021-09-05 DIAGNOSIS — J101 Influenza due to other identified influenza virus with other respiratory manifestations: Secondary | ICD-10-CM

## 2021-09-05 LAB — COMPREHENSIVE METABOLIC PANEL
ALT: 25 U/L (ref 0–44)
AST: 23 U/L (ref 15–41)
Albumin: 3.5 g/dL (ref 3.5–5.0)
Alkaline Phosphatase: 57 U/L (ref 38–126)
Anion gap: 8 (ref 5–15)
BUN: 13 mg/dL (ref 6–20)
CO2: 27 mmol/L (ref 22–32)
Calcium: 8.6 mg/dL — ABNORMAL LOW (ref 8.9–10.3)
Chloride: 101 mmol/L (ref 98–111)
Creatinine, Ser: 0.89 mg/dL (ref 0.44–1.00)
GFR, Estimated: >60 mL/min (ref >60)
Glucose, Bld: 110 mg/dL — ABNORMAL HIGH (ref 70–99)
Potassium: 4.2 mmol/L (ref 3.5–5.1)
Sodium: 136 mmol/L (ref 135–145)
Total Bilirubin: 0.8 mg/dL (ref 0.3–1.2)
Total Protein: 7.4 g/dL (ref 6.5–8.1)

## 2021-09-05 LAB — RESP PANEL BY RT-PCR (FLU A&B, COVID) ARPGX2
Influenza A by PCR: POSITIVE — AB
Influenza B by PCR: NEGATIVE
SARS Coronavirus 2 by RT PCR: NEGATIVE

## 2021-09-05 LAB — CBC
HCT: 44.9 % (ref 36.0–46.0)
Hemoglobin: 15 g/dL (ref 12.0–15.0)
MCH: 28.7 pg (ref 26.0–34.0)
MCHC: 33.4 g/dL (ref 30.0–36.0)
MCV: 86 fL (ref 80.0–100.0)
Platelets: 229 10*3/uL (ref 150–400)
RBC: 5.22 MIL/uL — ABNORMAL HIGH (ref 3.87–5.11)
RDW: 14 % (ref 11.5–15.5)
WBC: 8.7 10*3/uL (ref 4.0–10.5)
nRBC: 0 % (ref 0.0–0.2)

## 2021-09-05 MED ORDER — KETOROLAC TROMETHAMINE 30 MG/ML IJ SOLN
30.0000 mg | Freq: Once | INTRAMUSCULAR | Status: AC
  Administered 2021-09-05: 30 mg via INTRAMUSCULAR
  Filled 2021-09-05: qty 1

## 2021-09-05 MED ORDER — OSELTAMIVIR PHOSPHATE 75 MG PO CAPS: 75.0000 mg | ORAL_CAPSULE | Freq: Two times a day (BID) | ORAL | 0 refills | Status: AC

## 2021-09-05 MED ORDER — ONDANSETRON 4 MG PO TBDP: 4.0000 mg | ORAL_TABLET | Freq: Three times a day (TID) | ORAL | 0 refills | Status: DC | PRN

## 2021-09-05 MED ORDER — ONDANSETRON 4 MG PO TBDP
4.0000 mg | ORAL_TABLET | Freq: Once | ORAL | Status: AC
  Administered 2021-09-05: 4 mg via ORAL
  Filled 2021-09-05: qty 1

## 2021-09-05 NOTE — ED Provider Notes (Signed)
[facility] Emergency Department Provider Note   ____________________________________________    I have reviewed the triage vital signs and the nursing notes.   HISTORY  Chief Complaint Cough and Altered Mental Status     HPI Gina Farmer is a 35 y.o. female with history of bipolar disorder, anxiety who presents with complaints of body aches, cough, chills, and a sense of brain fog and has been ongoing for 2 to 3 days now.  She is concerned that she could have COVID-19.  No sick contacts reported.  Has not take anything for this.  Mild nausea with decreased p.o. intake.  No abdominal pain  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, left    Morbid obesity (HCC)    Post traumatic stress disorder (PTSD)    PTSD (post-traumatic stress disorder)     Patient Active Problem List   Diagnosis Date Noted   Endometriosis determined by laparoscopy 05/31/2020   Trichomoniasis 12/12/2019    Past Surgical History:  Procedure Laterality Date   APPENDECTOMY  2009   INTRAOCULAR LENS IMPLANT, SECONDARY     from eye injury   LAPAROSCOPIC BILATERAL SALPINGO OOPHERECTOMY Bilateral 07/25/2020   Procedure: LAPAROSCOPIC BILATERAL SALPINGO OOPHORECTOMY;  Surgeon: Vena Austria, MD;  Location: [facility] ORS;  Service: Gynecology;  Laterality: Bilateral;   LAPAROSCOPY  2003   LAPAROSCOPY N/A 05/21/2020   Procedure: LAPAROSCOPY DIAGNOSTIC;  Surgeon: Vena Austria, MD;  Location: [facility] ORS;  Service: Gynecology;  Laterality: N/A;   SACROILIAC JOINT INJECTION      Prior to Admission medications   Medication Sig Start Date End Date Taking? Authorizing Provider  ondansetron (ZOFRAN ODT) 4 MG disintegrating tablet Take 1  tablet (4 mg total) by mouth every 8 (eight) hours as needed. 09/05/21  Yes Jene Every, MD  oseltamivir (TAMIFLU) 75 MG capsule Take 1 capsule (75 mg total) by mouth 2 (two) times daily for 5 days. 09/05/21 09/10/21 Yes Jene Every, MD  Albuterol Sulfate (PROAIR RESPICLICK) 108 (90 Base) MCG/ACT AEPB Inhale into the lungs. 08/04/21 08/04/22  [provider]  doxycycline (VIBRAMYCIN) 100 MG capsule Take 100 mg by mouth 2 (two) times daily. 08/14/21   [provider]  estradiol (ESTRACE) 1 MG tablet Take 1 tablet (1 mg total) by mouth daily. 08/15/21   Vena Austria, MD  HYDROcodone-acetaminophen (NORCO/VICODIN) 5-325 MG tablet Take by mouth. 06/17/21   [provider]  hydroxychloroquine (PLAQUENIL) 200 MG tablet Take 1 tablet by mouth 2 (two) times daily. 03/17/21   [provider]  indomethacin (INDOCIN) 50 MG capsule Take 1 capsule (50 mg total) by mouth 3 (three) times daily as needed. 09/17/20   Joni Reining, PA-C  lidocaine (XYLOCAINE) 2 % jelly Apply topically 2 (two) times daily. 08/14/21 08/14/22  [provider]  mupirocin ointment (BACTROBAN) 2 % SMARTSIG:1 Application Topical 2-3 Times Daily 08/14/21   [provider]  progesterone (PROMETRIUM) 100 MG capsule Take 1 capsule (100 mg total) by mouth daily. 08/15/21   Vena Austria, MD  propranolol (INDERAL) 20 MG tablet Take 20 mg by mouth 2 (two) times daily. 08/04/21   [provider]  Allergies Lamotrigine, Oxcarbazepine, and Viibryd [vilazodone hcl]  Family History  Problem Relation Age of Onset   Hypertension Father    Heart disease Father    Narcolepsy Father    Obesity Father    Hypertension Brother    Obesity Maternal Aunt    Ovarian cancer Maternal Aunt 40   Obesity Paternal Aunt    Heart disease Maternal Grandfather    Ovarian cancer Paternal Grandmother        70s   Uterine cancer Other     Social History Social History   Tobacco Use   Smoking  status: Former    Packs/day: 0.25    Types: Cigarettes    Quit date: 12/19/2020    Years since quitting: 0.7   Smokeless tobacco: Never  Vaping Use   Vaping Use: Some days   Last attempt to quit: 05/14/2018   Substances: Nicotine, THC, Flavoring  Substance Use Topics   Alcohol use: Not Currently    Comment: social   Drug use: Yes    Types: Marijuana    Comment: occ    Review of Systems  Constitutional: As above  ENT: Mild sore throat, congestion   Gastrointestinal: No abdominal pain.  Mild nausea Genitourinary: Negative for dysuria. Musculoskeletal: Positive myalgias Skin: Negative for rash. Neurological: Positive headaches    ____________________________________________   PHYSICAL EXAM:  VITAL SIGNS: ED Triage Vitals  Enc Vitals Group     BP 09/05/21 1224 124/83     Pulse Rate 09/05/21 1224 (!) 101     Resp 09/05/21 1224 18     Temp 09/05/21 1224 (!) 100.5 F (38.1 C)     Temp Source 09/05/21 1224 Oral     SpO2 09/05/21 1224 94 %     Weight 09/05/21 1225 (!) 144.7 kg (319 lb)     Height 09/05/21 1225 1.549 m (5\' 1" )     Head Circumference --      Peak Flow --      Pain Score 09/05/21 1225 9     Pain Loc --      Pain Edu? --      Excl. in GC? --      Constitutional: Alert and oriented. No acute distress. Pleasant and interactive Eyes: Conjunctivae are normal.  Head: Atraumatic. Nose: No congestion/rhinnorhea. Mouth/Throat: Mucous membranes are moist.   Cardiovascular: Normal rate, regular rhythm.  Respiratory: Normal respiratory effort.  No retractions.  Clear to auscultation bilaterally  Musculoskeletal: No lower extremity tenderness nor edema.   Neurologic:  No gross focal neurologic deficits are appreciated.   Skin:  Skin is warm, dry and intact. No rash noted.   ____________________________________________   LABS (all labs ordered are listed, but only abnormal results are displayed)  Labs Reviewed  RESP PANEL BY RT-PCR (FLU A&B, COVID)  ARPGX2 - Abnormal; Notable for the following components:      Result Value   Influenza A by PCR POSITIVE (*)    All other components within normal limits  COMPREHENSIVE METABOLIC PANEL - Abnormal; Notable for the following components:   Glucose, Bld 110 (*)    Calcium 8.6 (*)    All other components within normal limits  CBC - Abnormal; Notable for the following components:   RBC 5.22 (*)    All other components within normal limits  CBG MONITORING, ED  POC URINE PREG, ED   ____________________________________________  EKG   ____________________________________________  RADIOLOGY  None ____________________________________________   PROCEDURES  Procedure(s) performed: No  Procedures  Critical Care performed: No ____________________________________________   INITIAL IMPRESSION / ASSESSMENT AND PLAN / ED COURSE  Pertinent labs & imaging results that were available during my care of the patient were reviewed by me and considered in my medical decision making (see chart for details).   Patient well-appearing and in no acute distress, presents with elevated temperature, fatigue, headache, chills, suspicious for viral syndrome, possible COVID-19 versus influenza.  Lab work today is reassuring, white blood cell count is normal.  Chemistry is unremarkable  COVID test is negative, influenza A is positive  Will treat with Tamiflu, Zofran as needed, return precautions discussed   ____________________________________________   FINAL CLINICAL IMPRESSION(S) / ED DIAGNOSES  Final diagnoses:  Influenza A      NEW MEDICATIONS STARTED DURING THIS VISIT:  Discharge Medication List as of 09/05/2021  2:05 PM     START taking these medications   Details  oseltamivir (TAMIFLU) 75 MG capsule Take 1 capsule (75 mg total) by mouth 2 (two) times daily for 5 days., Starting Fri 09/05/2021, Until Wed 09/10/2021, Normal         Note:  This document was prepared using Dragon  voice recognition software and may include unintentional dictation errors.    Jene Every, MD 09/05/21 1455

## 2021-09-05 NOTE — ED Triage Notes (Signed)
Pt c/o cough congestion fever, fatigue, feeling confused and having "delirium" , HA, chill, fever for the past 3 days  Pt states she was having some hallucinations of more cats being in the house than were in the house yesterday.

## 2021-09-11 ENCOUNTER — Emergency Department: Payer: BC Managed Care – PPO

## 2021-09-11 ENCOUNTER — Emergency Department
Admission: EM | Admit: 2021-09-11 | Discharge: 2021-09-11 | Disposition: A | Discharge status: Home / Self Care | Payer: BC Managed Care – PPO | Attending: Emergency Medicine | Admitting: Emergency Medicine

## 2021-09-11 ENCOUNTER — Encounter: Payer: Self-pay | Admitting: Emergency Medicine

## 2021-09-11 ENCOUNTER — Other Ambulatory Visit: Payer: Self-pay

## 2021-09-11 DIAGNOSIS — Z9049 Acquired absence of other specified parts of digestive tract: Secondary | ICD-10-CM | POA: Insufficient documentation

## 2021-09-11 DIAGNOSIS — N12 Tubulo-interstitial nephritis, not specified as acute or chronic: Secondary | ICD-10-CM | POA: Insufficient documentation

## 2021-09-11 DIAGNOSIS — Z87891 Personal history of nicotine dependence: Secondary | ICD-10-CM | POA: Insufficient documentation

## 2021-09-11 DIAGNOSIS — R109 Unspecified abdominal pain: Secondary | ICD-10-CM | POA: Diagnosis present

## 2021-09-11 DIAGNOSIS — K219 Gastro-esophageal reflux disease without esophagitis: Secondary | ICD-10-CM | POA: Diagnosis not present

## 2021-09-11 DIAGNOSIS — N390 Urinary tract infection, site not specified: Secondary | ICD-10-CM

## 2021-09-11 LAB — URINALYSIS, ROUTINE W REFLEX MICROSCOPIC
Bilirubin Urine: NEGATIVE
Glucose, UA: NEGATIVE mg/dL
Nitrite: POSITIVE — AB
Protein, ur: NEGATIVE mg/dL
Specific Gravity, Urine: 1.02 (ref 1.005–1.030)
pH: 7.5 (ref 5.0–8.0)

## 2021-09-11 LAB — COMPREHENSIVE METABOLIC PANEL
ALT: 37 U/L (ref 0–44)
AST: 20 U/L (ref 15–41)
Albumin: 3.2 g/dL — ABNORMAL LOW (ref 3.5–5.0)
Alkaline Phosphatase: 63 U/L (ref 38–126)
Anion gap: 10 (ref 5–15)
BUN: 15 mg/dL (ref 6–20)
CO2: 26 mmol/L (ref 22–32)
Calcium: 8.7 mg/dL — ABNORMAL LOW (ref 8.9–10.3)
Chloride: 105 mmol/L (ref 98–111)
Creatinine, Ser: 0.77 mg/dL (ref 0.44–1.00)
GFR, Estimated: >60 mL/min (ref >60)
Glucose, Bld: 113 mg/dL — ABNORMAL HIGH (ref 70–99)
Potassium: 3.7 mmol/L (ref 3.5–5.1)
Sodium: 141 mmol/L (ref 135–145)
Total Bilirubin: 0.7 mg/dL (ref 0.3–1.2)
Total Protein: 6.6 g/dL (ref 6.5–8.1)

## 2021-09-11 LAB — CBC WITH DIFFERENTIAL/PLATELET
Abs Immature Granulocytes: 0.05 10*3/uL (ref 0.00–0.07)
Basophils Absolute: 0 10*3/uL (ref 0.0–0.1)
Basophils Relative: 0 %
Eosinophils Absolute: 0.1 10*3/uL (ref 0.0–0.5)
Eosinophils Relative: 1 %
HCT: 45.9 % (ref 36.0–46.0)
Hemoglobin: 14.9 g/dL (ref 12.0–15.0)
Immature Granulocytes: 0 %
Lymphocytes Relative: 20 %
Lymphs Abs: 2.8 10*3/uL (ref 0.7–4.0)
MCH: 28.9 pg (ref 26.0–34.0)
MCHC: 32.5 g/dL (ref 30.0–36.0)
MCV: 89 fL (ref 80.0–100.0)
Monocytes Absolute: 0.7 10*3/uL (ref 0.1–1.0)
Monocytes Relative: 5 %
Neutro Abs: 9.9 10*3/uL — ABNORMAL HIGH (ref 1.7–7.7)
Neutrophils Relative %: 74 %
Platelets: 231 10*3/uL (ref 150–400)
RBC: 5.16 MIL/uL — ABNORMAL HIGH (ref 3.87–5.11)
RDW: 13.8 % (ref 11.5–15.5)
WBC: 13.5 10*3/uL — ABNORMAL HIGH (ref 4.0–10.5)
nRBC: 0 % (ref 0.0–0.2)

## 2021-09-11 LAB — POC URINE PREG, ED: Preg Test, Ur: NEGATIVE

## 2021-09-11 LAB — PROCALCITONIN: Procalcitonin: <0.1 ng/mL

## 2021-09-11 LAB — URINALYSIS, MICROSCOPIC (REFLEX)

## 2021-09-11 LAB — LIPASE, BLOOD: Lipase: 28 U/L (ref 11–51)

## 2021-09-11 LAB — LACTIC ACID, PLASMA: Lactic Acid, Venous: 1.1 mmol/L (ref 0.5–1.9)

## 2021-09-11 MED ORDER — KETOROLAC TROMETHAMINE 30 MG/ML IJ SOLN
15.0000 mg | Freq: Once | INTRAMUSCULAR | Status: AC
  Administered 2021-09-11: 15 mg via INTRAVENOUS
  Filled 2021-09-11: qty 1

## 2021-09-11 MED ORDER — CEPHALEXIN 500 MG PO CAPS: 500.0000 mg | ORAL_CAPSULE | Freq: Three times a day (TID) | ORAL | 0 refills | Status: DC

## 2021-09-11 MED ORDER — ONDANSETRON 4 MG PO TBDP: 4.0000 mg | ORAL_TABLET | Freq: Three times a day (TID) | ORAL | 0 refills | Status: DC | PRN

## 2021-09-11 MED ORDER — ONDANSETRON HCL 4 MG/2ML IJ SOLN
4.0000 mg | Freq: Once | INTRAMUSCULAR | Status: AC
  Administered 2021-09-11: 4 mg via INTRAVENOUS
  Filled 2021-09-11: qty 2

## 2021-09-11 MED ORDER — HYDROMORPHONE HCL 1 MG/ML IJ SOLN
0.5000 mg | Freq: Once | INTRAMUSCULAR | Status: AC
  Administered 2021-09-11: 0.5 mg via INTRAVENOUS
  Filled 2021-09-11: qty 1

## 2021-09-11 MED ORDER — SODIUM CHLORIDE 0.9 % IV SOLN
1.0000 g | Freq: Once | INTRAVENOUS | Status: AC
  Administered 2021-09-11: 1 g via INTRAVENOUS
  Filled 2021-09-11: qty 10

## 2021-09-11 MED ORDER — LACTATED RINGERS IV BOLUS
1000.0000 mL | Freq: Once | INTRAVENOUS | Status: AC
  Administered 2021-09-11: 1000 mL via INTRAVENOUS

## 2021-09-11 MED ORDER — HYDROCODONE-ACETAMINOPHEN 5-325 MG PO TABS: 1.0000 | ORAL_TABLET | Freq: Four times a day (QID) | ORAL | 0 refills | Status: DC | PRN

## 2021-09-11 MED ORDER — IBUPROFEN 800 MG PO TABS: 800.0000 mg | ORAL_TABLET | Freq: Three times a day (TID) | ORAL | 0 refills | Status: DC | PRN

## 2021-09-11 NOTE — Discharge Instructions (Signed)
1.  Take antibiotic as prescribed (Keflex 500mg  3 times daily x7 days). 2.  You may take pain and nausea medicines as needed (Motrin/Norco/Zofran). 3.  Drink plenty of fluids daily. 4.  Return to the ER for worsening symptoms, persistent vomiting, difficulty breathing or other concerns.

## 2021-09-11 NOTE — ED Triage Notes (Signed)
Pt to triage via w/c with no distress noted; pt reports pain to left side radiating into abd since yesterday with no accomp symptoms

## 2021-09-11 NOTE — ED Notes (Signed)
ERMD at bedside at this time 

## 2021-09-11 NOTE — ED Provider Notes (Signed)
[facility] Emergency Department Provider Note   ____________________________________________   Event Date/Time   First MD Initiated Contact with Patient 09/11/21 [phone]     (approximate)  I have reviewed the triage vital signs and the nursing notes.   HISTORY  Chief Complaint Abdominal Pain    HPI Gina Farmer is a 35 y.o. female who presents to the ED from home with a chief complaint of left flank/abdominal pain.  Sudden onset yesterday evening.  Awoke patient from sleep.  No associated fever, chills, nausea/vomiting or hematuria.     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, left    Morbid obesity (HCC)    Post traumatic stress disorder (PTSD)    PTSD (post-traumatic stress disorder)     Patient Active Problem List   Diagnosis Date Noted   Endometriosis determined by laparoscopy 05/31/2020   Trichomoniasis 12/12/2019    Past Surgical History:  Procedure Laterality Date   APPENDECTOMY  2009   INTRAOCULAR LENS IMPLANT, SECONDARY     from eye injury   LAPAROSCOPIC BILATERAL SALPINGO OOPHERECTOMY Bilateral 07/25/2020   Procedure: LAPAROSCOPIC BILATERAL SALPINGO OOPHORECTOMY;  Surgeon: Vena Austria, MD;  Location: [facility] ORS;  Service: Gynecology;  Laterality: Bilateral;   LAPAROSCOPY  2003   LAPAROSCOPY N/A 05/21/2020   Procedure: LAPAROSCOPY DIAGNOSTIC;  Surgeon: Vena Austria, MD;  Location: [facility] ORS;  Service: Gynecology;  Laterality: N/A;   SACROILIAC JOINT INJECTION      Prior to Admission medications   Medication Sig Start Date End Date Taking? Authorizing Provider  cephALEXin (KEFLEX) 500 MG capsule Take 1 capsule (500 mg total) by mouth 3 (three) times daily. 09/11/21  Yes Irean Hong, MD  HYDROcodone-acetaminophen (NORCO) 5-325 MG tablet Take 1 tablet by mouth every 6 (six) hours as needed for moderate pain. 09/11/21  Yes Irean Hong, MD  ibuprofen (ADVIL) 800 MG tablet Take 1 tablet (800 mg total) by mouth every 8 (eight) hours as needed for moderate pain. 09/11/21  Yes Irean Hong, MD  ondansetron (ZOFRAN ODT) 4 MG disintegrating tablet Take 1 tablet (4 mg total) by mouth every 8 (eight) hours as needed for nausea or vomiting. 09/11/21  Yes Irean Hong, MD  Albuterol Sulfate (PROAIR RESPICLICK) 108 (90 Base) MCG/ACT AEPB Inhale into the lungs. 08/04/21 08/04/22  [provider]  doxycycline (VIBRAMYCIN) 100 MG capsule Take 100 mg by mouth 2 (two) times daily. 08/14/21   [provider]  estradiol (ESTRACE) 1 MG tablet Take 1 tablet (1 mg total) by mouth daily. 08/15/21   Vena Austria, MD  hydroxychloroquine (PLAQUENIL) 200 MG tablet Take 1 tablet by mouth 2 (two) times daily. 03/17/21   [provider]  indomethacin (INDOCIN) 50 MG capsule Take 1 capsule (50 mg total) by mouth 3 (three) times daily as needed. 09/17/20   Joni Reining, PA-C  lidocaine (XYLOCAINE) 2 % jelly Apply topically 2 (two) times daily. 08/14/21 08/14/22  [provider]  mupirocin ointment (BACTROBAN) 2 % SMARTSIG:1 Application Topical 2-3 Times Daily 08/14/21   [provider]  progesterone (PROMETRIUM) 100 MG capsule Take 1 capsule (100 mg total) by mouth daily. 08/15/21  Vena Austria, MD  propranolol (INDERAL) 20 MG tablet Take 20 mg by mouth 2 (two) times daily. 08/04/21   [provider]    Allergies Lamotrigine, Oxcarbazepine, and Viibryd [vilazodone hcl]  Family History  Problem Relation Age of Onset   Hypertension Father    Heart disease Father    Narcolepsy Father    Obesity Father    Hypertension Brother    Obesity Maternal Aunt    Ovarian cancer Maternal Aunt 40   Obesity Paternal Aunt    Heart disease Maternal Grandfather     Ovarian cancer Paternal Grandmother        23s   Uterine cancer Other     Social History Social History   Tobacco Use   Smoking status: Former    Packs/day: 0.25    Types: Cigarettes    Quit date: 12/19/2020    Years since quitting: 0.7   Smokeless tobacco: Never  Vaping Use   Vaping Use: Some days   Last attempt to quit: 05/14/2018   Substances: Nicotine, THC, Flavoring  Substance Use Topics   Alcohol use: Not Currently    Comment: social   Drug use: Yes    Types: Marijuana    Comment: occ    Review of Systems  Constitutional: No fever/chills Eyes: No visual changes. ENT: No sore throat. Cardiovascular: Denies chest pain. Respiratory: Denies shortness of breath. Gastrointestinal: Positive for left flank and abdominal pain.  No nausea, no vomiting.  No diarrhea.  No constipation. Genitourinary: Negative for dysuria. Musculoskeletal: Negative for back pain. Skin: Negative for rash. Neurological: Negative for headaches, focal weakness or numbness.   ____________________________________________   PHYSICAL EXAM:  VITAL SIGNS: ED Triage Vitals  Enc Vitals Group     BP 09/11/21 0243 (!) 148/92     Pulse Rate 09/11/21 0243 74     Resp 09/11/21 0243 20     Temp 09/11/21 0243 99.1 F (37.3 C)     Temp Source 09/11/21 0243 Oral     SpO2 09/11/21 0243 99 %     Weight 09/11/21 0241 (!) 319 lb 0.1 oz (144.7 kg)     Height 09/11/21 0241 5\' 1"  (1.549 m)     Head Circumference --      Peak Flow --      Pain Score 09/11/21 0241 10     Pain Loc --      Pain Edu? --      Excl. in GC? --     Constitutional: Alert and oriented.  Uncomfortable appearing and in moderate acute distress. Eyes: Conjunctivae are normal. PERRL. EOMI. Head: Atraumatic. Nose: No congestion/rhinnorhea. Mouth/Throat: Mucous membranes are moist.   Neck: No stridor.   Cardiovascular: Normal rate, regular rhythm. Grossly normal heart sounds.  Good peripheral circulation. Respiratory: Normal  respiratory effort.  No retractions. Lungs CTAB. Gastrointestinal: Soft and mildly tender to palpation left lower quadrant without rebound or guarding. No distention. No abdominal bruits.  Mild left CVA tenderness. Musculoskeletal: No lower extremity tenderness nor edema.  No joint effusions. Neurologic:  Normal speech and language. No gross focal neurologic deficits are appreciated. No gait instability. Skin:  Skin is warm, dry and intact. No rash noted. Psychiatric: Mood and affect are normal. Speech and behavior are normal.  ____________________________________________   LABS (all labs ordered are listed, but only abnormal results are displayed)  Labs Reviewed  CBC WITH DIFFERENTIAL/PLATELET - Abnormal; Notable for the following components:      Result Value   WBC  13.5 (*)    RBC 5.16 (*)    Neutro Abs 9.9 (*)    All other components within normal limits  COMPREHENSIVE METABOLIC PANEL - Abnormal; Notable for the following components:   Glucose, Bld 113 (*)    Calcium 8.7 (*)    Albumin 3.2 (*)    All other components within normal limits  URINALYSIS, ROUTINE W REFLEX MICROSCOPIC - Abnormal; Notable for the following components:   Hgb urine dipstick MODERATE (*)    Ketones, ur TRACE (*)    Nitrite POSITIVE (*)    Leukocytes,Ua TRACE (*)    All other components within normal limits  URINALYSIS, MICROSCOPIC (REFLEX) - Abnormal; Notable for the following components:   Bacteria, UA MANY (*)    All other components within normal limits  CULTURE, BLOOD (ROUTINE X 2)  CULTURE, BLOOD (ROUTINE X 2)  URINE CULTURE  LIPASE, BLOOD  LACTIC ACID, PLASMA  PROCALCITONIN  POC URINE PREG, ED   ____________________________________________  EKG  None ____________________________________________  RADIOLOGY I, Mirca Yale J, personally viewed and evaluated these images (plain radiographs) as part of my medical decision making, as well as reviewing the written report by the  radiologist.  ED MD interpretation: No ureteral stone  Official radiology report(s): CT Renal Stone Study  Result Date: 09/11/2021 CLINICAL DATA:  Flank pain with kidney stone suspected EXAM: CT ABDOMEN AND PELVIS WITHOUT CONTRAST TECHNIQUE: Multidetector CT imaging of the abdomen and pelvis was performed following the standard protocol without IV contrast. COMPARISON:  05/29/2010 FINDINGS: Lower chest:  No contributory findings. Hepatobiliary: No focal liver abnormality.No evidence of biliary obstruction or stone. Pancreas: Unremarkable. Spleen: Unremarkable. Adrenals/Urinary Tract: Negative adrenals. No hydronephrosis or stone. Unremarkable bladder. Stomach/Bowel: No obstruction. Appendectomy. No visible inflammation Vascular/Lymphatic: No acute vascular abnormality. No mass or adenopathy. Reproductive:No pathologic findings. Other: No ascites or pneumoperitoneum. Musculoskeletal: No acute abnormalities. Generalized disc narrowing and endplate spurring. IMPRESSION: No acute finding or explanation for pain. Electronically Signed   By: Tiburcio Pea M.D.   On: 09/11/2021 04:39    ____________________________________________   PROCEDURES  Procedure(s) performed (including Critical Care):  Procedures   ____________________________________________   INITIAL IMPRESSION / ASSESSMENT AND PLAN / ED COURSE  As part of my medical decision making, I reviewed the following data within the electronic MEDICAL RECORD NUMBER Nursing notes reviewed and incorporated, Labs reviewed, Old chart reviewed, Radiograph reviewed, and Notes from prior ED visits     35 year old female presenting with left flank/abdominal pain. Differential diagnosis includes, but is not limited to, ovarian cyst, ovarian torsion, acute appendicitis, diverticulitis, urinary tract infection/pyelonephritis, endometriosis, bowel obstruction, colitis, renal colic, gastroenteritis, hernia, fibroids, endometriosis, pregnancy related pain  including ectopic pregnancy, etc.   Laboratory results demonstrate mild leukocytosis, nitrite and leukocyte positive UTI.  Will initiate IV fluid resuscitation, IV Toradol for pain paired with IV Zofran for nausea.  Will obtain CT renal colic study to evaluate for obstructive ureteral stone.  Will reassess.  Clinical Course as of 09/11/21 0656  Thu Sep 11, 2021  0528 Updated patient on CT imaging result.  Patient still complains of pain; will administer additional analgesic.  Will order IV Rocephin for pyelonephritis. [JS]  [id number] Will discharge home after completion of IV antibiotics.  Will discharge home on Keflex; Norco and Zofran to use as needed.  Strict return precautions given.  Patient verbalizes understanding and agrees with plan of care. [JS]    Clinical Course User Index [JS] Irean Hong, MD     ____________________________________________  FINAL CLINICAL IMPRESSION(S) / ED DIAGNOSES  Final diagnoses:  Left flank pain  Lower urinary tract infectious disease  Pyelonephritis     ED Discharge Orders          Ordered    cephALEXin (KEFLEX) 500 MG capsule  3 times daily        09/11/21 0530    ibuprofen (ADVIL) 800 MG tablet  Every 8 hours PRN        09/11/21 0530    HYDROcodone-acetaminophen (NORCO) 5-325 MG tablet  Every 6 hours PRN        09/11/21 0530    ondansetron (ZOFRAN ODT) 4 MG disintegrating tablet  Every 8 hours PRN        09/11/21 0530             Note:  This document was prepared using Dragon voice recognition software and may include unintentional dictation errors.    Irean Hong, MD 09/11/21 [phone]

## 2021-09-11 NOTE — ED Notes (Signed)
Pt to radiology for ct at this time  

## 2021-09-13 LAB — URINE CULTURE: Culture: >=100000 — AB

## 2021-09-16 LAB — CULTURE, BLOOD (ROUTINE X 2)
Culture: NO GROWTH
Culture: NO GROWTH
Special Requests: ADEQUATE
Special Requests: ADEQUATE

## 2021-09-19 ENCOUNTER — Other Ambulatory Visit: Payer: Self-pay

## 2021-09-19 ENCOUNTER — Emergency Department: Payer: BC Managed Care – PPO

## 2021-09-19 ENCOUNTER — Encounter: Payer: Self-pay | Admitting: Emergency Medicine

## 2021-09-19 ENCOUNTER — Emergency Department
Admission: EM | Admit: 2021-09-19 | Discharge: 2021-09-19 | Disposition: A | Discharge status: Home / Self Care | Payer: BC Managed Care – PPO | Attending: Emergency Medicine | Admitting: Emergency Medicine

## 2021-09-19 DIAGNOSIS — K219 Gastro-esophageal reflux disease without esophagitis: Secondary | ICD-10-CM | POA: Insufficient documentation

## 2021-09-19 DIAGNOSIS — Z87891 Personal history of nicotine dependence: Secondary | ICD-10-CM | POA: Diagnosis not present

## 2021-09-19 DIAGNOSIS — R1010 Upper abdominal pain, unspecified: Secondary | ICD-10-CM | POA: Insufficient documentation

## 2021-09-19 DIAGNOSIS — R112 Nausea with vomiting, unspecified: Secondary | ICD-10-CM | POA: Insufficient documentation

## 2021-09-19 DIAGNOSIS — N939 Abnormal uterine and vaginal bleeding, unspecified: Secondary | ICD-10-CM | POA: Diagnosis not present

## 2021-09-19 LAB — COMPREHENSIVE METABOLIC PANEL
ALT: 32 U/L (ref 0–44)
AST: 22 U/L (ref 15–41)
Albumin: 4.2 g/dL (ref 3.5–5.0)
Alkaline Phosphatase: 74 U/L (ref 38–126)
Anion gap: 9 (ref 5–15)
BUN: 12 mg/dL (ref 6–20)
CO2: 27 mmol/L (ref 22–32)
Calcium: 9.5 mg/dL (ref 8.9–10.3)
Chloride: 105 mmol/L (ref 98–111)
Creatinine, Ser: 0.83 mg/dL (ref 0.44–1.00)
GFR, Estimated: >60 mL/min (ref >60)
Glucose, Bld: 97 mg/dL (ref 70–99)
Potassium: 3.6 mmol/L (ref 3.5–5.1)
Sodium: 141 mmol/L (ref 135–145)
Total Bilirubin: 0.7 mg/dL (ref 0.3–1.2)
Total Protein: 7.8 g/dL (ref 6.5–8.1)

## 2021-09-19 LAB — URINALYSIS, COMPLETE (UACMP) WITH MICROSCOPIC
Bacteria, UA: NONE SEEN
Bilirubin Urine: NEGATIVE
Glucose, UA: NEGATIVE mg/dL
Ketones, ur: NEGATIVE mg/dL
Nitrite: NEGATIVE
Protein, ur: NEGATIVE mg/dL
Specific Gravity, Urine: 1.021 (ref 1.005–1.030)
pH: 6 (ref 5.0–8.0)

## 2021-09-19 LAB — CBC
HCT: 44.2 % (ref 36.0–46.0)
Hemoglobin: 14.5 g/dL (ref 12.0–15.0)
MCH: 28.3 pg (ref 26.0–34.0)
MCHC: 32.8 g/dL (ref 30.0–36.0)
MCV: 86.3 fL (ref 80.0–100.0)
Platelets: 349 10*3/uL (ref 150–400)
RBC: 5.12 MIL/uL — ABNORMAL HIGH (ref 3.87–5.11)
RDW: 13.6 % (ref 11.5–15.5)
WBC: 10.9 10*3/uL — ABNORMAL HIGH (ref 4.0–10.5)
nRBC: 0 % (ref 0.0–0.2)

## 2021-09-19 LAB — POC URINE PREG, ED: Preg Test, Ur: NEGATIVE

## 2021-09-19 LAB — LIPASE, BLOOD: Lipase: 34 U/L (ref 11–51)

## 2021-09-19 MED ORDER — PROMETHAZINE HCL 25 MG PO TABS
25.0000 mg | ORAL_TABLET | Freq: Once | ORAL | Status: AC
  Administered 2021-09-19: 25 mg via ORAL
  Filled 2021-09-19: qty 1

## 2021-09-19 MED ORDER — LIDOCAINE VISCOUS HCL 2 % MT SOLN
15.0000 mL | Freq: Once | OROMUCOSAL | Status: AC
  Administered 2021-09-19: 15 mL via ORAL
  Filled 2021-09-19: qty 15

## 2021-09-19 MED ORDER — PANTOPRAZOLE SODIUM 40 MG PO TBEC: 40.0000 mg | DELAYED_RELEASE_TABLET | Freq: Every day | ORAL | 1 refills | Status: DC

## 2021-09-19 MED ORDER — DICYCLOMINE HCL 20 MG PO TABS: 20.0000 mg | ORAL_TABLET | Freq: Three times a day (TID) | ORAL | 0 refills | Status: DC | PRN

## 2021-09-19 MED ORDER — ALUM & MAG HYDROXIDE-SIMETH 200-200-20 MG/5ML PO SUSP
30.0000 mL | Freq: Once | ORAL | Status: AC
  Administered 2021-09-19: 30 mL via ORAL
  Filled 2021-09-19: qty 30

## 2021-09-19 MED ORDER — PROMETHAZINE HCL 25 MG PO TABS
25.0000 mg | ORAL_TABLET | Freq: Four times a day (QID) | ORAL | 0 refills | Status: DC | PRN
Start: 2021-09-19 — End: 2022-01-16

## 2021-09-19 NOTE — ED Triage Notes (Signed)
Pt to ED from home c/o epigastric pain for the last week, pain intermittent but also worse after eating and vomiting after eating.  Emesis x4 today.  Has one more dose of ABX for UTI which she states urine symptoms resolving.  Chest rise even and unlabored, skin WNL, in NAD at this time.

## 2021-09-19 NOTE — ED Provider Notes (Signed)
[facility] Emergency Department Provider Note  ____________________________________________   Event Date/Time   First MD Initiated Contact with Patient 09/19/21 [phone]     (approximate)  I have reviewed the triage vital signs and the nursing notes.   HISTORY  Chief Complaint Abdominal Pain    HPI Gina Farmer is a 35 y.o. female with history of obesity, bipolar disorder, PTSD, GERD who presents to the emergency department with complaints of upper abdominal pain.  She describes it as a sharp pain that radiates into her shoulder blades.  She states she thinks it is her gallbladder because her family has had similar symptoms requiring cholecystectomy.  She has 4 episodes of nausea and vomiting today without blood.  States she felt worse after eating 2 hotdogs yesterday and then potatoes with butter tonight.  Has tried Zofran at home without relief.  Denies fevers, chills, diarrhea, dysuria, hematuria, vaginal discharge.  States she just recovered from influenza 2 weeks ago and just finished antibiotics for a kidney infection yesterday.  States she has had some vaginal spotting for 2 days.  She is status post previous appendectomy, bilateral salpingo-oophorectomy.  Denies any aggravating or alleviating factors.         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, left    Morbid obesity (HCC)    Post traumatic stress disorder (PTSD)    PTSD (post-traumatic stress disorder)     Patient Active Problem List   Diagnosis Date Noted   Endometriosis determined by laparoscopy 05/31/2020   Trichomoniasis 12/12/2019    Past Surgical History:  Procedure Laterality Date   APPENDECTOMY  2009   INTRAOCULAR LENS IMPLANT,  SECONDARY     from eye injury   LAPAROSCOPIC BILATERAL SALPINGO OOPHERECTOMY Bilateral 07/25/2020   Procedure: LAPAROSCOPIC BILATERAL SALPINGO OOPHORECTOMY;  Surgeon: Vena Austria, MD;  Location: [facility] ORS;  Service: Gynecology;  Laterality: Bilateral;   LAPAROSCOPY  2003   LAPAROSCOPY N/A 05/21/2020   Procedure: LAPAROSCOPY DIAGNOSTIC;  Surgeon: Vena Austria, MD;  Location: [facility] ORS;  Service: Gynecology;  Laterality: N/A;   SACROILIAC JOINT INJECTION      Prior to Admission medications   Medication Sig Start Date End Date Taking? Authorizing Provider  dicyclomine (BENTYL) 20 MG tablet Take 1 tablet (20 mg total) by mouth every 8 (eight) hours as needed for spasms (Abdominal cramping). 09/19/21  Yes Bethsaida Siegenthaler N, DO  pantoprazole (PROTONIX) 40 MG tablet Take 1 tablet (40 mg total) by mouth daily. 09/19/21 11/18/21 Yes Norissa Bartee, Layla Maw, DO  promethazine (PHENERGAN) 25 MG tablet Take 1 tablet (25 mg total) by mouth every 6 (six) hours as needed for nausea or vomiting. 09/19/21  Yes Raylynn Hersh, Baxter Hire N, DO  Albuterol Sulfate (PROAIR RESPICLICK) 108 (90 Base) MCG/ACT AEPB Inhale into the lungs. 08/04/21 08/04/22  [provider]  cephALEXin (KEFLEX) 500 MG capsule Take 1 capsule (500 mg total) by mouth 3 (three) times daily. 09/11/21   Irean Hong, MD  doxycycline (VIBRAMYCIN) 100 MG capsule Take 100 mg by mouth 2 (two) times daily. 08/14/21   [provider]  estradiol (ESTRACE) 1 MG tablet Take 1 tablet (1 mg total) by mouth  daily. 08/15/21   Vena Austria, MD  HYDROcodone-acetaminophen (NORCO) 5-325 MG tablet Take 1 tablet by mouth every 6 (six) hours as needed for moderate pain. 09/11/21   Irean Hong, MD  hydroxychloroquine (PLAQUENIL) 200 MG tablet Take 1 tablet by mouth 2 (two) times daily. 03/17/21   [provider]  ibuprofen (ADVIL) 800 MG tablet Take 1 tablet (800 mg total) by mouth every 8 (eight) hours as needed for moderate pain. 09/11/21   Irean Hong, MD   indomethacin (INDOCIN) 50 MG capsule Take 1 capsule (50 mg total) by mouth 3 (three) times daily as needed. 09/17/20   Joni Reining, PA-C  lidocaine (XYLOCAINE) 2 % jelly Apply topically 2 (two) times daily. 08/14/21 08/14/22  [provider]  mupirocin ointment (BACTROBAN) 2 % SMARTSIG:1 Application Topical 2-3 Times Daily 08/14/21   [provider]  ondansetron (ZOFRAN ODT) 4 MG disintegrating tablet Take 1 tablet (4 mg total) by mouth every 8 (eight) hours as needed for nausea or vomiting. 09/11/21   Irean Hong, MD  progesterone (PROMETRIUM) 100 MG capsule Take 1 capsule (100 mg total) by mouth daily. 08/15/21   Vena Austria, MD  propranolol (INDERAL) 20 MG tablet Take 20 mg by mouth 2 (two) times daily. 08/04/21   [provider]    Allergies Lamotrigine, Oxcarbazepine, and Viibryd [vilazodone hcl]  Family History  Problem Relation Age of Onset   Hypertension Father    Heart disease Father    Narcolepsy Father    Obesity Father    Hypertension Brother    Obesity Maternal Aunt    Ovarian cancer Maternal Aunt 40   Obesity Paternal Aunt    Heart disease Maternal Grandfather    Ovarian cancer Paternal Grandmother        41s   Uterine cancer Other     Social History Social History   Tobacco Use   Smoking status: Former    Packs/day: 0.25    Types: Cigarettes    Quit date: 12/19/2020    Years since quitting: 0.7   Smokeless tobacco: Never  Vaping Use   Vaping Use: Some days   Last attempt to quit: 05/14/2018   Substances: Nicotine, THC, Flavoring  Substance Use Topics   Alcohol use: Not Currently    Comment: social   Drug use: Yes    Types: Marijuana    Comment: occ    Review of Systems Constitutional: No fever. Eyes: No visual changes. ENT: No sore throat. Cardiovascular: Denies chest pain. Respiratory: Denies shortness of breath. Gastrointestinal: + Nausea and vomiting.  No diarrhea. Genitourinary: Negative for dysuria. Musculoskeletal:  Negative for back pain. Skin: Negative for rash. Neurological: Negative for focal weakness or numbness.  ____________________________________________   PHYSICAL EXAM:  VITAL SIGNS: ED Triage Vitals  Enc Vitals Group     BP 09/19/21 0253 (!) 136/91     Pulse Rate 09/19/21 0253 85     Resp 09/19/21 0253 20     Temp 09/19/21 0253 98.3 F (36.8 C)     Temp Source 09/19/21 0253 Oral     SpO2 09/19/21 0253 96 %     Weight 09/19/21 0301 (!) 305 lb (138.3 kg)     Height 09/19/21 0301 5\' 1"  (1.549 m)     Head Circumference --      Peak Flow --      Pain Score 09/19/21 0301 8     Pain Loc --      Pain Edu? --  Excl. in GC? --    CONSTITUTIONAL: Alert and oriented and responds appropriately to questions. Well-appearing; well-nourished, morbidly obese HEAD: Normocephalic EYES: Conjunctivae clear, pupils appear equal, EOM appear intact ENT: normal nose; moist mucous membranes NECK: Supple, normal ROM CARD: RRR; S1 and S2 appreciated; no murmurs, no clicks, no rubs, no gallops RESP: Normal chest excursion without splinting or tachypnea; breath sounds clear and equal bilaterally; no wheezes, no rhonchi, no rales, no hypoxia or respiratory distress, speaking full sentences ABD/GI: Normal bowel sounds; non-distended; soft, minimally tender throughout the upper abdomen, negative Murphy sign, no guarding or rebound BACK: The back appears normal EXT: Normal ROM in all joints; no deformity noted, no edema; no cyanosis SKIN: Normal color for age and race; warm; no rash on exposed skin NEURO: Moves all extremities equally PSYCH: The patient's mood and manner are appropriate.  ____________________________________________   LABS (all labs ordered are listed, but only abnormal results are displayed)  Labs Reviewed  CBC - Abnormal; Notable for the following components:      Result Value   WBC 10.9 (*)    RBC 5.12 (*)    All other components within normal limits  URINALYSIS, COMPLETE  (UACMP) WITH MICROSCOPIC - Abnormal; Notable for the following components:   Color, Urine YELLOW (*)    APPearance HAZY (*)    Hgb urine dipstick MODERATE (*)    Leukocytes,Ua TRACE (*)    All other components within normal limits  URINE CULTURE  LIPASE, BLOOD  COMPREHENSIVE METABOLIC PANEL  POC URINE PREG, ED   ____________________________________________  EKG    EKG Interpretation  Date/Time:  Friday September 19 2021 03:00:15 EDT Ventricular Rate:  79 PR Interval:  144 QRS Duration: 74 QT Interval:  372 QTC Calculation: 426 R Axis:   51 Text Interpretation: Normal sinus rhythm Normal ECG Confirmed by Rochele Raring [phone]) on 09/19/2021 4:28:38 AM         ____________________________________________  RADIOLOGY Normajean Baxter Doral Digangi, personally viewed and evaluated these images (plain radiographs) as part of my medical decision making, as well as reviewing the written report by the radiologist.  ED MD interpretation: Right upper quadrant ultrasound shows no acute abnormality.  Official radiology report(s): US ABDOMEN LIMITED RUQ (LIVER/GB)  Result Date: 09/19/2021 CLINICAL DATA:  35 year old female with 1 week of abdominal pain. EXAM: ULTRASOUND ABDOMEN LIMITED RIGHT UPPER QUADRANT COMPARISON:  Noncontrast CT Abdomen and Pelvis 09/11/2021 and earlier. FINDINGS: Gallbladder: No shadowing echogenic stones identified. There is a small chronic 4 mm gallbladder polyp seen in the lower body on image 17. This is unchanged from a 2018 ultrasound and inconsequential (ACR consensus guidelines: White Paper of the ACR Incidental Findings Committee II on Gallbladder and Biliary Findings. J Am Coll Radiol 2013:;10:953-956). Elsewhere gallbladder wall thickness is normal. No pericholecystic fluid. No sonographic Murphy sign elicited. Common bile duct: Diameter: 4 mm, normal. Liver: No focal lesion identified. Within normal limits in parenchymal echogenicity. Portal vein is patent on color Doppler  imaging with normal direction of blood flow towards the liver. Other: Negative visible right kidney. IMPRESSION: Negative right upper quadrant ultrasound. Electronically Signed   By: Odessa Fleming M.D.   On: 09/19/2021 06:05    ____________________________________________   PROCEDURES  Procedure(s) performed (including Critical Care):  Procedures   ____________________________________________   INITIAL IMPRESSION / ASSESSMENT AND PLAN / ED COURSE  As part of my medical decision making, I reviewed the following data within the electronic MEDICAL RECORD NUMBER Nursing notes reviewed and incorporated, Labs reviewed ,  EKG interpreted , Old EKG reviewed, Old chart reviewed, ultrasound reviewed, and Notes from prior ED visits         Patient here with complaints of upper abdominal pain, vomiting.  No chest pain or shortness of breath.  No fever.  Abdominal exam is relatively benign.  Patient is concerned that this is her gallbladder but she just had CT imaging on 09/11/2021 that showed no acute abnormality and specifically no gallstones.  We did discuss that CT can sometimes miss small stones and she would like to proceed with ultrasound especially given her family history.  Labs obtained in triage show no acute abnormality.  EKG nonischemic.  Will give GI cocktail, Phenergan.  ED PROGRESS  Patient reports some improvement in symptoms after medications and has been able to tolerate p.o. here.  Right upper quadrant ultrasound unremarkable.  Discussed with patient that this more is more likely gastritis, GERD.  Doubt colitis, diverticulitis, pancreatitis, perforation, bowel obstruction today.  I feel she is safe for discharge home.  She is comfortable with this plan.  She has a PCP for close outpatient follow-up.   At this time, I do not feel there is any life-threatening condition present. I have reviewed, interpreted and discussed all results (EKG, imaging, lab, urine as appropriate) and exam findings  with patient/family. I have reviewed nursing notes and appropriate previous records.  I feel the patient is safe to be discharged home without further emergent workup and can continue workup as an outpatient as needed. Discussed usual and customary return precautions. Patient/family verbalize understanding and are comfortable with this plan.  Outpatient follow-up has been provided as needed. All questions have been answered.  ____________________________________________   FINAL CLINICAL IMPRESSION(S) / ED DIAGNOSES  Final diagnoses:  Upper abdominal pain     ED Discharge Orders          Ordered    dicyclomine (BENTYL) 20 MG tablet  Every 8 hours PRN        09/19/21 0628    promethazine (PHENERGAN) 25 MG tablet  Every 6 hours PRN        09/19/21 0628    pantoprazole (PROTONIX) 40 MG tablet  Daily        09/19/21 5465            *Please note:  MYEASHA BALLOWE was evaluated in Emergency Department on 09/19/2021 for the symptoms described in the history of present illness. She was evaluated in the context of the global COVID-19 pandemic, which necessitated consideration that the patient might be at risk for infection with the SARS-CoV-2 virus that causes COVID-19. Institutional protocols and algorithms that pertain to the evaluation of patients at risk for COVID-19 are in a state of rapid change based on information released by regulatory bodies including the CDC and federal and state organizations. These policies and algorithms were followed during the patient's care in the ED.  Some ED evaluations and interventions may be delayed as a result of limited staffing during and the pandemic.*   Note:  This document was prepared using Dragon voice recognition software and may include unintentional dictation errors.    Mussa Groesbeck, Layla Maw, DO 09/19/21 [phone]

## 2021-09-20 LAB — URINE CULTURE: Culture: <10000 — AB

## 2021-10-18 ENCOUNTER — Other Ambulatory Visit: Payer: Self-pay

## 2021-10-18 ENCOUNTER — Encounter: Payer: Self-pay | Admitting: Emergency Medicine

## 2021-10-18 DIAGNOSIS — Z5321 Procedure and treatment not carried out due to patient leaving prior to being seen by health care provider: Secondary | ICD-10-CM | POA: Diagnosis not present

## 2021-10-18 DIAGNOSIS — L299 Pruritus, unspecified: Secondary | ICD-10-CM | POA: Insufficient documentation

## 2021-10-18 DIAGNOSIS — R21 Rash and other nonspecific skin eruption: Secondary | ICD-10-CM | POA: Insufficient documentation

## 2021-10-18 NOTE — ED Triage Notes (Signed)
Pt reports about 3 hours she noticed a rash to right thigh, reports no itchy sensation able to walk. Pt reports she took 50mg  of benadryl. Denies any changes in detergent. Pt talks in complete sentences no distress noted.

## 2021-10-19 ENCOUNTER — Emergency Department
Admission: EM | Admit: 2021-10-19 | Discharge: 2021-10-19 | Disposition: A | Discharge status: Home / Self Care | Payer: BC Managed Care – PPO | Attending: Emergency Medicine | Admitting: Emergency Medicine

## 2021-11-18 NOTE — Telephone Encounter (Signed)
Nexplanon rcvd 01/18/2020

## 2021-12-02 ENCOUNTER — Other Ambulatory Visit
Admission: RE | Admit: 2021-12-02 | Discharge: 2021-12-02 | Disposition: A | Discharge status: Home / Self Care | Payer: BC Managed Care – PPO | Source: Ambulatory Visit | Attending: Sports Medicine | Admitting: Sports Medicine

## 2021-12-02 DIAGNOSIS — M25562 Pain in left knee: Secondary | ICD-10-CM | POA: Insufficient documentation

## 2021-12-02 LAB — SYNOVIAL CELL COUNT + DIFF, W/ CRYSTALS
Crystals, Fluid: NONE SEEN
Eosinophils-Synovial: 0 %
Lymphocytes-Synovial Fld: 47 %
Monocyte-Macrophage-Synovial Fluid: 0 %
Neutrophil, Synovial: 53 %
WBC, Synovial: 11 /mm3 (ref 0–200)

## 2021-12-14 DIAGNOSIS — I82A19 Acute embolism and thrombosis of unspecified axillary vein: Secondary | ICD-10-CM

## 2021-12-14 DIAGNOSIS — I2699 Other pulmonary embolism without acute cor pulmonale: Secondary | ICD-10-CM

## 2021-12-14 DIAGNOSIS — I82402 Acute embolism and thrombosis of unspecified deep veins of left lower extremity: Secondary | ICD-10-CM

## 2021-12-14 HISTORY — DX: Acute embolism and thrombosis of unspecified deep veins of left lower extremity: I82.402

## 2021-12-14 HISTORY — DX: Other pulmonary embolism without acute cor pulmonale: I26.99

## 2021-12-14 HISTORY — DX: Acute embolism and thrombosis of unspecified axillary vein: I82.A19

## 2022-01-16 ENCOUNTER — Inpatient Hospital Stay
Admission: EM | Admit: 2022-01-16 | Discharge: 2022-01-17 | DRG: 299 | Disposition: A | Discharge status: Home / Self Care | Payer: BC Managed Care – PPO | Attending: Internal Medicine | Admitting: Internal Medicine

## 2022-01-16 ENCOUNTER — Emergency Department: Payer: BC Managed Care – PPO

## 2022-01-16 ENCOUNTER — Encounter: Payer: Self-pay | Admitting: Emergency Medicine

## 2022-01-16 ENCOUNTER — Other Ambulatory Visit: Payer: Self-pay

## 2022-01-16 DIAGNOSIS — Z8041 Family history of malignant neoplasm of ovary: Secondary | ICD-10-CM

## 2022-01-16 DIAGNOSIS — Z23 Encounter for immunization: Secondary | ICD-10-CM

## 2022-01-16 DIAGNOSIS — Z20822 Contact with and (suspected) exposure to covid-19: Secondary | ICD-10-CM | POA: Diagnosis present

## 2022-01-16 DIAGNOSIS — Z8249 Family history of ischemic heart disease and other diseases of the circulatory system: Secondary | ICD-10-CM | POA: Diagnosis not present

## 2022-01-16 DIAGNOSIS — I82442 Acute embolism and thrombosis of left tibial vein: Secondary | ICD-10-CM | POA: Diagnosis present

## 2022-01-16 DIAGNOSIS — I824Y2 Acute embolism and thrombosis of unspecified deep veins of left proximal lower extremity: Secondary | ICD-10-CM

## 2022-01-16 DIAGNOSIS — I82432 Acute embolism and thrombosis of left popliteal vein: Secondary | ICD-10-CM | POA: Diagnosis present

## 2022-01-16 DIAGNOSIS — Z8049 Family history of malignant neoplasm of other genital organs: Secondary | ICD-10-CM | POA: Diagnosis not present

## 2022-01-16 DIAGNOSIS — I2699 Other pulmonary embolism without acute cor pulmonale: Secondary | ICD-10-CM | POA: Diagnosis present

## 2022-01-16 DIAGNOSIS — Z79899 Other long term (current) drug therapy: Secondary | ICD-10-CM | POA: Diagnosis not present

## 2022-01-16 DIAGNOSIS — M79662 Pain in left lower leg: Secondary | ICD-10-CM | POA: Diagnosis present

## 2022-01-16 DIAGNOSIS — Z6841 Body Mass Index (BMI) 40.0 and over, adult: Secondary | ICD-10-CM

## 2022-01-16 DIAGNOSIS — M79605 Pain in left leg: Secondary | ICD-10-CM | POA: Diagnosis present

## 2022-01-16 DIAGNOSIS — Z87891 Personal history of nicotine dependence: Secondary | ICD-10-CM | POA: Diagnosis not present

## 2022-01-16 DIAGNOSIS — Z888 Allergy status to other drugs, medicaments and biological substances status: Secondary | ICD-10-CM

## 2022-01-16 DIAGNOSIS — I82412 Acute embolism and thrombosis of left femoral vein: Secondary | ICD-10-CM | POA: Diagnosis present

## 2022-01-16 DIAGNOSIS — F3181 Bipolar II disorder: Secondary | ICD-10-CM | POA: Diagnosis present

## 2022-01-16 DIAGNOSIS — I82409 Acute embolism and thrombosis of unspecified deep veins of unspecified lower extremity: Secondary | ICD-10-CM | POA: Diagnosis present

## 2022-01-16 DIAGNOSIS — F4310 Post-traumatic stress disorder, unspecified: Secondary | ICD-10-CM | POA: Diagnosis present

## 2022-01-16 DIAGNOSIS — K219 Gastro-esophageal reflux disease without esophagitis: Secondary | ICD-10-CM | POA: Diagnosis present

## 2022-01-16 DIAGNOSIS — E669 Obesity, unspecified: Secondary | ICD-10-CM | POA: Diagnosis present

## 2022-01-16 LAB — BASIC METABOLIC PANEL
Anion gap: 7 (ref 5–15)
BUN: 17 mg/dL (ref 6–20)
CO2: 24 mmol/L (ref 22–32)
Calcium: 8.5 mg/dL — ABNORMAL LOW (ref 8.9–10.3)
Chloride: 109 mmol/L (ref 98–111)
Creatinine, Ser: 0.74 mg/dL (ref 0.44–1.00)
GFR, Estimated: >60 mL/min (ref >60)
Glucose, Bld: 90 mg/dL (ref 70–99)
Potassium: 4.1 mmol/L (ref 3.5–5.1)
Sodium: 140 mmol/L (ref 135–145)

## 2022-01-16 LAB — PROTIME-INR
INR: 1 (ref 0.8–1.2)
Prothrombin Time: 13.5 seconds (ref 11.4–15.2)

## 2022-01-16 LAB — CBC WITH DIFFERENTIAL/PLATELET
Abs Immature Granulocytes: 0.06 10*3/uL (ref 0.00–0.07)
Basophils Absolute: 0 10*3/uL (ref 0.0–0.1)
Basophils Relative: 0 %
Eosinophils Absolute: 0.2 10*3/uL (ref 0.0–0.5)
Eosinophils Relative: 1 %
HCT: 44.5 % (ref 36.0–46.0)
Hemoglobin: 14.2 g/dL (ref 12.0–15.0)
Immature Granulocytes: 0 %
Lymphocytes Relative: 19 %
Lymphs Abs: 2.7 10*3/uL (ref 0.7–4.0)
MCH: 28.6 pg (ref 26.0–34.0)
MCHC: 31.9 g/dL (ref 30.0–36.0)
MCV: 89.5 fL (ref 80.0–100.0)
Monocytes Absolute: 1 10*3/uL (ref 0.1–1.0)
Monocytes Relative: 7 %
Neutro Abs: 10 10*3/uL — ABNORMAL HIGH (ref 1.7–7.7)
Neutrophils Relative %: 73 %
Platelets: 214 10*3/uL (ref 150–400)
RBC: 4.97 MIL/uL (ref 3.87–5.11)
RDW: 13.9 % (ref 11.5–15.5)
WBC: 13.9 10*3/uL — ABNORMAL HIGH (ref 4.0–10.5)
nRBC: 0 % (ref 0.0–0.2)

## 2022-01-16 LAB — RESP PANEL BY RT-PCR (FLU A&B, COVID) ARPGX2
Influenza A by PCR: NEGATIVE
Influenza B by PCR: NEGATIVE
SARS Coronavirus 2 by RT PCR: NEGATIVE

## 2022-01-16 LAB — TROPONIN I (HIGH SENSITIVITY): Troponin I (High Sensitivity): 5 ng/L (ref <18)

## 2022-01-16 LAB — APTT: aPTT: 63 seconds — ABNORMAL HIGH (ref 24–36)

## 2022-01-16 MED ORDER — ACETAMINOPHEN 650 MG RE SUPP: 650.0000 mg | Freq: Four times a day (QID) | RECTAL | Status: DC | PRN

## 2022-01-16 MED ORDER — HEPARIN (PORCINE) 25000 UT/250ML-% IV SOLN
1600.0000 [IU]/h | INTRAVENOUS | Status: DC
  Administered 2022-01-16: 1400 [IU]/h via INTRAVENOUS
  Administered 2022-01-17: 09:00:00 1600 [IU]/h via INTRAVENOUS
  Filled 2022-01-16 (×2): qty 250

## 2022-01-16 MED ORDER — HYDROCODONE-ACETAMINOPHEN 5-325 MG PO TABS
1.0000 | ORAL_TABLET | Freq: Four times a day (QID) | ORAL | Status: DC | PRN
Start: 2022-01-16 — End: 2022-01-17
  Administered 2022-01-16 – 2022-01-17 (×2): 1 via ORAL
  Filled 2022-01-16 (×2): qty 1

## 2022-01-16 MED ORDER — IOHEXOL 350 MG/ML SOLN
75.0000 mL | Freq: Once | INTRAVENOUS | Status: AC | PRN
  Administered 2022-01-16: 75 mL via INTRAVENOUS
  Filled 2022-01-16: qty 75

## 2022-01-16 MED ORDER — SODIUM CHLORIDE 0.9% FLUSH
3.0000 mL | Freq: Two times a day (BID) | INTRAVENOUS | Status: DC
  Administered 2022-01-17: 09:00:00 3 mL via INTRAVENOUS

## 2022-01-16 MED ORDER — SODIUM CHLORIDE 0.9 % IV SOLN: 250.0000 mL | INTRAVENOUS | Status: DC | PRN

## 2022-01-16 MED ORDER — SODIUM CHLORIDE 0.9% FLUSH: 3.0000 mL | INTRAVENOUS | Status: DC | PRN

## 2022-01-16 MED ORDER — PROPRANOLOL HCL 20 MG PO TABS
20.0000 mg | ORAL_TABLET | Freq: Two times a day (BID) | ORAL | Status: DC
Start: 2022-01-16 — End: 2022-01-17
  Administered 2022-01-16 – 2022-01-17 (×2): 20 mg via ORAL
  Filled 2022-01-16 (×3): qty 1

## 2022-01-16 MED ORDER — HEPARIN BOLUS VIA INFUSION
5000.0000 [IU] | Freq: Once | INTRAVENOUS | Status: AC
  Administered 2022-01-16: 5000 [IU] via INTRAVENOUS
  Filled 2022-01-16: qty 5000

## 2022-01-16 MED ORDER — ACETAMINOPHEN 325 MG PO TABS
650.0000 mg | ORAL_TABLET | Freq: Four times a day (QID) | ORAL | Status: DC | PRN
  Administered 2022-01-17: 650 mg via ORAL
  Filled 2022-01-16 (×2): qty 2

## 2022-01-16 NOTE — Assessment & Plan Note (Addendum)
A/c pe no obvious risk factor except for obesity.  hypercoagulable panel collected.

## 2022-01-16 NOTE — Assessment & Plan Note (Addendum)
Attribute to left leg DVT. PRN pain med.

## 2022-01-16 NOTE — Assessment & Plan Note (Signed)
Tsh/ ft4 and a1c.  Heart healthy diet.

## 2022-01-16 NOTE — ED Triage Notes (Signed)
Pt comes into the ED via POV c/o left lower ler pain that started 48 hours ago.  Pt has swelling and tightness present in the leg currently.  Pt sent over by Kc to r/o DVT.  Pt does states she has been sitting more than normal because she has been out of work this week.

## 2022-01-16 NOTE — Assessment & Plan Note (Signed)
Pt started on heparin drip.

## 2022-01-16 NOTE — H&P (Signed)
History and Physical    Patient: Gina Farmer FVC:[id number] DOB: 1986/07/31 DOA: 01/16/2022 DOS: the patient was seen and examined on 01/16/2022 PCP: Cyndia Diver, PA-C  Patient coming from: Home  Chief Complaint:  Chief Complaint  Patient presents with   Leg Pain    HPI: Gina Farmer is a 36 y.o. female seen in the emergency room with complaints of left leg pain.  Left leg pain.  Past 2 days patient sent over to rule out DVT.  Ambulating and weightbearing causes pain.  Review of Systems:  Review of Systems  Respiratory:  Positive for shortness of breath.   Cardiovascular:  Positive for chest pain and leg swelling.  All other systems reviewed and are negative.  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, left    Morbid obesity (HCC)    Post traumatic stress disorder (PTSD)    PTSD (post-traumatic stress disorder)    Past Surgical History:  Procedure Laterality Date   APPENDECTOMY  2009   INTRAOCULAR LENS IMPLANT, SECONDARY     from eye injury   LAPAROSCOPIC BILATERAL SALPINGO OOPHERECTOMY Bilateral 07/25/2020   Procedure: LAPAROSCOPIC BILATERAL SALPINGO OOPHORECTOMY;  Surgeon: Vena Austria, MD;  Location: [facility] ORS;  Service: Gynecology;  Laterality: Bilateral;   LAPAROSCOPY  2003   LAPAROSCOPY N/A 05/21/2020   Procedure: LAPAROSCOPY DIAGNOSTIC;  Surgeon: Vena Austria, MD;  Location: [facility] ORS;  Service: Gynecology;  Laterality: N/A;   SACROILIAC JOINT INJECTION     Social History:  reports that she quit smoking about 12 months ago. Her smoking use included cigarettes. She smoked an average of .25 packs per day. She has never used smokeless tobacco. She reports that she does not currently use alcohol. She reports  current drug use. Drug: Marijuana.  Allergies  Allergen Reactions   Lamotrigine Swelling   Oxcarbazepine Swelling and Other (See Comments)    Per pt, "leg swelling" Per pt, "leg swelling" Ankle swelling    Viibryd [Vilazodone Hcl] Swelling    Per pt "leg swelling"    Family History  Problem Relation Age of Onset   Hypertension Father    Heart disease Father    Narcolepsy Father    Obesity Father    Hypertension Brother    Obesity Maternal Aunt    Ovarian cancer Maternal Aunt 40   Obesity Paternal Aunt    Heart disease Maternal Grandfather    Ovarian cancer Paternal Grandmother        50s   Uterine cancer Other     Prior to Admission medications   Medication Sig Start Date End Date Taking? Authorizing Provider  Albuterol Sulfate (PROAIR RESPICLICK) 108 (90 Base) MCG/ACT AEPB Inhale into the lungs. 08/04/21 08/04/22  [provider]  cephALEXin (KEFLEX) 500 MG capsule Take 1 capsule (500 mg total) by mouth 3 (three) times daily. 09/11/21   Irean Hong, MD  dicyclomine (BENTYL) 20 MG tablet Take 1 tablet (20 mg total) by mouth every 8 (eight) hours as needed for spasms (Abdominal cramping). 09/19/21   Ward, Layla Maw, DO  doxycycline (VIBRAMYCIN) 100 MG capsule Take 100 mg by mouth 2 (two) times daily. 08/14/21   [provider]  estradiol (ESTRACE) 1 MG tablet Take 1 tablet (1 mg total) by mouth daily. 08/15/21   Vena AustriaStaebler, Andreas, MD  HYDROcodone-acetaminophen (NORCO) 5-325 MG tablet Take 1 tablet by mouth every 6 (six) hours as needed for moderate pain. 09/11/21   Irean HongSung, Jade J, MD  hydroxychloroquine (PLAQUENIL) 200 MG tablet Take 1 tablet by mouth 2 (two) times daily. 03/17/21   [provider]  ibuprofen (ADVIL) 800 MG tablet Take 1 tablet (800 mg total) by mouth every 8 (eight) hours as needed for moderate pain. 09/11/21   Irean HongSung, Jade J, MD  indomethacin (INDOCIN) 50 MG capsule Take 1 capsule (50 mg total) by mouth 3 (three) times daily as needed. 09/17/20    Joni ReiningSmith, Ronald K, PA-C  lidocaine (XYLOCAINE) 2 % jelly Apply topically 2 (two) times daily. 08/14/21 08/14/22  [provider]  mupirocin ointment (BACTROBAN) 2 % SMARTSIG:1 Application Topical 2-3 Times Daily 08/14/21   [provider]  ondansetron (ZOFRAN ODT) 4 MG disintegrating tablet Take 1 tablet (4 mg total) by mouth every 8 (eight) hours as needed for nausea or vomiting. 09/11/21   Irean HongSung, Jade J, MD  pantoprazole (PROTONIX) 40 MG tablet Take 1 tablet (40 mg total) by mouth daily. 09/19/21 11/18/21  Ward, Layla MawKristen N, DO  progesterone (PROMETRIUM) 100 MG capsule Take 1 capsule (100 mg total) by mouth daily. 08/15/21   Vena AustriaStaebler, Andreas, MD  promethazine (PHENERGAN) 25 MG tablet Take 1 tablet (25 mg total) by mouth every 6 (six) hours as needed for nausea or vomiting. 09/19/21   Ward, Layla MawKristen N, DO  propranolol (INDERAL) 20 MG tablet Take 20 mg by mouth 2 (two) times daily. 08/04/21   [provider]    Physical Exam: Vitals:   01/16/22 1322 01/16/22 1356  BP:  134/89  Pulse:  70  Resp:  16  Temp:  98.7 F (37.1 C)  TempSrc:  Oral  SpO2:  98%  Weight: (!) 138.8 kg   Height: 5\' 1"  (1.549 m)   Emergency room patient is afebrile oxygenating 98% on room air.   Labs shows normal BMP and cbc shows wbc count of 13.9. Physical Exam Vitals reviewed.  Constitutional:      General: She is not in acute distress.    Appearance: She is obese. She is not ill-appearing.  HENT:     Head: Normocephalic and atraumatic.     Right Ear: External ear normal.     Left Ear: External ear normal.     Nose: Nose normal.     Mouth/Throat:     Mouth: Mucous membranes are moist.  Eyes:     Extraocular Movements: Extraocular movements intact.     Pupils: Pupils are equal, round, and reactive to light.  Neck:     Vascular: No carotid bruit.  Cardiovascular:     Rate and Rhythm: Normal rate and regular rhythm.     Pulses: Normal pulses.     Heart sounds: Normal heart sounds.  Pulmonary:      Effort: Pulmonary effort is normal.     Breath sounds: Normal breath sounds.  Abdominal:     General: Bowel sounds are normal.     Palpations: Abdomen is soft.  Musculoskeletal:     Right lower leg: No edema.     Left lower leg: No edema.  Skin:    General: Skin is warm.  Neurological:     General: No focal deficit present.     Mental Status: She is alert.  Psychiatric:  Mood and Affect: Mood normal.        Behavior: Behavior normal.   Data Reviewed:  Assessment and Plan: Obesity (BMI 30-39.9)- (present on admission) Tsh/ ft4 and a1c.  Heart healthy diet.   Acute pulmonary embolism (HCC)- (present on admission) A/c pe no obvious risk factor except for obesity.  hypercoagulable panel collected.   DVT (deep venous thrombosis) (HCC)- (present on admission) Pt started on heparin drip.  Left leg pain- (present on admission) Attribute to left leg DVT. PRN pain med.     Advance Care Planning:   Code Status: Full Code full code   Consults: None   Family Communication: Rogowski,Brent (Brother)  [phone] (Home Phone)  Severity of Illness: The appropriate patient status for this patient is INPATIENT. Inpatient status is judged to be reasonable and necessary in order to provide the required intensity of service to ensure the patient's safety. The patient's presenting symptoms, physical exam findings, and initial radiographic and laboratory data in the context of their chronic comorbidities is felt to place them at high risk for further clinical deterioration. Furthermore, it is not anticipated that the patient will be medically stable for discharge from the hospital within 2 midnights of admission.   * I certify that at the point of admission it is my clinical judgment that the patient will require inpatient hospital care spanning beyond 2 midnights from the point of admission due to high intensity of service, high risk for further deterioration and high frequency of  surveillance required.*  Author: Gertha Calkin, MD 01/16/2022 7:48 PM  For on call review [email or web address].

## 2022-01-16 NOTE — ED Provider Notes (Signed)
[facility] Provider Note    Event Date/Time   First MD Initiated Contact with Patient 01/16/22 1413     (approximate)   History   Chief Complaint Leg Pain   HPI Gina Farmer is a 36 y.o. female, with bipolar 2, anxiety, GERD, presents to the emergency department for evaluation of leg pain.  Patient states that her left lower extremity began hurting approximately 2 days ago.  Reports feeling a tight sensation around her knee extending into her calf.  Reports pain when walking and with flexion of her foot.  Denies any recent injury or illness denies fever/chills, chest pain, shortness of breath, back pain, headache, nausea/vomiting, abdominal pain, flank pain, or urinary symptoms.  History Limitations: No limitations      Physical Exam  Triage Vital Signs: ED Triage Vitals  Enc Vitals Group     BP 01/16/22 1356 134/89     Pulse Rate 01/16/22 1356 70     Resp 01/16/22 1356 16     Temp 01/16/22 1356 98.7 F (37.1 C)     Temp Source 01/16/22 1356 Oral     SpO2 01/16/22 1356 98 %     Weight 01/16/22 1322 (!) 306 lb (138.8 kg)     Height 01/16/22 1322 5\' 1"  (1.549 m)     Head Circumference --      [place] --      Pain Score 01/16/22 1322 9     Pain Loc --      Pain Edu? --      Excl. in [place]? --     Most recent vital signs: Vitals:   01/16/22 1356  BP: 134/89  Pulse: 70  Resp: 16  Temp: 98.7 F (37.1 C)  SpO2: 98%    General: Awake, NAD.  CV: Good peripheral perfusion.  Resp: Normal effort.  Abd: Soft, non-tender. No distention.  Neuro: At baseline. No gross neurological deficits. Other: No gross deformities in left lower extremity.  Patient has very large body habitus, though no notable increased swelling in the left lower extremity compared to the right.  No surrounding erythema.  No localized tenderness, though patient does have a positive Homan sign.  Patient has baseline neuropathy, but still preserved sensation in both lower  extremities.  2+ pulses intact distally.  Motor function intact.  Patient has normal range of motion in left lower extremity.  Physical Exam    ED Results / Procedures / Treatments  Labs (all labs ordered are listed, but only abnormal results are displayed) Labs Reviewed  CBC WITH DIFFERENTIAL/PLATELET - Abnormal; Notable for the following components:      Result Value   WBC 13.9 (*)    Neutro Abs 10.0 (*)    All other components within normal limits  BASIC METABOLIC PANEL - Abnormal; Notable for the following components:   Calcium 8.5 (*)    All other components within normal limits  RESP PANEL BY RT-PCR (FLU A&B, COVID) ARPGX2  ANTITHROMBIN III  PROTEIN C ACTIVITY  PROTEIN C, TOTAL  PROTEIN S ACTIVITY  PROTEIN S, TOTAL  LUPUS ANTICOAGULANT PANEL  BETA-2-GLYCOPROTEIN I ABS, IGG/M/A  HOMOCYSTEINE  FACTOR 5 LEIDEN  PROTHROMBIN GENE MUTATION  CARDIOLIPIN ANTIBODIES, IGG, IGM, IGA  APTT  PROTIME-INR  CBC  HEPARIN LEVEL (UNFRACTIONATED)  TROPONIN I (HIGH SENSITIVITY)     EKG Not applicable   RADIOLOGY  ED Provider Interpretation:   CT Angio Chest PE W/Cm &/Or Wo Cm  Result Date: 01/16/2022  CLINICAL DATA:  Pulmonary embolism (PE) suspected, high prob Left leg DVT. EXAM: CT ANGIOGRAPHY CHEST WITH CONTRAST TECHNIQUE: Multidetector CT imaging of the chest was performed using the standard protocol during bolus administration of intravenous contrast. Multiplanar CT image reconstructions and MIPs were obtained to evaluate the vascular anatomy. RADIATION DOSE REDUCTION: This exam was performed according to the departmental dose-optimization program which includes automated exposure control, adjustment of the mA and/or kV according to patient size and/or use of iterative reconstruction technique. CONTRAST:  5mL OMNIPAQUE IOHEXOL 350 MG/ML SOLN COMPARISON:  Chest radiograph 06/06/2021, no recent chest imaging available. FINDINGS: Cardiovascular: Examination is positive for acute  pulmonary embolus with filling defects in the segmental branches of the right lower lobe, multiple right lower lobe subsegmental branches. There is also subsegmental pulmonary emboli in the anterior right upper lobe pulmonary arteries. Thromboembolic burden is small. No left-sided pulmonary emboli. No right heart strain, RV to LV ratio 0.84. The heart is normal in size. The thoracic aorta is normal in caliber. No pericardial effusion. Mediastinum/Nodes: Enlarged mediastinal, hilar, or axillary lymph nodes. No esophageal wall thickening. No visualized thyroid nodule. Lungs/Pleura: No pulmonary infarct or focal airspace disease. The lungs are clear. No pleural effusion. Trachea and central bronchi are patent. Upper Abdomen: No acute upper abdominal findings. Musculoskeletal: There are no acute or suspicious osseous abnormalities. Review of the MIP images confirms the above findings. IMPRESSION: 1. Examination is positive for acute pulmonary embolus. Filling defects within the segmental right lower lobe and submental right upper and lower lobe branches. Small volume clot burden. No right heart strain. 2. No pulmonary infarct or focal airspace disease. Critical Value/emergent results were called by telephone at the time of interpretation on 01/16/2022 at 6:35 pm to provider [facility] , who verbally acknowledged these results. Electronically Signed   By: Keith Rake M.D.   On: 01/16/2022 18:36   US Venous Img Lower Unilateral Left  Result Date: 01/16/2022 CLINICAL DATA:  Pain and swelling EXAM: Left LOWER EXTREMITY VENOUS DOPPLER ULTRASOUND TECHNIQUE: Gray-scale sonography with compression, as well as color and duplex ultrasound, were performed to evaluate the deep venous system(s) from the level of the common femoral vein through the popliteal and proximal calf veins. COMPARISON:  None. FINDINGS: VENOUS There is evidence of acute occlusive DVT involving the caudal course of left femoral vein, left popliteal  vein and posterior tibial vein. Limited views of the contralateral common femoral vein are unremarkable. OTHER None. Limitations: none IMPRESSION: There is acute occlusive DVT involving the caudal course of left femoral vein, left popliteal vein and left posterior tibial vein. Electronically Signed   By: Elmer Picker M.D.   On: 01/16/2022 17:31    PROCEDURES:  Critical Care performed: None  Procedures    MEDICATIONS ORDERED IN ED: Medications  heparin bolus via infusion 5,000 Units (has no administration in time range)  heparin ADULT infusion 100 units/mL (25000 units/240mL) (has no administration in time range)  iohexol (OMNIPAQUE) 350 MG/ML injection 75 mL (75 mLs Intravenous Contrast Given 01/16/22 1812)     IMPRESSION / MDM / ASSESSMENT AND PLAN / ED COURSE  I reviewed the triage vital signs and the nursing notes.                              Gina Farmer is a 36 y.o. female, with bipolar 2, anxiety, GERD, presents to the emergency department for evaluation of leg pain.  Patient states  that her left lower extremity began hurting approximately 2 days ago.  Reports feeling a tight sensation around her knee extending into her calf.  Reports pain when walking and with flexion of her foot.   Differential diagnosis includes, but is not limited to, DVT, cellulitis, PAD, gastrocnemius tear, Baker's cyst, PE.   ED Course CBC notable for leukocytosis at 13.9.  No anemia present.  BMP shows no remarkable electrolyte abnormalities or evidence of kidney injury.  Ultrasound shows acute occlusive DVT involving the caudal course of left femoral vein, left popliteal vein, and left posterior tibial vein.  Upon further discussion with the patient, she states that she has also endorsed some shortness of breath over the past couple days as well.  We will order CT to evaluate for pulmonary embolism.  CT scan shows acute pulmonary embolism.  No right heart strain.  We will go ahead and start  heparin and consult hospitalist for admission.   Assessment/Plan History, physical exam, and work-up thus far consistent with DVT and pulmonary embolism, confirmed by ultrasound and CT angio.  Patient is currently stable and comfortable at this time.  Heparin initiated.  Spoke with the hospitalist, Dr. Posey Pronto, who agreed to admission.      FINAL CLINICAL IMPRESSION(S) / ED DIAGNOSES   Final diagnoses:  None     Rx / DC Orders   ED Discharge Orders     None        Note:  This document was prepared using Dragon voice recognition software and may include unintentional dictation errors.   Teodoro Spray, [place] 01/16/22 1940    Vanessa [place], MD 01/17/22 1154

## 2022-01-16 NOTE — Consult Note (Signed)
ANTICOAGULATION CONSULT NOTE -  Consult  Pharmacy Consult for Heparin gtt Indication: LLE DVT and acute pulmonary embolus  Allergies  Allergen Reactions   Lamotrigine Swelling   Oxcarbazepine Swelling and Other (See Comments)    Per pt, "leg swelling" Per pt, "leg swelling" Ankle swelling    Viibryd [Vilazodone Hcl] Swelling    Per pt "leg swelling"    Patient Measurements: Height: 5\' 1"  (154.9 cm) Weight: (!) 138.8 kg (306 lb) IBW/kg (Calculated) : 47.8 Heparin Dosing Weight: 83.5kg  Vital Signs: Temp: 98.7 F (37.1 C) (02/03 1356) Temp Source: Oral (02/03 1356) BP: 134/89 (02/03 1356) Pulse Rate: 70 (02/03 1356)  Labs: Recent Labs    01/16/22 1532  HGB 14.2  HCT 44.5  PLT 214  CREATININE 0.74    Estimated Creatinine Clearance: 129.2 mL/min (by C-G formula based on SCr of 0.74 mg/dL).   Medications: Heparin Dosing Weight: 83.5kg PTA: No AC/APT PTA & no AC/APT pertinent drug allergies. Inpatient: +heparin gtt  Assessment:  36 y.o. female, with bipolar 2, anxiety, GERD, presents to the emergency department for evaluation of leg pain.  Patient states that her left lower extremity began hurting approximately 2 days ago. Undergoing hypercoagulability work-up (Pending: cardiolipin, prothrombin gene mut, FVL, homocysteine, B2g, Lupus AC, Protein C/S, & ATIII). Pharmacy consulted for mgmt of heparin gtt.  Date Time aPTT/HL Rate/Comment       Baseline Labs: aPTT - ordered INR - ordered Hgb - 14.2 Plts - 214 Troponin - pending  Goal of Therapy:  Heparin level 0.3-0.7 units/ml Monitor platelets by anticoagulation protocol: Yes   Plan:  Give 5000 units bolus x1; then start heparin infusion at 1400 units/hr Check anti-Xa level in 6hours and daily once consecutively therapeutic. Continue to monitor H&H and platelets daily while on heparin gtt.  31, PharmD, [facility] Clinical Pharmacist 01/16/2022 7:29 PM

## 2022-01-17 ENCOUNTER — Inpatient Hospital Stay: Admit: 2022-01-17 | Payer: BC Managed Care – PPO

## 2022-01-17 ENCOUNTER — Encounter: Payer: Self-pay | Admitting: Internal Medicine

## 2022-01-17 DIAGNOSIS — I2699 Other pulmonary embolism without acute cor pulmonale: Secondary | ICD-10-CM

## 2022-01-17 DIAGNOSIS — I82412 Acute embolism and thrombosis of left femoral vein: Principal | ICD-10-CM

## 2022-01-17 LAB — CBC
HCT: 43.5 % (ref 36.0–46.0)
Hemoglobin: 13.7 g/dL (ref 12.0–15.0)
MCH: 28.5 pg (ref 26.0–34.0)
MCHC: 31.5 g/dL (ref 30.0–36.0)
MCV: 90.4 fL (ref 80.0–100.0)
Platelets: 205 10*3/uL (ref 150–400)
RBC: 4.81 MIL/uL (ref 3.87–5.11)
RDW: 13.9 % (ref 11.5–15.5)
WBC: 13.4 10*3/uL — ABNORMAL HIGH (ref 4.0–10.5)
nRBC: 0 % (ref 0.0–0.2)

## 2022-01-17 LAB — HEPARIN LEVEL (UNFRACTIONATED)
Heparin Unfractionated: 0.21 IU/mL — ABNORMAL LOW (ref 0.30–0.70)
Heparin Unfractionated: 0.23 IU/mL — ABNORMAL LOW (ref 0.30–0.70)

## 2022-01-17 LAB — COMPREHENSIVE METABOLIC PANEL
ALT: 18 U/L (ref 0–44)
AST: 13 U/L — ABNORMAL LOW (ref 15–41)
Albumin: 3 g/dL — ABNORMAL LOW (ref 3.5–5.0)
Alkaline Phosphatase: 53 U/L (ref 38–126)
Anion gap: 6 (ref 5–15)
BUN: 21 mg/dL — ABNORMAL HIGH (ref 6–20)
CO2: 29 mmol/L (ref 22–32)
Calcium: 8.7 mg/dL — ABNORMAL LOW (ref 8.9–10.3)
Chloride: 107 mmol/L (ref 98–111)
Creatinine, Ser: 0.72 mg/dL (ref 0.44–1.00)
GFR, Estimated: >60 mL/min (ref >60)
Glucose, Bld: 136 mg/dL — ABNORMAL HIGH (ref 70–99)
Potassium: 3.8 mmol/L (ref 3.5–5.1)
Sodium: 142 mmol/L (ref 135–145)
Total Bilirubin: 0.4 mg/dL (ref 0.3–1.2)
Total Protein: 5.9 g/dL — ABNORMAL LOW (ref 6.5–8.1)

## 2022-01-17 LAB — ANTITHROMBIN III: AntiThromb III Func: 117 % (ref 75–120)

## 2022-01-17 LAB — HIV ANTIBODY (ROUTINE TESTING W REFLEX): HIV Screen 4th Generation wRfx: NONREACTIVE

## 2022-01-17 MED ORDER — APIXABAN 5 MG PO TABS: 5.0000 mg | ORAL_TABLET | Freq: Two times a day (BID) | ORAL | Status: DC

## 2022-01-17 MED ORDER — APIXABAN 5 MG PO TABS
10.0000 mg | ORAL_TABLET | Freq: Two times a day (BID) | ORAL | Status: DC
  Administered 2022-01-17: 10:00:00 10 mg via ORAL
  Filled 2022-01-17: qty 2

## 2022-01-17 MED ORDER — INFLUENZA VAC A&B SA ADJ QUAD 0.5 ML IM PRSY: 0.5000 mL | PREFILLED_SYRINGE | INTRAMUSCULAR | Status: DC

## 2022-01-17 MED ORDER — COVID-19MRNA BIVAL VACC PFIZER 30 MCG/0.3ML IM SUSP: 0.3000 mL | Freq: Once | INTRAMUSCULAR | Status: DC

## 2022-01-17 MED ORDER — APIXABAN 5 MG PO TABS: 5.0000 mg | ORAL_TABLET | Freq: Two times a day (BID) | ORAL | 0 refills | Status: DC

## 2022-01-17 MED ORDER — ACETAMINOPHEN 325 MG PO TABS: 650.0000 mg | ORAL_TABLET | Freq: Four times a day (QID) | ORAL | Status: AC | PRN

## 2022-01-17 MED ORDER — APIXABAN 5 MG PO TABS: 10.0000 mg | ORAL_TABLET | Freq: Two times a day (BID) | ORAL | 0 refills | Status: DC

## 2022-01-17 MED ORDER — INFLUENZA VAC A&B SA ADJ QUAD 0.5 ML IM PRSY
0.5000 mL | PREFILLED_SYRINGE | INTRAMUSCULAR | Status: AC
  Administered 2022-01-17: 0.5 mL via INTRAMUSCULAR
  Filled 2022-01-17: qty 0.5

## 2022-01-17 MED ORDER — HEPARIN BOLUS VIA INFUSION
1250.0000 [IU] | Freq: Once | INTRAVENOUS | Status: AC
  Administered 2022-01-17: 03:00:00 1250 [IU] via INTRAVENOUS
  Filled 2022-01-17: qty 1250

## 2022-01-17 MED ORDER — COVID-19MRNA BIVAL VACC PFIZER 30 MCG/0.3ML IM SUSP
0.3000 mL | Freq: Once | INTRAMUSCULAR | Status: AC
  Administered 2022-01-17: 14:00:00 0.3 mL via INTRAMUSCULAR
  Filled 2022-01-17: qty 0.3

## 2022-01-17 NOTE — Consult Note (Signed)
ANTICOAGULATION CONSULT NOTE -  Consult  Pharmacy Consult for transition from a heparin gtt to apixaban Indication: LLE DVT and acute pulmonary embolus  Patient Measurements: Height: 5\' 1"  (154.9 cm) Weight: (!) 140.6 kg (309 lb 15.5 oz) IBW/kg (Calculated) : 47.8  Vital Signs: Temp: 98.7 F (37.1 C) (02/04 0816) Temp Source: Oral (02/04 0816) BP: 125/81 (02/04 0816) Pulse Rate: 72 (02/04 0816)  Labs: Recent Labs    01/16/22 1532 01/16/22 1917 01/16/22 2158 01/17/22 0209  HGB 14.2  --   --  13.7  HCT 44.5  --   --  43.5  PLT 214  --   --  205  APTT  --   --  63*  --   LABPROT  --   --  13.5  --   INR  --   --  1.0  --   HEPARINUNFRC  --   --   --  0.21*  CREATININE 0.74  --   --  0.72  TROPONINIHS  --  5  --   --      Estimated Creatinine Clearance: 130.3 mL/min (by C-G formula based on SCr of 0.72 mg/dL).  Assessment:  36 y.o. female, with bipolar 2, anxiety, GERD, presents to the emergency department for evaluation of leg pain.  Patient states that her left lower extremity began hurting approximately 2 days ago. Pharmacy consulted for transition from  heparin gtt (started 01/16/22 pm) to apixaban. H&H, platelets wnl  Goal of Therapy:  Monitor platelets by anticoagulation protocol: Yes   Plan:  Stop heparin infusion Start apixaban 10 mg twice daily for 7 days followed by 5 mg twice daily Follow CBC per protocol  Dallie Piles Clinical Pharmacist 01/17/2022 9:28 AM

## 2022-01-17 NOTE — Discharge Summary (Addendum)
Physician Discharge Summary  Gina Farmer ZOX:[id number] DOB: 03-12-1986 DOA: 01/16/2022  PCP: Cyndia DiverBounvilay, Celena, PA-C  Admit date: 01/16/2022 Discharge date: 01/17/2022  Discharge disposition: Home   Recommendations for Outpatient Follow-Up:   Follow-up with PCP in 1 week   Discharge Diagnosis:   Principal Problem:   Acute pulmonary embolism (HCC) Active Problems:   Left leg pain   DVT (deep venous thrombosis) (HCC)   Obesity (BMI 30-39.9)    Discharge Condition: Stable.  Diet recommendation:  Diet Order             Diet - low sodium heart healthy           Diet Heart Room service appropriate? Yes; Fluid consistency: Thin  Diet effective now                     Code Status: Full Code     Hospital Course:   Gina Farmer is a 36 year old woman with medical history significant for morbid obesity, bipolar disorder, depression, GERD, PTSD, anxiety, back injury, who presented to the hospital because of left leg pain and swelling.  He was found to have left lower extremity DVT and acute pulmonary embolism.  She was treated with analgesics and IV heparin.  IV heparin was transitioned to Eliquis.  Risks and benefits of long-term anticoagulation were discussed with the patient and she agreed to proceed with treatment with Eliquis.  Her condition has improved.  She is able to ambulate without any problems and she is tolerating room air.  She is deemed stable for discharge to home today.     Discharge Exam:    Vitals:   01/17/22 0004 01/17/22 0522 01/17/22 0816 01/17/22 1151  BP: 115/70 113/77 125/81 122/77  Pulse: 69 70 72 (!) 55  Resp: 18 20 18 18   Temp: 98.4 F (36.9 C) 98.6 F (37 C) 98.7 F (37.1 C) 98 F (36.7 C)  TempSrc: Oral Oral Oral   SpO2: 98% 97% 100% 98%  Weight:      Height:         GEN: NAD SKIN: Warm and dry EYES: No pallor or icterus ENT: MMM CV: RRR PULM: CTA B ABD: soft, obese, NT, +BS CNS: AAO x 3, non focal EXT: Mild  swelling and tenderness of the left leg   The results of significant diagnostics from this hospitalization (including imaging, microbiology, ancillary and laboratory) are listed below for reference.     Procedures and Diagnostic Studies:   CT Angio Chest PE W/Cm &/Or Wo Cm  Result Date: 01/16/2022 CLINICAL DATA:  Pulmonary embolism (PE) suspected, high prob Left leg DVT. EXAM: CT ANGIOGRAPHY CHEST WITH CONTRAST TECHNIQUE: Multidetector CT imaging of the chest was performed using the standard protocol during bolus administration of intravenous contrast. Multiplanar CT image reconstructions and MIPs were obtained to evaluate the vascular anatomy. RADIATION DOSE REDUCTION: This exam was performed according to the departmental dose-optimization program which includes automated exposure control, adjustment of the mA and/or kV according to patient size and/or use of iterative reconstruction technique. CONTRAST:  75mL OMNIPAQUE IOHEXOL 350 MG/ML SOLN COMPARISON:  Chest radiograph 06/06/2021, no recent chest imaging available. FINDINGS: Cardiovascular: Examination is positive for acute pulmonary embolus with filling defects in the segmental branches of the right lower lobe, multiple right lower lobe subsegmental branches. There is also subsegmental pulmonary emboli in the anterior right upper lobe pulmonary arteries. Thromboembolic burden is small. No left-sided pulmonary emboli. No right heart strain, RV to  LV ratio 0.84. The heart is normal in size. The thoracic aorta is normal in caliber. No pericardial effusion. Mediastinum/Nodes: Enlarged mediastinal, hilar, or axillary lymph nodes. No esophageal wall thickening. No visualized thyroid nodule. Lungs/Pleura: No pulmonary infarct or focal airspace disease. The lungs are clear. No pleural effusion. Trachea and central bronchi are patent. Upper Abdomen: No acute upper abdominal findings. Musculoskeletal: There are no acute or suspicious osseous abnormalities.  Review of the MIP images confirms the above findings. IMPRESSION: 1. Examination is positive for acute pulmonary embolus. Filling defects within the segmental right lower lobe and submental right upper and lower lobe branches. Small volume clot burden. No right heart strain. 2. No pulmonary infarct or focal airspace disease. Critical Value/emergent results were called by telephone at the time of interpretation on 01/16/2022 at 6:35 pm to provider [facility] , who verbally acknowledged these results. Electronically Signed   By: Narda RutherfordMelanie  Sanford M.D.   On: 01/16/2022 18:36   US Venous Img Lower Unilateral Left  Result Date: 01/16/2022 CLINICAL DATA:  Pain and swelling EXAM: Left LOWER EXTREMITY VENOUS DOPPLER ULTRASOUND TECHNIQUE: Gray-scale sonography with compression, as well as color and duplex ultrasound, were performed to evaluate the deep venous system(s) from the level of the common femoral vein through the popliteal and proximal calf veins. COMPARISON:  None. FINDINGS: VENOUS There is evidence of acute occlusive DVT involving the caudal course of left femoral vein, left popliteal vein and posterior tibial vein. Limited views of the contralateral common femoral vein are unremarkable. OTHER None. Limitations: none IMPRESSION: There is acute occlusive DVT involving the caudal course of left femoral vein, left popliteal vein and left posterior tibial vein. Electronically Signed   By: Ernie AvenaPalani  Rathinasamy M.D.   On: 01/16/2022 17:31     Labs:   Basic Metabolic Panel: Recent Labs  Lab 01/16/22 1532 01/17/22 0209  NA 140 142  K 4.1 3.8  CL 109 107  CO2 24 29  GLUCOSE 90 136*  BUN 17 21*  CREATININE 0.74 0.72  CALCIUM 8.5* 8.7*   GFR Estimated Creatinine Clearance: 130.3 mL/min (by C-G formula based on SCr of 0.72 mg/dL). Liver Function Tests: Recent Labs  Lab 01/17/22 0209  AST 13*  ALT 18  ALKPHOS 53  BILITOT 0.4  PROT 5.9*  ALBUMIN 3.0*   No results for input(s): LIPASE,  AMYLASE in the last 168 hours. No results for input(s): AMMONIA in the last 168 hours. Coagulation profile Recent Labs  Lab 01/16/22 2158  INR 1.0    CBC: Recent Labs  Lab 01/16/22 1532 01/17/22 0209  WBC 13.9* 13.4*  NEUTROABS 10.0*  --   HGB 14.2 13.7  HCT 44.5 43.5  MCV 89.5 90.4  PLT 214 205   Cardiac Enzymes: No results for input(s): CKTOTAL, CKMB, CKMBINDEX, TROPONINI in the last 168 hours. BNP: Invalid input(s): POCBNP CBG: No results for input(s): GLUCAP in the last 168 hours. D-Dimer No results for input(s): DDIMER in the last 72 hours. Hgb A1c No results for input(s): HGBA1C in the last 72 hours. Lipid Profile No results for input(s): CHOL, HDL, LDLCALC, TRIG, CHOLHDL, LDLDIRECT in the last 72 hours. Thyroid function studies No results for input(s): TSH, T4TOTAL, T3FREE, THYROIDAB in the last 72 hours.  Invalid input(s): FREET3 Anemia work up No results for input(s): VITAMINB12, FOLATE, FERRITIN, TIBC, IRON, RETICCTPCT in the last 72 hours. Microbiology Recent Results (from the past 240 hour(s))  Resp Panel by RT-PCR (Flu A&B, Covid) Nasopharyngeal Swab  Status: None   Collection Time: 01/16/22  6:52 PM   Specimen: Nasopharyngeal Swab; Nasopharyngeal(NP) swabs in vial transport medium  Result Value Ref Range Status   SARS Coronavirus 2 by RT PCR NEGATIVE NEGATIVE Final    Comment: (NOTE) SARS-CoV-2 target nucleic acids are NOT DETECTED.  The SARS-CoV-2 RNA is generally detectable in upper respiratory specimens during the acute phase of infection. The lowest concentration of SARS-CoV-2 viral copies this assay can detect is 138 copies/mL. A negative result does not preclude SARS-Cov-2 infection and should not be used as the sole basis for treatment or other patient management decisions. A negative result may occur with  improper specimen collection/handling, submission of specimen other than nasopharyngeal swab, presence of viral mutation(s) within  the areas targeted by this assay, and inadequate number of viral copies(<138 copies/mL). A negative result must be combined with clinical observations, patient history, and epidemiological information. The expected result is Negative.  Fact Sheet for Patients:  BloggerCourse.com  Fact Sheet for Healthcare Providers:  SeriousBroker.it  This test is no t yet approved or cleared by the [place] FDA and  has been authorized for detection and/or diagnosis of SARS-CoV-2 by FDA under an Emergency Use Authorization (EUA). This EUA will remain  in effect (meaning this test can be used) for the duration of the COVID-19 declaration under Section 564(b)(1) of the Act, 21 U.S.C.section 360bbb-3(b)(1), unless the authorization is terminated  or revoked sooner.       Influenza A by PCR NEGATIVE NEGATIVE Final   Influenza B by PCR NEGATIVE NEGATIVE Final    Comment: (NOTE) The Xpert Xpress SARS-CoV-2/FLU/RSV plus assay is intended as an aid in the diagnosis of influenza from Nasopharyngeal swab specimens and should not be used as a sole basis for treatment. Nasal washings and aspirates are unacceptable for Xpert Xpress SARS-CoV-2/FLU/RSV testing.  Fact Sheet for Patients: BloggerCourse.com  Fact Sheet for Healthcare Providers: SeriousBroker.it  This test is not yet approved or cleared by the [place] FDA and has been authorized for detection and/or diagnosis of SARS-CoV-2 by FDA under an Emergency Use Authorization (EUA). This EUA will remain in effect (meaning this test can be used) for the duration of the COVID-19 declaration under Section 564(b)(1) of the Act, 21 U.S.C. section 360bbb-3(b)(1), unless the authorization is terminated or revoked.  Performed at [facility], [address], [place], [place] [address]      Discharge Instructions:   Discharge  Instructions     Diet - low sodium heart healthy   Complete by: As directed    Discharge instructions   Complete by: As directed    Avoid NSAIDs including but not limited to indomethacin, ibuprofen, diclofenac because of increased risk of bleeding when taking anticoagulants like Eliquis. Obtain Eliquis refills from your PCP.   Increase activity slowly   Complete by: As directed       Allergies as of 01/17/2022       Reactions   Lamotrigine Swelling   Oxcarbazepine Swelling, Other (See Comments)   Per pt, "leg swelling" Per pt, "leg swelling" Ankle swelling   Viibryd [vilazodone Hcl] Swelling   Per pt "leg swelling"        Medication List     STOP taking these medications    ibuprofen 200 MG tablet Commonly known as: ADVIL   indomethacin 50 MG capsule Commonly known as: INDOCIN       TAKE these medications    acetaminophen 325 MG tablet Commonly known as: TYLENOL  Take 2 tablets (650 mg total) by mouth every 6 (six) hours as needed for mild pain.   Albuterol Sulfate 108 (90 Base) MCG/ACT Aepb Commonly known as: PROAIR RESPICLICK Inhale 2 puffs into the lungs every 6 (six) hours as needed (shortness of breath / wheezing).   apixaban 5 MG Tabs tablet Commonly known as: ELIQUIS Take 2 tablets (10 mg total) by mouth 2 (two) times daily for 7 days.   apixaban 5 MG Tabs tablet Commonly known as: ELIQUIS Take 1 tablet (5 mg total) by mouth 2 (two) times daily for 28 days. Start taking on: January 24, 2022   folic acid 1 MG tablet Commonly known as: FOLVITE Take 1 mg by mouth daily.   gabapentin 300 MG capsule Commonly known as: NEURONTIN Take 300 mg by mouth 2 (two) times daily.   hydroxychloroquine 200 MG tablet Commonly known as: PLAQUENIL Take 200 mg by mouth 2 (two) times daily.   methotrexate 2.5 MG tablet Commonly known as: RHEUMATREX Take 12.5 mg by mouth every Wednesday.   ondansetron 4 MG disintegrating tablet Commonly known as:  ZOFRAN-ODT Take 4 mg by mouth every 6 (six) hours as needed for nausea or vomiting.   propranolol 20 MG tablet Commonly known as: INDERAL Take 20 mg by mouth 2 (two) times daily as needed (anxiety).           If you experience worsening of your admission symptoms, develop shortness of breath, life threatening emergency, suicidal or homicidal thoughts you must seek medical attention immediately by calling 911 or calling your MD immediately  if symptoms less severe.   You must read complete instructions/literature along with all the possible adverse reactions/side effects for all the medicines you take and that have been prescribed to you. Take any new medicines after you have completely understood and accept all the possible adverse reactions/side effects.    Please note   You were cared for by a hospitalist during your hospital stay. If you have any questions about your discharge medications or the care you received while you were in the hospital after you are discharged, you can call the unit and asked to speak with the hospitalist on call if the hospitalist that took care of you is not available. Once you are discharged, your primary care physician will handle any further medical issues. Please note that NO REFILLS for any discharge medications will be authorized once you are discharged, as it is imperative that you return to your primary care physician (or establish a relationship with a primary care physician if you do not have one) for your aftercare needs so that they can reassess your need for medications and monitor your lab values.       Time coordinating discharge: Greater than 30 minutes  Signed:  Abigail Marsiglia  [facility] 01/17/2022, 1:38 PM   Pager on [email or web address]. If 7PM-7AM, please contact night-coverage at [email or web address]

## 2022-01-17 NOTE — Consult Note (Signed)
ANTICOAGULATION CONSULT NOTE -  Consult  Pharmacy Consult for Heparin gtt Indication: LLE DVT and acute pulmonary embolus  Allergies  Allergen Reactions   Lamotrigine Swelling   Oxcarbazepine Swelling and Other (See Comments)    Per pt, "leg swelling" Per pt, "leg swelling" Ankle swelling    Viibryd [Vilazodone Hcl] Swelling    Per pt "leg swelling"    Patient Measurements: Height: 5\' 1"  (154.9 cm) Weight: (!) 140.6 kg (309 lb 15.5 oz) IBW/kg (Calculated) : 47.8 Heparin Dosing Weight: 83.5kg  Vital Signs: Temp: 98.4 F (36.9 C) (02/04 0004) Temp Source: Oral (02/04 0004) BP: 115/70 (02/04 0004) Pulse Rate: 69 (02/04 0004)  Labs: Recent Labs    01/16/22 1532 01/16/22 1917 01/16/22 2158 01/17/22 0209  HGB 14.2  --   --  13.7  HCT 44.5  --   --  43.5  PLT 214  --   --  205  APTT  --   --  63*  --   LABPROT  --   --  13.5  --   INR  --   --  1.0  --   HEPARINUNFRC  --   --   --  0.21*  CREATININE 0.74  --   --  0.72  TROPONINIHS  --  5  --   --      Estimated Creatinine Clearance: 130.3 mL/min (by C-G formula based on SCr of 0.72 mg/dL).   Medications: Heparin Dosing Weight: 83.5kg PTA: No AC/APT PTA & no AC/APT pertinent drug allergies. Inpatient: +heparin gtt  Assessment:  36 y.o. female, with bipolar 2, anxiety, GERD, presents to the emergency department for evaluation of leg pain.  Patient states that her left lower extremity began hurting approximately 2 days ago. Undergoing hypercoagulability work-up (Pending: cardiolipin, prothrombin gene mut, FVL, homocysteine, B2g, Lupus AC, Protein C/S, & ATIII). Pharmacy consulted for mgmt of heparin gtt.  Date Time aPTT/HL Rate/Comment       Baseline Labs: aPTT - ordered INR - ordered Hgb - 14.2 Plts - 214 Troponin - pending  Goal of Therapy:  Heparin level 0.3-0.7 units/ml Monitor platelets by anticoagulation protocol: Yes   Plan:  2/4:  HL @ 0209 = 0.21, SUBtherapeutic X 1 Will order heparin 1250  units IV X 1 and increase drip rate to 1600 units/hr. Will recheck HL 6 hrs after rate change.   Chritopher Coster D Clinical Pharmacist 01/17/2022 3:06 AM

## 2022-01-17 NOTE — Progress Notes (Signed)
Dr Mal Misty made aware that pt states that she doesn't have a ride or anyone that can come to get her, she wants to drive even though MD advises against it, she said that she understands that MD doesn't want her to drive but she feels safe and comfortable driving, pt being discharged home now

## 2022-01-19 ENCOUNTER — Other Ambulatory Visit: Payer: Self-pay

## 2022-01-19 ENCOUNTER — Emergency Department: Payer: BC Managed Care – PPO

## 2022-01-19 ENCOUNTER — Emergency Department
Admission: EM | Admit: 2022-01-19 | Discharge: 2022-01-19 | Disposition: A | Discharge status: Home / Self Care | Payer: BC Managed Care – PPO | Attending: Emergency Medicine | Admitting: Emergency Medicine

## 2022-01-19 DIAGNOSIS — I2699 Other pulmonary embolism without acute cor pulmonale: Secondary | ICD-10-CM | POA: Insufficient documentation

## 2022-01-19 DIAGNOSIS — I82402 Acute embolism and thrombosis of unspecified deep veins of left lower extremity: Secondary | ICD-10-CM | POA: Insufficient documentation

## 2022-01-19 DIAGNOSIS — Z7901 Long term (current) use of anticoagulants: Secondary | ICD-10-CM | POA: Diagnosis not present

## 2022-01-19 DIAGNOSIS — Z79899 Other long term (current) drug therapy: Secondary | ICD-10-CM | POA: Diagnosis not present

## 2022-01-19 DIAGNOSIS — R079 Chest pain, unspecified: Secondary | ICD-10-CM

## 2022-01-19 DIAGNOSIS — M79605 Pain in left leg: Secondary | ICD-10-CM | POA: Diagnosis present

## 2022-01-19 LAB — PROTEIN S ACTIVITY: Protein S Activity: 86 % (ref 63–140)

## 2022-01-19 LAB — TROPONIN I (HIGH SENSITIVITY): Troponin I (High Sensitivity): 5 ng/L (ref <18)

## 2022-01-19 LAB — CBC
HCT: 45.5 % (ref 36.0–46.0)
Hemoglobin: 14.8 g/dL (ref 12.0–15.0)
MCH: 28.4 pg (ref 26.0–34.0)
MCHC: 32.5 g/dL (ref 30.0–36.0)
MCV: 87.2 fL (ref 80.0–100.0)
Platelets: 250 10*3/uL (ref 150–400)
RBC: 5.22 MIL/uL — ABNORMAL HIGH (ref 3.87–5.11)
RDW: 13.8 % (ref 11.5–15.5)
WBC: 10.6 10*3/uL — ABNORMAL HIGH (ref 4.0–10.5)
nRBC: 0 % (ref 0.0–0.2)

## 2022-01-19 LAB — HOMOCYSTEINE: Homocysteine: 7.8 umol/L (ref 0.0–14.5)

## 2022-01-19 LAB — BASIC METABOLIC PANEL
Anion gap: 8 (ref 5–15)
BUN: 16 mg/dL (ref 6–20)
CO2: 24 mmol/L (ref 22–32)
Calcium: 8.9 mg/dL (ref 8.9–10.3)
Chloride: 105 mmol/L (ref 98–111)
Creatinine, Ser: 0.75 mg/dL (ref 0.44–1.00)
GFR, Estimated: >60 mL/min (ref >60)
Glucose, Bld: 111 mg/dL — ABNORMAL HIGH (ref 70–99)
Potassium: 3.7 mmol/L (ref 3.5–5.1)
Sodium: 137 mmol/L (ref 135–145)

## 2022-01-19 LAB — PROTEIN C ACTIVITY: Protein C Activity: 178 % (ref 73–180)

## 2022-01-19 LAB — PROTEIN C, TOTAL: Protein C, Total: 122 % (ref 60–150)

## 2022-01-19 LAB — BETA-2-GLYCOPROTEIN I ABS, IGG/M/A
Beta-2 Glyco I IgG: <9 GPI IgG units (ref 0–20)
Beta-2-Glycoprotein I IgA: <9 GPI IgA units (ref 0–25)
Beta-2-Glycoprotein I IgM: <9 GPI IgM units (ref 0–32)

## 2022-01-19 LAB — PROTEIN S, TOTAL: Protein S Ag, Total: 95 % (ref 60–150)

## 2022-01-19 MED ORDER — HYDROCODONE-ACETAMINOPHEN 5-325 MG PO TABS: 1.0000 | ORAL_TABLET | ORAL | 0 refills | Status: AC | PRN

## 2022-01-19 MED ORDER — IOHEXOL 350 MG/ML SOLN
75.0000 mL | Freq: Once | INTRAVENOUS | Status: AC | PRN
  Administered 2022-01-19: 75 mL via INTRAVENOUS
  Filled 2022-01-19: qty 75

## 2022-01-19 NOTE — ED Provider Notes (Signed)
[facility] Provider Note    Event Date/Time   First MD Initiated Contact with Patient 01/19/22 1425     (approximate)  History   Chief Complaint: Leg Pain and Chest Pain  HPI  Gina Farmer is a 36 y.o. female with a past medical history of anxiety, bipolar, morbid obesity, recent diagnosis of DVT and PE presents to the emergency department for worsening pain.  According to the patient she has worsening pain in the chest as well as the left leg since being discharged 2 days ago after an admission for DVT and PE.  Patient was concerned she could have increased blood clots or new blood clots.  Patient was discharged on Eliquis and states she has been taking it as prescribed.  Physical Exam   Triage Vital Signs: ED Triage Vitals  Enc Vitals Group     BP 01/19/22 1309 (!) 159/85     Pulse Rate 01/19/22 1309 94     Resp 01/19/22 1309 18     Temp 01/19/22 1309 98.4 F (36.9 C)     Temp Source 01/19/22 1309 Oral     SpO2 01/19/22 1309 100 %     Weight 01/19/22 1404 (!) 308 lb 10.3 oz (140 kg)     Height 01/19/22 1404 5\' 1"  (1.549 m)     Head Circumference --      Peak Flow --      Pain Score 01/19/22 1257 10     Pain Loc --      Pain Edu? --      Excl. in GC? --     Most recent vital signs: Vitals:   01/19/22 1309  BP: (!) 159/85  Pulse: 94  Resp: 18  Temp: 98.4 F (36.9 C)  SpO2: 100%    General: Awake, no distress.  Morbidly obese. CV:  Good peripheral perfusion.  Regular rate and rhythm  Resp:  Normal effort.  Equal breath sounds bilaterally.  Abd:  No distention.  Soft, nontender.  No rebound or guarding. Other:  No obvious increased left leg edema versus right leg.   ED Results / Procedures / Treatments   EKG  EKG viewed and interpreted by myself shows a normal sinus rhythm at 92 bpm with a narrow QRS, normal axis, normal intervals, no concerning ST changes.  RADIOLOGY  I have personally evaluated the x-ray images, no acute  finding on my evaluation of the chest x-ray. Chest x-ray is negative   MEDICATIONS ORDERED IN ED: Medications - No data to display   IMPRESSION / MDM / ASSESSMENT AND PLAN / ED COURSE  I reviewed the triage vital signs and the nursing notes.  Patient presents the emergency department for worsening pain in the chest and left leg since recently being diagnosed with a DVT.  I reviewed the patient's discharge summary from 01/17/2022 the patient was discharged following admission for PE and DVT.  Patient placed on Eliquis.  Patient states she has been compliant with her Eliquis.  Patient states she was not discharged with any pain medication.  Patient states she has had increased pain in the left leg and chest since going home and was concerned that there could be worsening clot or new clot.  Patient's body habitus makes examination difficult however no obvious increased edema in the left leg worse the right leg.  Patient's lab work shows largely normal CBC with a white blood cell count of 10.6.  Normal chemistry with normal renal function.  And reassuringly negative troponin.  EKG is reassuring and chest x-ray is reassuring.  However given the patient's worsening left leg pain and chest pain we will obtain repeat CT imaging of the chest to rule out new PE as well as a repeat ultrasound to rule out clot propagation.  Patient agreeable to plan of care.  If CT and ultrasound show no significant change or findings anticipate likely discharge home on pain medication and PCP follow-up.  I discussed this plan of care with the patient who is agreeable.  Patient care signed out to oncoming provider.  FINAL CLINICAL IMPRESSION(S) / ED DIAGNOSES   Pulmonary embolism Chest pain DVT  Rx / DC Orders   [place]  Note:  This document was prepared using Dragon voice recognition software and may include unintentional dictation errors.   Minna Antis, MD 01/19/22 [phone]

## 2022-01-19 NOTE — ED Triage Notes (Addendum)
Pt come with c/o CP and left leg pain. Pt states she was recently dx with DVT in left leg and PE here at [facility].

## 2022-01-19 NOTE — ED Notes (Signed)
See triage note  presents with some chest pain and left leg pain  states she was dx;'d with DVT last week  also having some SOB

## 2022-01-19 NOTE — ED Provider Notes (Signed)
----------------------------------------- °  3:05 PM on 01/19/2022 -----------------------------------------  Blood pressure (!) 159/85, pulse 94, temperature 98.4 F (36.9 C), temperature source Oral, resp. rate 18, height 5\' 1"  (1.549 m), weight (!) 140 kg, SpO2 100 %.  Assuming care from Dr. .  In short, Gina Farmer is a 36 y.o. female with a chief complaint of Leg Pain and Chest Pain .  Refer to the original H&P for additional details.  The current plan of care is to follow-up CTA chest and lower extremity 31 for recurrent PE/DVT.  ----------------------------------------- 4:51 PM on 01/19/2022 ----------------------------------------- CTA chest redemonstrates known PE with no evidence of worsening.  Lower extremity ultrasound also similar to previous with no evidence of extension of known DVT.  Patient is appropriate for outpatient follow-up, states she has an appointment with her PCP tomorrow and she was also provided with referral to follow-up with vascular surgery.  She was counseled to remain consistent with her anticoagulation and to return to the ED for new or worsening symptoms, patient agrees with plan.    03/19/2022, MD 01/19/22 [phone]

## 2022-01-20 LAB — LUPUS ANTICOAGULANT PANEL
DRVVT: 40 s (ref 0.0–47.0)
PTT Lupus Anticoagulant: 35.8 s (ref 0.0–51.9)

## 2022-01-21 LAB — CARDIOLIPIN ANTIBODIES, IGG, IGM, IGA
Anticardiolipin IgA: <9 APL U/mL (ref 0–11)
Anticardiolipin IgG: <9 GPL U/mL (ref 0–14)
Anticardiolipin IgM: 9 MPL U/mL (ref 0–12)

## 2022-01-22 LAB — FACTOR 5 LEIDEN

## 2022-01-22 LAB — PROTHROMBIN GENE MUTATION

## 2022-01-26 ENCOUNTER — Emergency Department: Payer: BC Managed Care – PPO

## 2022-01-26 ENCOUNTER — Other Ambulatory Visit: Payer: Self-pay

## 2022-01-26 ENCOUNTER — Emergency Department
Admission: EM | Admit: 2022-01-26 | Discharge: 2022-01-26 | Disposition: A | Discharge status: Home / Self Care | Payer: BC Managed Care – PPO | Attending: Emergency Medicine | Admitting: Emergency Medicine

## 2022-01-26 DIAGNOSIS — I2699 Other pulmonary embolism without acute cor pulmonale: Secondary | ICD-10-CM | POA: Diagnosis not present

## 2022-01-26 DIAGNOSIS — R1011 Right upper quadrant pain: Secondary | ICD-10-CM | POA: Insufficient documentation

## 2022-01-26 DIAGNOSIS — Z7901 Long term (current) use of anticoagulants: Secondary | ICD-10-CM | POA: Insufficient documentation

## 2022-01-26 DIAGNOSIS — R079 Chest pain, unspecified: Secondary | ICD-10-CM | POA: Diagnosis not present

## 2022-01-26 LAB — CBC
HCT: 42.8 % (ref 36.0–46.0)
Hemoglobin: 13.2 g/dL (ref 12.0–15.0)
MCH: 28 pg (ref 26.0–34.0)
MCHC: 30.8 g/dL (ref 30.0–36.0)
MCV: 90.9 fL (ref 80.0–100.0)
Platelets: 375 10*3/uL (ref 150–400)
RBC: 4.71 MIL/uL (ref 3.87–5.11)
RDW: 13.3 % (ref 11.5–15.5)
WBC: 10.6 10*3/uL — ABNORMAL HIGH (ref 4.0–10.5)
nRBC: 0 % (ref 0.0–0.2)

## 2022-01-26 LAB — BASIC METABOLIC PANEL
Anion gap: 7 (ref 5–15)
BUN: 13 mg/dL (ref 6–20)
CO2: 28 mmol/L (ref 22–32)
Calcium: 9 mg/dL (ref 8.9–10.3)
Chloride: 105 mmol/L (ref 98–111)
Creatinine, Ser: 0.63 mg/dL (ref 0.44–1.00)
GFR, Estimated: >60 mL/min (ref >60)
Glucose, Bld: 105 mg/dL — ABNORMAL HIGH (ref 70–99)
Potassium: 4.2 mmol/L (ref 3.5–5.1)
Sodium: 140 mmol/L (ref 135–145)

## 2022-01-26 LAB — TROPONIN I (HIGH SENSITIVITY)
Troponin I (High Sensitivity): 4 ng/L (ref <18)
Troponin I (High Sensitivity): 4 ng/L (ref <18)

## 2022-01-26 LAB — HEPATIC FUNCTION PANEL
ALT: 27 U/L (ref 0–44)
AST: 20 U/L (ref 15–41)
Albumin: 3.2 g/dL — ABNORMAL LOW (ref 3.5–5.0)
Alkaline Phosphatase: 57 U/L (ref 38–126)
Bilirubin, Direct: <0.1 mg/dL (ref 0.0–0.2)
Total Bilirubin: 0.4 mg/dL (ref 0.3–1.2)
Total Protein: 6.7 g/dL (ref 6.5–8.1)

## 2022-01-26 LAB — LIPASE, BLOOD: Lipase: 28 U/L (ref 11–51)

## 2022-01-26 MED ORDER — OXYCODONE HCL 5 MG PO TABS
5.0000 mg | ORAL_TABLET | Freq: Four times a day (QID) | ORAL | 0 refills | Status: AC | PRN
Start: 2022-01-26 — End: 2022-01-29

## 2022-01-26 MED ORDER — OXYCODONE HCL 5 MG PO TABS
5.0000 mg | ORAL_TABLET | Freq: Once | ORAL | Status: AC
Start: 2022-01-26 — End: 2022-01-26
  Administered 2022-01-26: 5 mg via ORAL
  Filled 2022-01-26: qty 1

## 2022-01-26 MED ORDER — IOHEXOL 350 MG/ML SOLN
75.0000 mL | Freq: Once | INTRAVENOUS | Status: AC | PRN
  Administered 2022-01-26: 75 mL via INTRAVENOUS

## 2022-01-26 NOTE — ED Triage Notes (Signed)
Pt comes with c/o right sided CP. Pt states she thinks it might be her PE. Pt was seen here in ED for DVT and PE few days back.

## 2022-01-26 NOTE — Discharge Instructions (Addendum)
You can take Tylenol 1 g every 8 hours and use the oxycodone for breakthrough pain.  She can follow-up with a hematologist tomorrow and return to the ER if develop worsening symptoms.  Talk to your primary care doctor about your anxiety in regards to this    1. Small 5 mm nonshadowing polyp versus adherent sludge ball in the posterior gallbladder. No follow-up required. This recommendation follows ACR consensus guidelines: White Paper of the ACR Incidental Findings Committee II on Gallbladder and Biliary Findings. J Am Coll Radiol 2013:;10:953-956. 2. Otherwise normal right upper quadrant abdominal sonogram. No cholelithiasis. No biliary ductal dilatation.

## 2022-01-26 NOTE — ED Provider Notes (Signed)
[facility] Provider Note    Event Date/Time   First MD Initiated Contact with Patient 01/26/22 [phone]     (approximate)   History   Chest Pain   HPI  Gina Farmer is a 36 y.o. female morbid obesity, bipolar disorder, depression, GERD, PTSD, anxiety, back injury who was recently found to have acute DVT as well as PE and started on IV heparin back in February 3 and transition to Eliquis.  Patient reports that she has been compliant with her Eliquis.  She states that she comes in today for severe right upper quadrant right lower chest pain.  She states that it was a sharp severe pain that started at 7 AM.  Reports that she caught her breath and she feels more short of breath from it.  Patient is tearful stating that she has had some family members who have died in their 30s from a heart attack and blood clot and she is just very worried.  She reports worsening pain in her right leg although the pain in the left leg seems to be doing better.   I reviewed the discharge summary from 2/3 where patient was on the heparin.  She returned to the ER on 2/6 for chest pain.  At that time she had repeat ultrasound and repeat CT that were stable in size.  She then went to PCP on 2/7 where she was told that she want to follow-up with [facility] vascular surgery for thrombectomy and was given Norco for pain.      Physical Exam   Triage Vital Signs: ED Triage Vitals [01/26/22 0851]  Enc Vitals Group     BP      Pulse      Resp      Temp      Temp src      SpO2      Weight      Height      Head Circumference      Peak Flow      Pain Score 4     Pain Loc      Pain Edu?      Excl. in GC?     Most recent vital signs: Vitals:   01/26/22 0856 01/26/22 1000  BP: (!) 142/116 133/85  Pulse: 71 62  Resp: 20 16  Temp: 98 F (36.7 C)   SpO2: 98% 99%     General: Awake, no distress.  Patient is tearful CV:  Good peripheral perfusion.  Resp:  Normal effort.   Abd:  No distention.  Some right upper quadrant tenderness Other:  Little bit of swelling noted in both leg.  2+ distal pulses   ED Results / Procedures / Treatments   Labs (all labs ordered are listed, but only abnormal results are displayed) Labs Reviewed  BASIC METABOLIC PANEL - Abnormal; Notable for the following components:      Result Value   Glucose, Bld 105 (*)    All other components within normal limits  CBC - Abnormal; Notable for the following components:   WBC 10.6 (*)    All other components within normal limits  HEPATIC FUNCTION PANEL - Abnormal; Notable for the following components:   Albumin 3.2 (*)    All other components within normal limits  LIPASE, BLOOD  POC URINE PREG, ED  TROPONIN I (HIGH SENSITIVITY)  TROPONIN I (HIGH SENSITIVITY)     EKG  My interpretation of EKG:  Normal sinus rate of  65 without any ST elevation or T wave inversions, normal intervals  RADIOLOGY I have reviewed the xray personally and agree with radiology read that was normal  Right upper quadrant ultrasound shows 5 mm polyp versus sludge ball.  However reviewed prior ultrasounds and it also showed polyp   Right leg without evidence of DVT  PROCEDURES:  Critical Care performed: No  .1-3 Lead EKG Interpretation Performed by: Concha Se, MD Authorized by: Concha Se, MD     Interpretation: normal     ECG rate:  60   ECG rate assessment: normal     Rhythm: sinus rhythm     Ectopy: none     Conduction: normal     MEDICATIONS ORDERED IN ED: Medications  oxyCODONE (Oxy IR/ROXICODONE) immediate release tablet 5 mg (5 mg Oral Given 01/26/22 1000)  iohexol (OMNIPAQUE) 350 MG/ML injection 75 mL (75 mLs Intravenous Contrast Given 01/26/22 1229)     IMPRESSION / MDM / ASSESSMENT AND PLAN / ED COURSE  I reviewed the triage vital signs and the nursing notes.   Patient comes in with continued right-sided chest, upper abdominal pain.  Patient seems very anxious and  tearful.  We discussed the pros and cons of repeat CT imaging but she would like to proceed with repeating to make sure no worsening clot burden.  I also added on ultrasound to make sure no evidence of DVT.  Also discussed getting ultrasound gallbladder to start sure that there is nothing else causing her pain  Lipase is normal CBC shows slightly elevated white count similar to prior Troponin was negative LFTs are normal  Ultrasounds were negative except for polyp in gallbladder, x-ray was negative  CT imaging was reassuring.  Showing less clot burden.  I had discussed the case with Dr. Wyn Quaker from vascular and he came down and evaluated the patient does not feel like she is a thrombectomy candidate due to location of clot.  Reevaluated patient she is feeling much better.  She reports that she was prescribed pain medicine but her pharmacy has not been able to fill it due to being out of it.  We will try a short course of oxycodone in the meantime.  Patient feels comfortable with going home and she will follow-up with her primary care doctor as well as hematology tomorrow.  Shortness of the importance of continuing her Eliquis that it may be important to talk to her primary care doctor about her anxiety potentially need to be started on something for this.  She expressed understanding felt comfortable with this plan  Given onset of timing will get repeat troponin and if negative will discharge  I discussed the provisional nature of ED diagnosis, the treatment so far, the ongoing plan of care, follow up appointments and return precautions with the patient and any family or support people present. They expressed understanding and agreed with the plan, discharged home.        The patient is on the cardiac monitor to evaluate for evidence of arrhythmia and/or significant heart rate changes.   FINAL CLINICAL IMPRESSION(S) / ED DIAGNOSES   Final diagnoses:  RUQ abdominal pain  Other acute  pulmonary embolism without acute cor pulmonale (HCC)  Chest pain, unspecified type     Rx / DC Orders   ED Discharge Orders          Ordered    oxyCODONE (ROXICODONE) 5 MG immediate release tablet  Every 6 hours PRN  01/26/22 1424             Note:  This document was prepared using Dragon voice recognition software and may include unintentional dictation errors.   Concha Se, MD 01/26/22 1425

## 2022-01-27 ENCOUNTER — Inpatient Hospital Stay: Payer: BC Managed Care – PPO

## 2022-01-27 ENCOUNTER — Encounter: Payer: Self-pay | Admitting: Oncology

## 2022-01-27 ENCOUNTER — Inpatient Hospital Stay: Payer: BC Managed Care – PPO | Attending: Oncology | Admitting: Oncology

## 2022-01-27 VITALS — BP 138/96 | HR 61 | Temp 97.0°F | Wt 308.0 lb

## 2022-01-27 DIAGNOSIS — Z7901 Long term (current) use of anticoagulants: Secondary | ICD-10-CM | POA: Diagnosis not present

## 2022-01-27 DIAGNOSIS — Z79899 Other long term (current) drug therapy: Secondary | ICD-10-CM | POA: Diagnosis not present

## 2022-01-27 DIAGNOSIS — Z87891 Personal history of nicotine dependence: Secondary | ICD-10-CM | POA: Diagnosis not present

## 2022-01-27 DIAGNOSIS — I82412 Acute embolism and thrombosis of left femoral vein: Secondary | ICD-10-CM | POA: Diagnosis present

## 2022-01-27 DIAGNOSIS — I2699 Other pulmonary embolism without acute cor pulmonale: Secondary | ICD-10-CM | POA: Insufficient documentation

## 2022-01-27 MED ORDER — APIXABAN 5 MG PO TABS: 5.0000 mg | ORAL_TABLET | Freq: Two times a day (BID) | ORAL | 2 refills | Status: DC

## 2022-01-27 NOTE — Progress Notes (Signed)
. Hematology/Oncology Consult note [phone] [phone]         Patient Care Team: Dionicia Abler, PA-C as PCP - General (Family Medicine)  REFERRING PROVIDER: Wayland Denis, PA-C  CHIEF COMPLAINTS/REASON FOR VISIT:  Evaluation of acute pulmonary embolism and lower extremity DVT.  HISTORY OF PRESENTING ILLNESS:   Gina Farmer is a  36 y.o.  female with PMH listed below was seen in consultation at the request of  Wayland Denis, PA-C  for evaluation of acute pulmonary embolism and lower extremity DVT.  01/16/2022, patient presented to ER for evaluation of left lower extremity pain.  Denies any immobilization factors. Ultrasound left lower extremity Doppler showed acute occlusive DVT involving the caudal course of left femoral vein, left popliteal vein and left posterior tibial vein.  01/16/2022, CT angio gram chest PE protocol showed acute pulmonary embolus, within the segmental right lower lobe and subsegmental right upper and lower lobe branches.  Small volume clot burden.  No right heart strain. Patient was admitted and started on IV heparin.  Transition to Eliquis at discharge on 01/17/2022..  During her hospitalization, patient had hypercoagulable work-up including negative antiphospholipid syndrome panel, negative prothrombin gene mutation, negative factor V Leiden mutation, normal protein C and S activity and antigen level.  Normal Antithrombin III level.  Patient presented to emergency room on 01/19/2022 due to chest pain and had repeat CT chest angiogram which showed no evidence of worsening.  01/19/2022 left lower extremity ultrasound showed occlusive thrombus of the distal left femoral vein and popliteal vein.  01/26/2022, patient again presented emergency room for evaluation of right lower chest pain and shortness of breath. 01/26/2022 repeat CT chest angiogram showed decreased clot burden.  Right lower extremity ultrasound was also negative for DVT. She  was also seen by vascular surgeon Dr. Lucky Cowboy who felt that patient is not a thrombectomy candidate due to location of the clot.  Today patient presents to hematology clinic for evaluation.  She denies chest pain today.  No worsening of shortness of breath.  She continues to have left lower extremity swelling/pain.  She intermittently elevates her leg with some symptom improvement..  She has baseline right lower extremity swelling as well.  Patient works at [employer] and spends most of the time during her work.  She feels that she is not able to continue her work due to the symptoms. Patient has Family history of ovarian cancer.  She has endometriosis and has had salpingo-oophorectomy bilaterally.  No immobilization factors could be found to attribute to her DVT and PE.   Review of Systems  Constitutional:  Negative for appetite change, chills, fatigue and fever.  HENT:   Negative for hearing loss and voice change.   Eyes:  Negative for eye problems.  Respiratory:  Negative for chest tightness and cough.   Cardiovascular:  Positive for leg swelling. Negative for chest pain.  Gastrointestinal:  Negative for abdominal distention, abdominal pain and blood in stool.  Endocrine: Negative for hot flashes.  Genitourinary:  Negative for difficulty urinating and frequency.   Musculoskeletal:  Negative for arthralgias.  Skin:  Negative for itching and rash.  Neurological:  Negative for extremity weakness.  Hematological:  Negative for adenopathy.  Psychiatric/Behavioral:  Negative for confusion.    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, left    Morbid obesity (HCC)    Post traumatic stress disorder (PTSD)    PTSD (post-traumatic stress  disorder)     SURGICAL HISTORY: Past Surgical History:  Procedure Laterality Date   APPENDECTOMY  2009   INTRAOCULAR LENS IMPLANT, SECONDARY     from eye injury   LAPAROSCOPIC BILATERAL SALPINGO OOPHERECTOMY Bilateral 07/25/2020   Procedure: LAPAROSCOPIC BILATERAL SALPINGO OOPHORECTOMY;  Surgeon: Vena Austria, MD;  Location: [facility] ORS;  Service: Gynecology;  Laterality: Bilateral;   LAPAROSCOPY  2003   LAPAROSCOPY N/A 05/21/2020   Procedure: LAPAROSCOPY DIAGNOSTIC;  Surgeon: Vena Austria, MD;  Location: [facility] ORS;  Service: Gynecology;  Laterality: N/A;   SACROILIAC JOINT INJECTION      SOCIAL HISTORY: Social History   Socioeconomic History   Marital status: Single    Spouse name: Not on file   Number of children: Not on file   Years of education: Not on file   Highest education level: Not on file  Occupational History   Not on file  Tobacco Use   Smoking status: Former    Packs/day: 0.25    Types: Cigarettes    Quit date: 12/19/2020    Years since quitting: 1.1   Smokeless tobacco: Never  Vaping Use   Vaping Use: Some days   Last attempt to quit: 05/14/2018   Substances: Nicotine, THC, Flavoring  Substance and Sexual Activity   Alcohol use: Not Currently    Comment: social   Drug use: Yes    Types: Marijuana    Comment: occ   Sexual activity: Yes    Birth control/protection: Surgical  Other Topics Concern   Not on file  Social History Narrative   Not on file   Social Determinants of Health   Financial Resource Strain: Not on file  Food Insecurity: Not on file  Transportation Needs: Not on file  Physical Activity: Not on file  Stress: Not on file  Social Connections: Not on file  Intimate Partner Violence: Not on file    FAMILY HISTORY: Family History  Problem Relation Age of Onset   Hypertension Father    Heart disease Father    Narcolepsy Father    Obesity Father    Hypertension Brother    Obesity Maternal Aunt    Ovarian cancer Maternal Aunt  40   Obesity Paternal Aunt    Heart disease Maternal Grandfather    Ovarian cancer Paternal Grandmother        77s   Heart disease Paternal Grandfather    Uterine cancer Other     ALLERGIES:  is allergic to lamotrigine, oxcarbazepine, and viibryd [vilazodone hcl].  MEDICATIONS:  Current Outpatient Medications  Medication Sig Dispense Refill   acetaminophen (TYLENOL) 325 MG tablet Take 2 tablets (650 mg total) by mouth every 6 (six) hours as needed for mild pain.     Albuterol Sulfate (PROAIR RESPICLICK) 108 (90 Base) MCG/ACT AEPB Inhale 2 puffs into the lungs every 6 (six) hours as needed (shortness of breath / wheezing).     folic acid (FOLVITE) 1 MG tablet Take 1 mg by mouth daily.     gabapentin (NEURONTIN) 300 MG capsule Take 300 mg by mouth 2 (two) times daily.     HYDROcodone-acetaminophen (NORCO/VICODIN) 5-325 MG tablet Take 1 tablet by mouth every 4 (four) hours as needed for moderate pain.  15 tablet 0   hydroxychloroquine (PLAQUENIL) 200 MG tablet Take 200 mg by mouth 2 (two) times daily.     methotrexate (RHEUMATREX) 2.5 MG tablet Take 12.5 mg by mouth every Wednesday.     ondansetron (ZOFRAN-ODT) 4 MG disintegrating tablet Take 4 mg by mouth every 6 (six) hours as needed for nausea or vomiting.     oxyCODONE (ROXICODONE) 5 MG immediate release tablet Take 1 tablet (5 mg total) by mouth every 6 (six) hours as needed for up to 3 days. 12 tablet 0   propranolol (INDERAL) 20 MG tablet Take 20 mg by mouth 2 (two) times daily as needed (anxiety).     apixaban (ELIQUIS) 5 MG TABS tablet Take 1 tablet (5 mg total) by mouth 2 (two) times daily. 60 tablet 2   No current facility-administered medications for this visit.     PHYSICAL EXAMINATION: ECOG PERFORMANCE STATUS: 1 - Symptomatic but completely ambulatory Vitals:   01/27/22 0943  BP: (!) 138/96  Pulse: 61  Temp: (!) 97 F (36.1 C)   Filed Weights   01/27/22 0943  Weight: (!) 308 lb (139.7 kg)    Physical  Exam Constitutional:      General: She is not in acute distress.    Appearance: She is obese.  HENT:     Head: Normocephalic and atraumatic.  Eyes:     General: No scleral icterus. Cardiovascular:     Rate and Rhythm: Normal rate and regular rhythm.     Heart sounds: Normal heart sounds.  Pulmonary:     Effort: Pulmonary effort is normal. No respiratory distress.     Breath sounds: No wheezing.  Abdominal:     General: Bowel sounds are normal. There is no distension.     Palpations: Abdomen is soft.  Musculoskeletal:        General: Swelling present. No deformity. Normal range of motion.     Cervical back: Normal range of motion and neck supple.     Comments: Bilateral lower extremity swelling, left worse than right  Skin:    General: Skin is warm and dry.     Findings: No erythema or rash.  Neurological:     Mental Status: She is alert and oriented to person, place, and time. Mental status is at baseline.     Cranial Nerves: No cranial nerve deficit.     Coordination: Coordination normal.  Psychiatric:        Mood and Affect: Mood normal.    LABORATORY DATA:  I have reviewed the data as listed Lab Results  Component Value Date   WBC 10.6 (H) 01/26/2022   HGB 13.2 01/26/2022   HCT 42.8 01/26/2022   MCV 90.9 01/26/2022   PLT 375 01/26/2022   Recent Labs    09/19/21 0306 01/16/22 1532 01/17/22 0209 01/19/22 1310 01/26/22 0921  NA 141   < > 142 137 140  K 3.6   < > 3.8 3.7 4.2  CL 105   < > 107 105 105  CO2 27   < > 29 24 28   GLUCOSE 97   < > 136* 111* 105*  BUN 12   < > 21* 16 13  CREATININE 0.83   < > 0.72 0.75 0.63  CALCIUM 9.5   < > 8.7* 8.9 9.0  GFRNONAA >60   < > >60 >60 >60  PROT 7.8  --  5.9*  --  6.7  ALBUMIN 4.2  --  3.0*  --  3.2*  AST 22  --  13*  --  20  ALT 32  --  18  --  27  ALKPHOS 74  --  53  --  57  BILITOT 0.7  --  0.4  --  0.4  BILIDIR  --   --   --   --  <0.1  IBILI  --   --   --   --  NOT CALCULATED   < > = values in this interval  not displayed.   Iron/TIBC/Ferritin/ %Sat No results found for: IRON, TIBC, FERRITIN, IRONPCTSAT    RADIOGRAPHIC STUDIES: I have personally reviewed the radiological images as listed and agreed with the findings in the report. DG Chest 2 View  Result Date: 01/26/2022 CLINICAL DATA:  Chest pain. EXAM: CHEST - 2 VIEW COMPARISON:  January 19, 2022. FINDINGS: No consolidation. No visible pleural effusions or pneumothorax. Cardiomediastinal silhouette is within normal limits. No evidence of acute osseous abnormality. IMPRESSION: No evidence of acute cardiopulmonary disease. Electronically Signed   By: Margaretha Sheffield M.D.   On: 01/26/2022 09:27   DG Chest 2 View  Result Date: 01/19/2022 CLINICAL DATA:  Chest pain, LEFT leg pain, recent DVT LEFT leg and RIGHT pulmonary embolus EXAM: CHEST - 2 VIEW COMPARISON:  06/06/2021 FINDINGS: Normal heart size, mediastinal contours, and pulmonary vascularity. Lungs clear. No pleural effusion or pneumothorax. Bones unremarkable. IMPRESSION: No acute abnormalities. Electronically Signed   By: Lavonia Dana M.D.   On: 01/19/2022 13:29   CT Angio Chest PE W and/or Wo Contrast  Result Date: 01/26/2022 CLINICAL DATA:  Right-sided chest pain. EXAM: CT ANGIOGRAPHY CHEST WITH CONTRAST TECHNIQUE: Multidetector CT imaging of the chest was performed using the standard protocol during bolus administration of intravenous contrast. Multiplanar CT image reconstructions and MIPs were obtained to evaluate the vascular anatomy. RADIATION DOSE REDUCTION: This exam was performed according to the departmental dose-optimization program which includes automated exposure control, adjustment of the mA and/or kV according to patient size and/or use of iterative reconstruction technique. CONTRAST:  39mL OMNIPAQUE IOHEXOL 350 MG/ML SOLN COMPARISON:  01/19/2022.  01/16/2022. FINDINGS: Cardiovascular: Heart size upper normal. No pericardial effusion. No thoracic aortic aneurysm. No substantial  atherosclerosis of the thoracic aorta. Nonocclusive subsegmental thrombus to the right lower lobe is again noted with clot burden decreased comparing to the 01/16/2022 exam. Mediastinum/Nodes: No mediastinal lymphadenopathy. There is no hilar lymphadenopathy. The esophagus has normal imaging features. There is no axillary lymphadenopathy. Lungs/Pleura: No focal airspace consolidation. No suspicious pulmonary nodule or mass. No pleural effusion. Upper Abdomen: Unremarkable. Musculoskeletal: No worrisome lytic or sclerotic osseous abnormality. Review of the MIP images confirms the above findings. IMPRESSION: Nonocclusive thrombus again noted subsegmental pulmonary arteries of the right lower lobe. Comparing back to the study from 01/16/2022, the burden of right lower lobe pulmonary arterial clot has decreased in the interval. No new acute findings on today's study. Electronically Signed   By: Misty Stanley M.D.   On: 01/26/2022 12:51   CT Angio Chest PE W and/or Wo Contrast  Result Date: 01/19/2022 CLINICAL DATA:  Chest pain EXAM: CT ANGIOGRAPHY CHEST WITH CONTRAST TECHNIQUE: Multidetector CT imaging of the chest was performed using the standard protocol during bolus administration of intravenous contrast. Multiplanar CT image reconstructions and MIPs were obtained to evaluate the vascular anatomy. RADIATION DOSE REDUCTION: This exam was performed according to the departmental dose-optimization program which includes automated exposure control, adjustment of the mA and/or kV according to patient size and/or use of iterative reconstruction  technique. CONTRAST:  52mL OMNIPAQUE IOHEXOL 350 MG/ML SOLN COMPARISON:  Chest CT dated January 16, 2022 FINDINGS: Cardiovascular: Pulmonary embolus seen in right lower lobe pulmonary artery segmental and subsegmental branches, unchanged when compared with prior exam. Normal heart size. No pericardial effusion. Normal caliber thoracic aorta, evaluation of the aortic root is  limited due to cardiac motion. No coronary artery calcifications. Mediastinum/Nodes: Esophagus and thyroid are unremarkable. No pathologically enlarged lymph nodes seen in the chest. Lungs/Pleura: Central airways are patent. No consolidation, pleural effusion or pneumothorax. Upper Abdomen: No acute abnormality. Musculoskeletal: No chest wall abnormality. No acute or significant osseous findings. Review of the MIP images confirms the above findings. IMPRESSION: Right lower lobe pulmonary embolus is unchanged compared to prior exam dated January 16, 2022. No evidence of new pulmonary embolus. Electronically Signed   By: Yetta Glassman M.D.   On: 01/19/2022 15:49   CT Angio Chest PE W/Cm &/Or Wo Cm  Result Date: 01/16/2022 CLINICAL DATA:  Pulmonary embolism (PE) suspected, high prob Left leg DVT. EXAM: CT ANGIOGRAPHY CHEST WITH CONTRAST TECHNIQUE: Multidetector CT imaging of the chest was performed using the standard protocol during bolus administration of intravenous contrast. Multiplanar CT image reconstructions and MIPs were obtained to evaluate the vascular anatomy. RADIATION DOSE REDUCTION: This exam was performed according to the departmental dose-optimization program which includes automated exposure control, adjustment of the mA and/or kV according to patient size and/or use of iterative reconstruction technique. CONTRAST:  29mL OMNIPAQUE IOHEXOL 350 MG/ML SOLN COMPARISON:  Chest radiograph 06/06/2021, no recent chest imaging available. FINDINGS: Cardiovascular: Examination is positive for acute pulmonary embolus with filling defects in the segmental branches of the right lower lobe, multiple right lower lobe subsegmental branches. There is also subsegmental pulmonary emboli in the anterior right upper lobe pulmonary arteries. Thromboembolic burden is small. No left-sided pulmonary emboli. No right heart strain, RV to LV ratio 0.84. The heart is normal in size. The thoracic aorta is normal in caliber. No  pericardial effusion. Mediastinum/Nodes: Enlarged mediastinal, hilar, or axillary lymph nodes. No esophageal wall thickening. No visualized thyroid nodule. Lungs/Pleura: No pulmonary infarct or focal airspace disease. The lungs are clear. No pleural effusion. Trachea and central bronchi are patent. Upper Abdomen: No acute upper abdominal findings. Musculoskeletal: There are no acute or suspicious osseous abnormalities. Review of the MIP images confirms the above findings. IMPRESSION: 1. Examination is positive for acute pulmonary embolus. Filling defects within the segmental right lower lobe and submental right upper and lower lobe branches. Small volume clot burden. No right heart strain. 2. No pulmonary infarct or focal airspace disease. Critical Value/emergent results were called by telephone at the time of interpretation on 01/16/2022 at 6:35 pm to provider [facility] , who verbally acknowledged these results. Electronically Signed   By: Keith Rake M.D.   On: 01/16/2022 18:36   US Venous Img Lower Unilateral Left  Result Date: 01/19/2022 CLINICAL DATA:  increased pain, recent DVT dx EXAM: LEFT LOWER EXTREMITY VENOUS DOPPLER ULTRASOUND TECHNIQUE: Gray-scale sonography with compression, as well as color and duplex ultrasound, were performed to evaluate the deep venous system(s) from the level of the common femoral vein through the popliteal and proximal calf veins. COMPARISON:  Ultrasound 01/16/2022 FINDINGS: VENOUS Normal compressibility of the common femoral, superficial femoral, and visualized portions of profunda femoral vein and great saphenous vein. There is occlusive thrombus in the distal left femoral vein and popliteal vein. The posterior tibial vein appears patent. The left peroneal vein appears compressible. Limited views  of the contralateral common femoral vein are unremarkable. OTHER There is a Baker cyst measuring 2.2 x 0.8 x 2.5 cm. Limitations: none IMPRESSION: Occlusive thrombus in the  distal left femoral vein and popliteal vein as seen on recent DVT ultrasound. The calf veins appear patent. Electronically Signed   By: Maurine Simmering M.D.   On: 01/19/2022 16:21   US Venous Img Lower Unilateral Left  Result Date: 01/16/2022 CLINICAL DATA:  Pain and swelling EXAM: Left LOWER EXTREMITY VENOUS DOPPLER ULTRASOUND TECHNIQUE: Gray-scale sonography with compression, as well as color and duplex ultrasound, were performed to evaluate the deep venous system(s) from the level of the common femoral vein through the popliteal and proximal calf veins. COMPARISON:  None. FINDINGS: VENOUS There is evidence of acute occlusive DVT involving the caudal course of left femoral vein, left popliteal vein and posterior tibial vein. Limited views of the contralateral common femoral vein are unremarkable. OTHER None. Limitations: none IMPRESSION: There is acute occlusive DVT involving the caudal course of left femoral vein, left popliteal vein and left posterior tibial vein. Electronically Signed   By: Elmer Picker M.D.   On: 01/16/2022 17:31   US Venous Img Lower Unilateral Right  Result Date: 01/26/2022 CLINICAL DATA:  36 year old female with leg pain EXAM: RIGHT LOWER EXTREMITY VENOUS DOPPLER ULTRASOUND TECHNIQUE: Gray-scale sonography with graded compression, as well as color Doppler and duplex ultrasound were performed to evaluate the lower extremity deep venous systems from the level of the common femoral vein and including the common femoral, femoral, profunda femoral, popliteal and calf veins including the posterior tibial, peroneal and gastrocnemius veins when visible. The superficial great saphenous vein was also interrogated. Spectral Doppler was utilized to evaluate flow at rest and with distal augmentation maneuvers in the common femoral, femoral and popliteal veins. COMPARISON:  None. FINDINGS: Contralateral Common Femoral Vein: Respiratory phasicity is normal and symmetric with the symptomatic  side. No evidence of thrombus. Normal compressibility. Common Femoral Vein: No evidence of thrombus. Normal compressibility, respiratory phasicity and response to augmentation. Saphenofemoral Junction: No evidence of thrombus. Normal compressibility and flow on color Doppler imaging. Profunda Femoral Vein: No evidence of thrombus. Normal compressibility and flow on color Doppler imaging. Femoral Vein: No evidence of thrombus. Normal compressibility, respiratory phasicity and response to augmentation. Popliteal Vein: No evidence of thrombus. Normal compressibility, respiratory phasicity and response to augmentation. Calf Veins: No evidence of thrombus. Normal compressibility and flow on color Doppler imaging. Superficial Great Saphenous Vein: No evidence of thrombus. Normal compressibility and flow on color Doppler imaging. Other Findings:  None. IMPRESSION: Directed duplex right lower extremity negative for DVT Electronically Signed   By: Corrie Mckusick D.O.   On: 01/26/2022 11:21   US ABDOMEN LIMITED RUQ (LIVER/GB)  Result Date: 01/26/2022 CLINICAL DATA:  36 year old female with right upper quadrant abdominal pain since this morning EXAM: ULTRASOUND ABDOMEN LIMITED RIGHT UPPER QUADRANT COMPARISON:  09/19/2021 abdominal sonogram FINDINGS: Gallbladder: Small 5 mm nonshadowing polyp versus adherent sludge ball in the posterior gallbladder. Otherwise normal, with no shadowing gallstones. No gallbladder wall thickening. No pericholecystic fluid. No sonographic Murphy's sign. Common bile duct: Diameter: 2 mm Liver: No focal lesion identified. Within normal limits in parenchymal echogenicity. Portal vein is patent on color Doppler imaging with normal direction of blood flow towards the liver. Other: None. IMPRESSION: 1. Small 5 mm nonshadowing polyp versus adherent sludge ball in the posterior gallbladder. No follow-up required. This recommendation follows ACR consensus guidelines: White Paper of the ACR Incidental  Findings  Committee II on Gallbladder and Biliary Findings. J Am Coll Radiol 2013:;10:[id number]. 2. Otherwise normal right upper quadrant abdominal sonogram. No cholelithiasis. No biliary ductal dilatation. Electronically Signed   By: Ilona Sorrel M.D.   On: 01/26/2022 11:07      ASSESSMENT & PLAN:  1. Acute deep vein thrombosis (DVT) of femoral vein of left lower extremity (HCC)   2. Other acute pulmonary embolism without acute cor pulmonale (HCC)    #Unprovoked acute pulmonary embolism, acute left lower extremity DVT. I agree with Eliquis.  She has tolerated well.  Continue Eliquis 5 mg twice daily, recommend 3 to 6 months of anticoagulation. Patient has had hypercoagulable work-up done during her hospitalization all negative. Morbid obesity potentially contributes to her hypercoagulable states. Recommend future low-dose Eliquis for maintenance after she finishes therapeutic anticoagulation.  Bilateral lower extremity edema.  Left side is worse than right side. Anticipate that patient will have post thrombotic syndrome.  Recommend patient to establish care with vascular surgeon.  Recommend compression stocking.  Patient reports that she is not able to work due to lower extremity symptoms and short-term disability form for 6 weeks was filled and provided to patient.  Orders Placed This Encounter  Procedures   CBC with Differential    Standing Status:   Future    Standing Expiration Date:   01/27/2023   Comprehensive metabolic panel    Standing Status:   Future    Standing Expiration Date:   01/27/2023   Ambulatory referral to Vascular Surgery    Referral Priority:   Routine    Referral Type:   Surgical    Referral Reason:   Specialty Services Required    Referred to Provider:   Algernon Huxley, MD    Requested Specialty:   Vascular Surgery    Number of Visits Requested:   1    All questions were answered. The patient knows to call the clinic with any problems questions or  concerns.   Wayland Denis, PA-C    Return of visit: Follow-up in 3 months. Thank you for this kind referral and the opportunity to participate in the care of this patient. A copy of today's note is routed to referring provider   Earlie Server, MD, PhD [facility] Hematology Oncology 01/27/2022

## 2022-01-29 ENCOUNTER — Encounter: Payer: Self-pay | Admitting: *Deleted

## 2022-01-29 ENCOUNTER — Telehealth: Payer: Self-pay | Admitting: *Deleted

## 2022-01-29 NOTE — Telephone Encounter (Signed)
Short term disability claim form rcvd from patient on 01/27/22. Form completed and faxed on 01/29/22 for patient. I have msg the patient to see if she needs to pick up the original form.

## 2022-02-09 ENCOUNTER — Encounter: Payer: Self-pay | Admitting: *Deleted

## 2022-02-09 NOTE — Telephone Encounter (Signed)
Dr. Tasia Catchings ok with patient to return to work without restrictions, per patient request.

## 2022-02-12 NOTE — Telephone Encounter (Signed)
Dr. Cathie Hoops is recommending for patient to stay out of work for the 6 weeks as discussed during visit.  ?

## 2022-03-12 ENCOUNTER — Encounter: Payer: Self-pay | Admitting: Oncology

## 2022-03-16 ENCOUNTER — Other Ambulatory Visit (INDEPENDENT_AMBULATORY_CARE_PROVIDER_SITE_OTHER): Payer: Self-pay | Admitting: Vascular Surgery

## 2022-03-16 DIAGNOSIS — I82412 Acute embolism and thrombosis of left femoral vein: Secondary | ICD-10-CM

## 2022-03-17 ENCOUNTER — Ambulatory Visit (INDEPENDENT_AMBULATORY_CARE_PROVIDER_SITE_OTHER): Payer: BC Managed Care – PPO

## 2022-03-17 ENCOUNTER — Ambulatory Visit (INDEPENDENT_AMBULATORY_CARE_PROVIDER_SITE_OTHER): Payer: BC Managed Care – PPO | Admitting: Vascular Surgery

## 2022-03-17 ENCOUNTER — Encounter (INDEPENDENT_AMBULATORY_CARE_PROVIDER_SITE_OTHER): Payer: Self-pay | Admitting: Vascular Surgery

## 2022-03-17 VITALS — BP 132/88 | HR 67 | Resp 16 | Wt 302.0 lb

## 2022-03-17 DIAGNOSIS — I82412 Acute embolism and thrombosis of left femoral vein: Secondary | ICD-10-CM

## 2022-03-17 DIAGNOSIS — M79605 Pain in left leg: Secondary | ICD-10-CM | POA: Diagnosis not present

## 2022-03-17 DIAGNOSIS — I2699 Other pulmonary embolism without acute cor pulmonale: Secondary | ICD-10-CM

## 2022-03-17 NOTE — Assessment & Plan Note (Signed)
Duplex today shows resolution of her left lower extremity DVT with no evidence of current DVT.  She also had a small pulmonary embolus when she was seen in the emergency room about 6 to 8 weeks ago.  She is seeing hematology and they will manage her anticoagulation regimen and I have full confidence in them.  She should wear compression socks and a prescription was given today.  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  I will see her back as needed at this point. ?

## 2022-03-17 NOTE — Assessment & Plan Note (Signed)
Has been on anticoagulation now for almost 2 months.  Follows with hematology who will monitor and determine her length and duration of anticoagulation and I have full confidence in them.  I will see her back as needed. ?

## 2022-03-17 NOTE — Progress Notes (Signed)
? ? ?MRN : [id number] ? ?Gina Farmer is a 36 y.o. (05/18/1986) female who presents with chief complaint of  ?Chief Complaint  ?Patient presents with  ? Follow-up  ?  6 week dvt ultrasound follow up  ?. ? ?History of Present Illness: Patient returns today in follow up of her DVT and PE.  She was seen briefly in the emergency department about 6 to 8 weeks ago.  She has been appropriately on anticoagulation since that time and generally doing well.  She does still complain of some left leg pain intermittently.  No significant chest pain or shortness of breath beyond her baseline.  No bleeding issues on anticoagulation.  Duplex today shows resolution of her left lower extremity DVT with no active DVT seen. ? ?Current Outpatient Medications  ?Medication Sig Dispense Refill  ? acetaminophen (TYLENOL) 325 MG tablet Take 2 tablets (650 mg total) by mouth every 6 (six) hours as needed for mild pain.    ? Albuterol Sulfate (PROAIR RESPICLICK) 108 (90 Base) MCG/ACT AEPB Inhale 2 puffs into the lungs every 6 (six) hours as needed (shortness of breath / wheezing).    ? apixaban (ELIQUIS) 5 MG TABS tablet Take 1 tablet (5 mg total) by mouth 2 (two) times daily. 60 tablet 2  ? Cholecalciferol (D3-1000) 25 MCG (1000 UT) capsule Take 1,000 Units by mouth daily.    ? folic acid (FOLVITE) 1 MG tablet Take 1 mg by mouth daily.    ? gabapentin (NEURONTIN) 300 MG capsule Take 300 mg by mouth 2 (two) times daily.    ? hydroxychloroquine (PLAQUENIL) 200 MG tablet Take 200 mg by mouth 2 (two) times daily.    ? methotrexate (RHEUMATREX) 2.5 MG tablet Take 12.5 mg by mouth every Wednesday.    ? ondansetron (ZOFRAN-ODT) 4 MG disintegrating tablet Take 4 mg by mouth every 6 (six) hours as needed for nausea or vomiting.    ? propranolol (INDERAL) 20 MG tablet Take 20 mg by mouth 2 (two) times daily as needed (anxiety).    ? vitamin B-12 (CYANOCOBALAMIN) 1000 MCG tablet Take 1,000 mcg by mouth daily.    ? HYDROcodone-acetaminophen  (NORCO/VICODIN) 5-325 MG tablet Take 1 tablet by mouth every 4 (four) hours as needed for moderate pain. (Patient not taking: Reported on 03/17/2022) 15 tablet 0  ? ?No current facility-administered medications for this visit.  ? ? ?Past Medical History:  ?Diagnosis Date  ? Anxiety   ? Back injury   ? Bipolar 1 disorder (HCC)   ? Bipolar 2 disorder (HCC)   ? Depression   ? Family history of ovarian cancer   ? 2/21 cancer genetic testing letter sent  ? GERD (gastroesophageal reflux disease)   ? Increased pressure in the eye, left   ? Morbid obesity (HCC)   ? Post traumatic stress disorder (PTSD)   ? PTSD (post-traumatic stress disorder)   ? ? ?Past Surgical History:  ?Procedure Laterality Date  ? APPENDECTOMY  2009  ? INTRAOCULAR LENS IMPLANT, SECONDARY    ? from eye injury  ? LAPAROSCOPIC BILATERAL SALPINGO OOPHERECTOMY Bilateral 07/25/2020  ? Procedure: LAPAROSCOPIC BILATERAL SALPINGO OOPHORECTOMY;  Surgeon: Vena Austria, MD;  Location: [facility] ORS;  Service: Gynecology;  Laterality: Bilateral;  ? LAPAROSCOPY  2003  ? LAPAROSCOPY N/A 05/21/2020  ? Procedure: LAPAROSCOPY DIAGNOSTIC;  Surgeon: Vena Austria, MD;  Location: [facility] ORS;  Service: Gynecology;  Laterality: N/A;  ? SACROILIAC JOINT INJECTION    ? ? ? ?Social History  ? ?Tobacco  Use  ? Smoking status: Former  ?  Packs/day: 0.25  ?  Types: Cigarettes  ?  Quit date: 12/19/2020  ?  Years since quitting: 1.2  ? Smokeless tobacco: Never  ?Vaping Use  ? Vaping Use: Some days  ? Last attempt to quit: 05/14/2018  ? Substances: Nicotine, THC, Flavoring  ?Substance Use Topics  ? Alcohol use: Not Currently  ?  Comment: social  ? Drug use: Yes  ?  Types: Marijuana  ?  Comment: occ  ? ? ? ? ?Family History  ?Problem Relation Age of Onset  ? Hypertension Father   ? Heart disease Father   ? Narcolepsy Father   ? Obesity Father   ? Hypertension Brother   ? Obesity Maternal Aunt   ? Ovarian cancer Maternal Aunt 40  ? Obesity Paternal Aunt   ? Heart disease Maternal Grandfather    ? Ovarian cancer Paternal Grandmother   ?     53s  ? Heart disease Paternal Grandfather   ? Uterine cancer Other   ? ? ? ?Allergies  ?Allergen Reactions  ? Lamotrigine Swelling  ? Viibryd [Vilazodone Hcl] Swelling  ?  Per pt "leg swelling"  ? ? ? ?REVIEW OF SYSTEMS (Negative unless checked) ? ?Constitutional: [] Weight loss  [] Fever  [] Chills ?Cardiac: [] Chest pain   [] Chest pressure   [] Palpitations   [] Shortness of breath when laying flat   [] Shortness of breath at rest   [] Shortness of breath with exertion. ?Vascular:  [] Pain in legs with walking   [x] Pain in legs at rest   [] Pain in legs when laying flat   [] Claudication   [] Pain in feet when walking  [] Pain in feet at rest  [] Pain in feet when laying flat   [x] History of DVT   [x] Phlebitis   [x] Swelling in legs   [] Varicose veins   [] Non-healing ulcers ?Pulmonary:   [] Uses home oxygen   [] Productive cough   [] Hemoptysis   [] Wheeze  [] COPD   [] Asthma ?Neurologic:  [] Dizziness  [] Blackouts   [] Seizures   [] History of stroke   [] History of TIA  [] Aphasia   [] Temporary blindness   [] Dysphagia   [] Weakness or numbness in arms   [] Weakness or numbness in legs ?Musculoskeletal:  [x] Arthritis   [] Joint swelling   [x] Joint pain   [] Low back pain ?Hematologic:  [] Easy bruising  [] Easy bleeding   [] Hypercoagulable state   [] Anemic   ?Gastrointestinal:  [] Blood in stool   [] Vomiting blood  [] Gastroesophageal reflux/heartburn   [] Abdominal pain ?Genitourinary:  [] Chronic kidney disease   [] Difficult urination  [] Frequent urination  [] Burning with urination   [] Hematuria ?Skin:  [] Rashes   [] Ulcers   [] Wounds ?Psychological:  [] History of anxiety   []  History of major depression. ? ?Physical Examination ? ?BP 132/88 (BP Location: Right Arm)   Pulse 67   Resp 16   Wt (!) 302 lb (137 kg)   BMI 57.06 kg/m?  ?Gen:  WD/WN, NAD.  Morbidly obese ?Head: [place]/AT, No temporalis wasting. ?Ear/Nose/Throat: Hearing grossly intact, nares w/o erythema or drainage ?Eyes: Conjunctiva  clear. Sclera non-icteric ?Neck: Supple.  Trachea midline ?Pulmonary:  Good air movement, no use of accessory muscles.  ?Cardiac: RRR, no JVD ?Vascular:  ?Vessel Right Left  ?Radial Palpable Palpable  ?    ?    ?    ? ?Musculoskeletal: M/S 5/5 throughout.  No deformity or atrophy.  Trace lower extremity edema. ?Neurologic: Sensation grossly intact in extremities.  Symmetrical.  Speech is fluent.  ?Psychiatric:  Judgment intact, Mood & affect appropriate for pt's clinical situation. ?Dermatologic: No rashes or ulcers noted.  No cellulitis or open wounds. ? ? ? ? ? ?Labs ?Recent Results (from the past 2160 hour(s))  ?CBC with Differential     Status: Abnormal  ? Collection Time: 01/16/22  3:32 PM  ?Result Value Ref Range  ? WBC 13.9 (H) 4.0 - 10.5 K/uL  ? RBC 4.97 3.87 - 5.11 MIL/uL  ? Hemoglobin 14.2 12.0 - 15.0 g/dL  ? HCT 44.5 36.0 - 46.0 %  ? MCV 89.5 80.0 - 100.0 fL  ? MCH 28.6 26.0 - 34.0 pg  ? MCHC 31.9 30.0 - 36.0 g/dL  ? RDW 13.9 11.5 - 15.5 %  ? Platelets 214 150 - 400 K/uL  ? nRBC 0.0 0.0 - 0.2 %  ? Neutrophils Relative % 73 %  ? Neutro Abs 10.0 (H) 1.7 - 7.7 K/uL  ? Lymphocytes Relative 19 %  ? Lymphs Abs 2.7 0.7 - 4.0 K/uL  ? Monocytes Relative 7 %  ? Monocytes Absolute 1.0 0.1 - 1.0 K/uL  ? Eosinophils Relative 1 %  ? Eosinophils Absolute 0.2 0.0 - 0.5 K/uL  ? Basophils Relative 0 %  ? Basophils Absolute 0.0 0.0 - 0.1 K/uL  ? Immature Granulocytes 0 %  ? Abs Immature Granulocytes 0.06 0.00 - 0.07 K/uL  ?  Comment: Performed at [facility], [address] [place], [place] [id number]  ?Basic metabolic panel     Status: Abnormal  ? Collection Time: 01/16/22  3:32 PM  ?Result Value Ref Range  ? Sodium 140 135 - 145 mmol/L  ? Potassium 4.1 3.5 - 5.1 mmol/L  ? Chloride 109 98 - 111 mmol/L  ? CO2 24 22 - 32 mmol/L  ? Glucose, Bld 90 70 - 99 mg/dL  ?  Comment: Glucose reference range applies only to samples taken after fasting for at least 8 hours.  ? BUN 17 6 - 20 mg/dL  ? Creatinine, Ser 0.74 0.44 -  1.00 mg/dL  ? Calcium 8.5 (L) 8.9 - 10.3 mg/dL  ? GFR, Estimated >60 >60 mL/min  ?  Comment: (NOTE) ?Calculated using the CKD-EPI Creatinine Equation (2021) ?  ? Anion gap 7 5 - 15  ?  Comment: Performed at [facility]

## 2022-03-17 NOTE — Assessment & Plan Note (Signed)
Likely a combination of her recent DVT and some postphlebitic symptoms, her chronic pain syndrome, and she also has had Baker's cyst in the past so there may be some component of arthritis.  Compression socks and elevation as tolerated. ?

## 2022-04-28 ENCOUNTER — Inpatient Hospital Stay: Payer: BC Managed Care – PPO | Admitting: Oncology

## 2022-04-28 ENCOUNTER — Inpatient Hospital Stay: Payer: BC Managed Care – PPO | Attending: Oncology

## 2022-04-28 ENCOUNTER — Encounter: Payer: Self-pay | Admitting: Oncology

## 2022-05-03 ENCOUNTER — Emergency Department: Payer: BC Managed Care – PPO

## 2022-05-03 ENCOUNTER — Emergency Department
Admission: EM | Admit: 2022-05-03 | Discharge: 2022-05-03 | Disposition: A | Discharge status: Home / Self Care | Payer: BC Managed Care – PPO | Attending: Emergency Medicine | Admitting: Emergency Medicine

## 2022-05-03 DIAGNOSIS — Z86718 Personal history of other venous thrombosis and embolism: Secondary | ICD-10-CM | POA: Insufficient documentation

## 2022-05-03 DIAGNOSIS — R0789 Other chest pain: Secondary | ICD-10-CM | POA: Diagnosis not present

## 2022-05-03 DIAGNOSIS — R0602 Shortness of breath: Secondary | ICD-10-CM | POA: Insufficient documentation

## 2022-05-03 DIAGNOSIS — Z7901 Long term (current) use of anticoagulants: Secondary | ICD-10-CM | POA: Insufficient documentation

## 2022-05-03 DIAGNOSIS — M79605 Pain in left leg: Secondary | ICD-10-CM | POA: Insufficient documentation

## 2022-05-03 LAB — COMPREHENSIVE METABOLIC PANEL
ALT: 20 U/L (ref 0–44)
AST: 20 U/L (ref 15–41)
Albumin: 3.3 g/dL — ABNORMAL LOW (ref 3.5–5.0)
Alkaline Phosphatase: 72 U/L (ref 38–126)
Anion gap: 5 (ref 5–15)
BUN: 9 mg/dL (ref 6–20)
CO2: 26 mmol/L (ref 22–32)
Calcium: 8.6 mg/dL — ABNORMAL LOW (ref 8.9–10.3)
Chloride: 108 mmol/L (ref 98–111)
Creatinine, Ser: 0.73 mg/dL (ref 0.44–1.00)
GFR, Estimated: >60 mL/min (ref >60)
Glucose, Bld: 149 mg/dL — ABNORMAL HIGH (ref 70–99)
Potassium: 3.1 mmol/L — ABNORMAL LOW (ref 3.5–5.1)
Sodium: 139 mmol/L (ref 135–145)
Total Bilirubin: 0.5 mg/dL (ref 0.3–1.2)
Total Protein: 6.3 g/dL — ABNORMAL LOW (ref 6.5–8.1)

## 2022-05-03 LAB — CBC
HCT: 44.8 % (ref 36.0–46.0)
Hemoglobin: 14 g/dL (ref 12.0–15.0)
MCH: 26.8 pg (ref 26.0–34.0)
MCHC: 31.3 g/dL (ref 30.0–36.0)
MCV: 85.8 fL (ref 80.0–100.0)
Platelets: 258 10*3/uL (ref 150–400)
RBC: 5.22 MIL/uL — ABNORMAL HIGH (ref 3.87–5.11)
RDW: 13.5 % (ref 11.5–15.5)
WBC: 10.7 10*3/uL — ABNORMAL HIGH (ref 4.0–10.5)
nRBC: 0 % (ref 0.0–0.2)

## 2022-05-03 LAB — TROPONIN I (HIGH SENSITIVITY)
Troponin I (High Sensitivity): 4 ng/L (ref <18)
Troponin I (High Sensitivity): 5 ng/L (ref <18)

## 2022-05-03 MED ORDER — IOHEXOL 350 MG/ML SOLN
75.0000 mL | Freq: Once | INTRAVENOUS | Status: AC | PRN
  Administered 2022-05-03: 75 mL via INTRAVENOUS

## 2022-05-03 MED ORDER — APIXABAN 5 MG PO TABS: 5.0000 mg | ORAL_TABLET | Freq: Two times a day (BID) | ORAL | 2 refills | Status: DC

## 2022-05-03 NOTE — ED Triage Notes (Signed)
First Nurse Note:  Pt via EMS from home. Pt c/o L leg pain. Pt has a hx DVT in her legs. Pt states its been going on for 4 days. Pt is suppose to be on Eliquis but has not been taking it for the past couple of weeks. Pt is A&Ox4 and NAD  139/90 89 HR 99% on RA

## 2022-05-03 NOTE — ED Provider Notes (Signed)
[facility] Provider Note  Patient Contact: 7:56 PM (approximate)   History   Leg Pain, Chest Pain, and Shortness of Breath   HPI  Gina Farmer is a 36 y.o. female presents to the emergency department with concern for leg pain and shortness of breath.  Patient has prior history of DVT and PE and states that she has been noncompliant with her Eliquis for the past 2 weeks.  She reports that her car has been in the shop for the past 2 weeks and she ran out of her Eliquis and did not want to have to ask family or friends to drive her to the pharmacy.  She states that she mostly notices shortness of breath when she is ambulating upstairs but states that she has had some shortness of breath at rest to.  She has some mild anterior chest wall discomfort as well.      Physical Exam   Triage Vital Signs: ED Triage Vitals  Enc Vitals Group     BP 05/03/22 1639 119/73     Pulse Rate 05/03/22 1639 78     Resp 05/03/22 1639 18     Temp 05/03/22 1639 98.5 F (36.9 C)     Temp Source 05/03/22 1639 Oral     SpO2 05/03/22 1639 97 %     Weight --      Height --      Head Circumference --      Peak Flow --      Pain Score 05/03/22 1640 5     Pain Loc --      Pain Edu? --      Excl. in [place]? --     Most recent vital signs: Vitals:   05/03/22 2000 05/03/22 2133  BP:  120/70  Pulse: 71 70  Resp:  18  Temp:    SpO2: 94% 100%     General: Alert and in no acute distress. Eyes:  PERRL. EOMI. Head: No acute traumatic findings ENT:      Nose: No congestion/rhinnorhea.      Mouth/Throat: Mucous membranes are moist. Neck: No stridor. No cervical spine tenderness to palpation. Cardiovascular:  Good peripheral perfusion Respiratory: Normal respiratory effort without tachypnea or retractions. Lungs CTAB. Good air entry to the bases with no decreased or absent breath sounds. Gastrointestinal: Bowel sounds 4 quadrants. Soft and nontender to palpation. No guarding or  rigidity. No palpable masses. No distention. No CVA tenderness. Musculoskeletal: Full range of motion to all extremities.  Neurologic:  No gross focal neurologic deficits are appreciated.  Skin:   No rash noted Other:   ED Results / Procedures / Treatments   Labs (all labs ordered are listed, but only abnormal results are displayed) Labs Reviewed  CBC - Abnormal; Notable for the following components:      Result Value   WBC 10.7 (*)    RBC 5.22 (*)    All other components within normal limits  COMPREHENSIVE METABOLIC PANEL - Abnormal; Notable for the following components:   Potassium 3.1 (*)    Glucose, Bld 149 (*)    Calcium 8.6 (*)    Total Protein 6.3 (*)    Albumin 3.3 (*)    All other components within normal limits  TROPONIN I (HIGH SENSITIVITY)  TROPONIN I (HIGH SENSITIVITY)     EKG  Normal sinus rhythm without st segment elevation or other apparent arrhythmia.   RADIOLOGY  I personally viewed and evaluated these images  as part of my medical decision making, as well as reviewing the written report by the radiologist.  ED Provider Interpretation: I personally interpreted CTA and venous ultrasound of the left lower extremity and agree with radiologist interpretation.  No acute PE and no acute DVT.   PROCEDURES:  Critical Care performed: No  Procedures   MEDICATIONS ORDERED IN ED: Medications  iohexol (OMNIPAQUE) 350 MG/ML injection 75 mL (75 mLs Intravenous Contrast Given 05/03/22 2009)     IMPRESSION / MDM / ASSESSMENT AND PLAN / ED COURSE  I reviewed the triage vital signs and the nursing notes.                              Assessment and plan:  SOB:  Leg pain 36 year old female presents to the emergency department with concern for possible new DVT or PE since she has been compliant with her Eliquis for the past 2 weeks.  Vital signs are reassuring at triage.  EKG indicates normal sinus rhythm without ST segment elevation or other apparent  arrhythmia.  CMP indicated mild hypokalemia but was otherwise reassuring.  CBC within reference range.  Delta troponin reassuring.  Patient was given a refill of her Eliquis at patient's request and advised to follow-up with primary care.  Return precautions were given to return with new or worsening symptoms.   FINAL CLINICAL IMPRESSION(S) / ED DIAGNOSES   Final diagnoses:  Shortness of breath     Rx / DC Orders   ED Discharge Orders          Ordered    apixaban (ELIQUIS) 5 MG TABS tablet  2 times daily        05/03/22 2120             Note:  This document was prepared using Dragon voice recognition software and may include unintentional dictation errors.   Vallarie Mare [place], PA-C 05/03/22 2241    Naaman Plummer, MD 05/05/22 1525

## 2022-05-03 NOTE — ED Notes (Signed)
Pt in ultrasound

## 2022-05-03 NOTE — ED Notes (Signed)
Blue top sent. 

## 2022-05-03 NOTE — ED Notes (Signed)
Pt in US at this time 

## 2022-05-03 NOTE — ED Notes (Signed)
No answer when called for rooming, walked around lobby, no pt identifies as Gina Farmer.

## 2022-05-03 NOTE — ED Triage Notes (Signed)
See first nurse note. Pt also has shob and chest pain but states she has anxiety and chest pain frequently. Hx of 2 pulm embs.

## 2022-05-03 NOTE — ED Notes (Signed)
Pt states has been having chest pain when she exerts herself for several days. Pt has a history of PE. Pt states she has been having shob, chest pain when she walks up her stairs or vaccuums her house.

## 2022-05-04 ENCOUNTER — Other Ambulatory Visit: Payer: Self-pay | Admitting: Advanced Practice Midwife

## 2022-05-04 DIAGNOSIS — Z87898 Personal history of other specified conditions: Secondary | ICD-10-CM

## 2022-05-04 NOTE — Progress Notes (Signed)
Referral to sleep study. Previously ordered by [facility]MS in September 2022.

## 2022-05-18 ENCOUNTER — Ambulatory Visit: Payer: Self-pay | Admitting: Internal Medicine

## 2022-05-18 NOTE — Progress Notes (Unsigned)
Sleep Medicine   Office Visit  Patient Name: Gina Farmer DOB: 09/08/1965 MRN [id number]    Chief Complaint: ***  Brief History:  Gina presents for initial sleep consult with a *** history of ***. Sleep quality is ***. This is noted *** nights. The patient's bed partner reports  *** at night. The patient relates the following symptoms: *** are also present. The patient goes to sleep at *** and wakes up at ***.   Sleep quality is *** when outside home environment.  Patient has noted *** of her legs at night.  The patient  relates *** behavior during the night.  The patient *** a history of psychiatric problems. The Epworth Sleepiness Score is *** out of 24 .  The patient relates  Cardiovascular risk factors include: *** The patient reports ***    ROS  General: (-) fever, (-) chills, (-) night sweat Nose and Sinuses: (-) nasal stuffiness or itchiness, (-) postnasal drip, (-) nosebleeds, (-) sinus trouble. Mouth and Throat: (-) sore throat, (-) hoarseness. Neck: (-) swollen glands, (-) enlarged thyroid, (-) neck pain. Respiratory: *** cough, *** shortness of breath, *** wheezing. Neurologic: *** numbness, *** tingling. Psychiatric: *** anxiety, *** depression Sleep behavior: ***sleep paralysis ***hypnogogic hallucinations ***dream enactment      ***vivid dreams ***cataplexy ***night terrors ***sleep walking   Current Medication: No outpatient encounter medications on file as of 02/04/2023.   No facility-administered encounter medications on file as of 02/04/2023.    Surgical History: *** The histories are not reviewed yet. Please review them in the "History" navigator section and refresh this SmartLink.  Medical History: No past medical history on file.  Family History: Non contributory to the present illness  Social History: Social History   Socioeconomic History   Marital status: Not on file    Spouse name: Not on file   Number of children: Not on file   Years of  education: Not on file   Highest education level: Not on file  Occupational History   Not on file  Tobacco Use   Smoking status: Not on file   Smokeless tobacco: Not on file  Substance and Sexual Activity   Alcohol use: Not on file   Drug use: Not on file   Sexual activity: Not on file  Other Topics Concern   Not on file  Social History Narrative   Not on file   Social Determinants of Health   Financial Resource Strain: Not on file  Food Insecurity: Not on file  Transportation Needs: Not on file  Physical Activity: Not on file  Stress: Not on file  Social Connections: Not on file  Intimate Partner Violence: Not on file    Vital Signs: There were no vitals taken for this visit. There is no height or weight on file to calculate BMI.   Examination: General Appearance: The patient is well-developed, well-nourished, and in no distress. Neck Circumference: *** Skin: Gross inspection of skin unremarkable. Head: normocephalic, no gross deformities. Eyes: no gross deformities noted. ENT: ears appear grossly normal Neurologic: Alert and oriented. No involuntary movements.    STOP BANG RISK ASSESSMENT S (snore) Have you been told that you snore?     YES/N   T (tired) Are you often tired, fatigued, or sleepy during the day?   YES/NO  O (obstruction) Do you stop breathing, choke, or gasp during sleep? YES/NO   P (pressure) Do you have or are you being treated for high blood pressure? YES/NO   B (  BMI) Is your body index greater than 35 kg/m? YES/NO   A (age) Are you 50 years old or older? YES/NO   N (neck) Do you have a neck circumference greater than 16 inches?   YES/NO   G (gender) Are you a female? YES/NO   TOTAL STOP/BANG "YES" ANSWERS                                                                A STOP-Bang score of 2 or less is considered low risk, and a score of 5 or more is high risk for having either moderate or severe OSA. For people who score 3 or 4,  doctors may need to perform further assessment to determine how likely they are to have OSA.         EPWORTH SLEEPINESS SCALE:  Scale:  (0)= no chance of dozing; (1)= slight chance of dozing; (2)= moderate chance of dozing; (3)= high chance of dozing  Chance  Situtation    Sitting and reading: ***    Watching TV: ***    Sitting Inactive in public: ***    As a passenger in car: ***      Lying down to rest: ***    Sitting and talking: ***    Sitting quielty after lunch: ***    In a car, stopped in traffic: ***   TOTAL SCORE:   *** out of 24    SLEEP STUDIES:  ***   LABS: No results found for this or any previous visit (from the past 2160 hour(s)).  Radiology: Patient was never admitted.  No results found.  No results found.    Assessment and Plan: There are no problems to display for this patient.    PLAN OSA:   Patient evaluation suggests high risk of sleep disordered breathing due to *** Patient has comorbid cardiovascular risk factors including: *** which could be exacerbated by pathologic sleep-disordered breathing.  Suggest: *** to assess/treat the patient's sleep disordered breathing. The patient was also counselled on *** to optimize sleep health.  PLAN hypersomnia:  Patient evaluation suggests significant daytime hypersomnia.  The Epworth Sleepiness Score is elevated at *** out of 24. Patient *** drowsy driving. The patient *** MVA due to sleepiness.  The patient *** restless leg symptoms which exacerbate *** for *** nights per week. The patient *** periodic limb movements which exacerbate ***  for *** nights per week. Suggest: ***  Also suggest ***  PLAN insomnia:  Patient evaluation suggests *** insomnia. This is a chronic disorder. This has been a concern for *** and causes impaired daytime functioning. The patient exhibits comorbid ***  The history *** suggest the insomnia predates the use of hypnotic medications. The symptoms *** with the  discontinuation of these medications. There is no obvious medical, psychiatric or pharmacologic abuse issues ot account for the insomnia.  Treatment recommendations include: *** The patient should maintain a sleep log and calculate total sleep time for 1-2 weeks. Set bed and wake times for achieve 85% sleep efficiency for one week. Once this is achieved  time in bed can be gradually increased. A pharmacologic treatment approach would include a trial of *** for the next ***  months. During this time the patient is to maintain a sleep   diary to track progress.    ***  General Counseling: I have discussed the findings of the evaluation and examination with Gina.  I have also discussed any further diagnostic evaluation thatmay be needed or ordered today. Gina verbalizes understanding of the findings of todays visit. We also reviewed her medications today and discussed drug interactions and side effects including but not limited excessive drowsiness and altered mental states. We also discussed that there is always a risk not just to her but also people around her. she has been encouraged to call the office with any questions or concerns that should arise related to todays visit.  No orders of the defined types were placed in this encounter.       I have personally obtained a history, evaluated the patient, evaluated pertinent data, formulated the assessment and plan and placed orders.    Saadat A Khan, MD FCCP Diplomate ABMS Pulmonary and Critical Care Medicine Sleep medicine  

## 2022-05-22 ENCOUNTER — Telehealth: Payer: Self-pay

## 2022-05-22 DIAGNOSIS — I82412 Acute embolism and thrombosis of left femoral vein: Secondary | ICD-10-CM

## 2022-05-22 NOTE — Telephone Encounter (Signed)
-----   Message from Rickard Patience, MD sent at 05/21/2022  9:02 PM EDT ----- Lost follow up?  Please schedule her for lab MD in June, follow up, lab cbc cmp MD

## 2022-05-22 NOTE — Telephone Encounter (Signed)
Please schedule pt for lab/MD in June and inform pt of appt.

## 2022-06-04 ENCOUNTER — Inpatient Hospital Stay (HOSPITAL_BASED_OUTPATIENT_CLINIC_OR_DEPARTMENT_OTHER): Payer: BC Managed Care – PPO | Admitting: Oncology

## 2022-06-04 ENCOUNTER — Ambulatory Visit
Admission: RE | Admit: 2022-06-04 | Discharge: 2022-06-04 | Disposition: A | Discharge status: Home / Self Care | Payer: BC Managed Care – PPO | Source: Ambulatory Visit | Attending: Oncology | Admitting: Oncology

## 2022-06-04 ENCOUNTER — Encounter: Payer: Self-pay | Admitting: Oncology

## 2022-06-04 ENCOUNTER — Inpatient Hospital Stay: Payer: BC Managed Care – PPO | Attending: Oncology

## 2022-06-04 VITALS — BP 124/63 | HR 62 | Temp 97.3°F | Ht 61.0 in | Wt 295.0 lb

## 2022-06-04 DIAGNOSIS — Z79899 Other long term (current) drug therapy: Secondary | ICD-10-CM | POA: Insufficient documentation

## 2022-06-04 DIAGNOSIS — M7989 Other specified soft tissue disorders: Secondary | ICD-10-CM | POA: Diagnosis not present

## 2022-06-04 DIAGNOSIS — R600 Localized edema: Secondary | ICD-10-CM | POA: Diagnosis not present

## 2022-06-04 DIAGNOSIS — I517 Cardiomegaly: Secondary | ICD-10-CM | POA: Diagnosis not present

## 2022-06-04 DIAGNOSIS — M79605 Pain in left leg: Secondary | ICD-10-CM

## 2022-06-04 DIAGNOSIS — I82532 Chronic embolism and thrombosis of left popliteal vein: Secondary | ICD-10-CM | POA: Insufficient documentation

## 2022-06-04 DIAGNOSIS — Z8049 Family history of malignant neoplasm of other genital organs: Secondary | ICD-10-CM | POA: Diagnosis not present

## 2022-06-04 DIAGNOSIS — Z86711 Personal history of pulmonary embolism: Secondary | ICD-10-CM | POA: Insufficient documentation

## 2022-06-04 DIAGNOSIS — Z8041 Family history of malignant neoplasm of ovary: Secondary | ICD-10-CM | POA: Insufficient documentation

## 2022-06-04 DIAGNOSIS — I82412 Acute embolism and thrombosis of left femoral vein: Secondary | ICD-10-CM

## 2022-06-04 DIAGNOSIS — I2699 Other pulmonary embolism without acute cor pulmonale: Secondary | ICD-10-CM | POA: Diagnosis not present

## 2022-06-04 DIAGNOSIS — Z8349 Family history of other endocrine, nutritional and metabolic diseases: Secondary | ICD-10-CM | POA: Diagnosis not present

## 2022-06-04 DIAGNOSIS — I513 Intracardiac thrombosis, not elsewhere classified: Secondary | ICD-10-CM | POA: Insufficient documentation

## 2022-06-04 DIAGNOSIS — I2693 Single subsegmental pulmonary embolism without acute cor pulmonale: Secondary | ICD-10-CM | POA: Diagnosis present

## 2022-06-04 DIAGNOSIS — R0789 Other chest pain: Secondary | ICD-10-CM | POA: Insufficient documentation

## 2022-06-04 DIAGNOSIS — Z87891 Personal history of nicotine dependence: Secondary | ICD-10-CM | POA: Insufficient documentation

## 2022-06-04 DIAGNOSIS — I82442 Acute embolism and thrombosis of left tibial vein: Secondary | ICD-10-CM | POA: Diagnosis present

## 2022-06-04 DIAGNOSIS — Z8249 Family history of ischemic heart disease and other diseases of the circulatory system: Secondary | ICD-10-CM | POA: Insufficient documentation

## 2022-06-04 DIAGNOSIS — Z9049 Acquired absence of other specified parts of digestive tract: Secondary | ICD-10-CM | POA: Diagnosis not present

## 2022-06-04 LAB — CBC WITH DIFFERENTIAL/PLATELET
Abs Immature Granulocytes: 0.02 10*3/uL (ref 0.00–0.07)
Basophils Absolute: 0.1 10*3/uL (ref 0.0–0.1)
Basophils Relative: 1 %
Eosinophils Absolute: 0.3 10*3/uL (ref 0.0–0.5)
Eosinophils Relative: 3 %
HCT: 43.7 % (ref 36.0–46.0)
Hemoglobin: 14.1 g/dL (ref 12.0–15.0)
Immature Granulocytes: 0 %
Lymphocytes Relative: 25 %
Lymphs Abs: 2.6 10*3/uL (ref 0.7–4.0)
MCH: 27.1 pg (ref 26.0–34.0)
MCHC: 32.3 g/dL (ref 30.0–36.0)
MCV: 84 fL (ref 80.0–100.0)
Monocytes Absolute: 0.6 10*3/uL (ref 0.1–1.0)
Monocytes Relative: 6 %
Neutro Abs: 6.9 10*3/uL (ref 1.7–7.7)
Neutrophils Relative %: 65 %
Platelets: 308 10*3/uL (ref 150–400)
RBC: 5.2 MIL/uL — ABNORMAL HIGH (ref 3.87–5.11)
RDW: 13.9 % (ref 11.5–15.5)
WBC: 10.5 10*3/uL (ref 4.0–10.5)
nRBC: 0 % (ref 0.0–0.2)

## 2022-06-04 LAB — COMPREHENSIVE METABOLIC PANEL
ALT: 19 U/L (ref 0–44)
AST: 18 U/L (ref 15–41)
Albumin: 3.6 g/dL (ref 3.5–5.0)
Alkaline Phosphatase: 75 U/L (ref 38–126)
Anion gap: 8 (ref 5–15)
BUN: 16 mg/dL (ref 6–20)
CO2: 24 mmol/L (ref 22–32)
Calcium: 8.8 mg/dL — ABNORMAL LOW (ref 8.9–10.3)
Chloride: 105 mmol/L (ref 98–111)
Creatinine, Ser: 0.78 mg/dL (ref 0.44–1.00)
GFR, Estimated: >60 mL/min (ref >60)
Glucose, Bld: 109 mg/dL — ABNORMAL HIGH (ref 70–99)
Potassium: 3.8 mmol/L (ref 3.5–5.1)
Sodium: 137 mmol/L (ref 135–145)
Total Bilirubin: 0.4 mg/dL (ref 0.3–1.2)
Total Protein: 7.1 g/dL (ref 6.5–8.1)

## 2022-06-04 MED ORDER — APIXABAN 2.5 MG PO TABS: 2.5000 mg | ORAL_TABLET | Freq: Two times a day (BID) | ORAL | 6 refills | Status: DC

## 2022-06-04 NOTE — Assessment & Plan Note (Addendum)
Recommend patient to resume Eliquis, she may decrease to 2.5 mg twice daily. Patient has had hypercoagulable work-up done during her hospitalization all negative.

## 2022-06-04 NOTE — Assessment & Plan Note (Addendum)
Patient continues to have lower extremity edema, left worse than right. Left lower extremity pain. Given that she has been off Eliquis for 2 months, I recommend ultrasound left lower extremity to rule out recurrent DVT. 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  No acute DVT. Recommend to resume Eliquis, recommend to decrease to 2.5 mg twice daily for long-term prophylaxis. Her symptoms are likely posttraumatic syndrome.  Patient was previously cleared by Dr. Wyn Quaker to go back to work.  Due to the worse of her symptoms, I recommend patient to make a follow-up appointment with Dr.Dew to further evaluate necessity of short-term disabilities and also for treatment of her symptoms.  Patient agrees with the plan.

## 2022-06-04 NOTE — Assessment & Plan Note (Signed)
Refer to cardiology for further evaluation.

## 2022-06-04 NOTE — Progress Notes (Signed)
. Hematology/Oncology Consult note [phone] [phone]         Patient Care Team: Cyndia Diver, PA-C as PCP - General (Family Medicine) Rickard Patience, MD as Consulting Physician (Hematology)  ASSESSMENT & PLAN:   Acute pulmonary embolism [facility]) Recommend patient to resume Eliquis, she may decrease to 2.5 mg twice daily. Patient has had hypercoagulable work-up done during her hospitalization all negative.  DVT (deep venous thrombosis) (HCC) Patient continues to have lower extremity edema, left worse than right. Left lower extremity pain. Given that she has been off Eliquis for 2 months, I recommend ultrasound left lower extremity to rule out recurrent DVT. 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  No acute DVT. Recommend to resume Eliquis, recommend to decrease to 2.5 mg twice daily for long-term prophylaxis. Her symptoms are likely posttraumatic syndrome.  Patient was previously cleared by Dr. Wyn Quaker to go back to work.  Due to the worse of her symptoms, I recommend patient to make a follow-up appointment with Dr.Dew to further evaluate necessity of short-term disabilities and also for treatment of her symptoms.  Patient agrees with the plan.  Cardiomegaly Refer to cardiology for further evaluation.  Orders Placed This Encounter  Procedures   US Venous Img Lower Unilateral Left    Standing Status:   Future    Number of Occurrences:   1    Standing Expiration Date:   06/04/2023    Order Specific Question:   Reason for Exam (SYMPTOM  OR DIAGNOSIS REQUIRED)    Answer:   leg pain    Order Specific Question:   Preferred imaging location?    Answer:   [facility]    Order Specific Question:   Call Results- Best Contact Number?    Answer:   [phone] pt can leave   Ambulatory referral to Cardiology    Referral Priority:   Routine    Referral Type:   Consultation    Referral Reason:   Specialty Services  Required    Requested Specialty:   Cardiology    Number of Visits Requested:   1   Follow up in 6 months.  All questions were answered. The patient knows to call the clinic with any problems, questions or concerns.  Rickard Patience, MD, PhD [facility] Hematology Oncology 06/04/2022    CHIEF COMPLAINTS/REASON FOR VISIT:  pulmonary embolism and lower extremity DVT.  HISTORY OF PRESENTING ILLNESS:   Gina Farmer is a  36 y.o.  female with PMH listed below was seen in consultation at the request of  Cyndia Diver, PA-C  for evaluation of acute pulmonary embolism and lower extremity DVT.  01/16/2022, patient presented to ER for evaluation of left lower extremity pain.  Denies any immobilization factors. Ultrasound left lower extremity Doppler showed acute occlusive DVT involving the caudal course of left femoral vein, left popliteal vein and left posterior tibial vein.  01/16/2022, CT angio gram chest PE protocol showed acute pulmonary embolus, within the segmental right lower lobe and subsegmental right upper and lower lobe branches.  Small volume clot burden.  No right heart strain. Patient was admitted and started on IV heparin.  Transition to Eliquis at discharge on 01/17/2022..  During her hospitalization, patient had hypercoagulable work-up including negative antiphospholipid syndrome panel, negative prothrombin gene mutation, negative factor V Leiden mutation, normal protein C and S activity and antigen level.  Normal Antithrombin III level.  Patient presented to emergency room on 01/19/2022 due to chest pain  and had repeat CT chest angiogram which showed no evidence of worsening.  01/19/2022 left lower extremity ultrasound showed occlusive thrombus of the distal left femoral vein and popliteal vein.  01/26/2022, patient again presented emergency room for evaluation of right lower chest pain and shortness of breath. 01/26/2022 repeat CT chest angiogram showed decreased clot burden.  Right lower  extremity ultrasound was also negative for DVT. She was also seen by vascular surgeon Dr. Lucky Cowboy who felt that patient is not a thrombectomy candidate due to location of the clot.  Today patient presents to hematology clinic for evaluation.  She denies chest pain today.  No worsening of shortness of breath.  She continues to have left lower extremity swelling/pain.  She intermittently elevates her leg with some symptom improvement..  She has baseline right lower extremity swelling as well.  Patient works at [employer] and spends most of the time during her work.  She feels that she is not able to continue her work due to the symptoms. Patient has Family history of ovarian cancer.  She has endometriosis and has had salpingo-oophorectomy bilaterally.  No immobilization factors could be found to attribute to her DVT and PE.  INTERVAL HISTORY Gina Farmer is a 36 y.o. female who has above history reviewed by me today presents for follow up visit for history of DVT and pulmonary embolism. Since last visit in February 2023, patient took about 2 months of Eliquis 5 mg twice daily, self decreased to Eliquis 5 mg daily so that her supply can last longer.  She ran out of Eliquis. She has had chest pain and worsening of left lower extremity edema ER visit 05/03/22 CTA negative for PE, mild cardiomegaly, chronic stable left adrenal adenoma. Left lower US showed residua of a prior thrombosis noted on 01/19/2022.No superimposed acute femoropopliteal thrombosis identified. She was prescribe for Eliquis Rx and did not pick up. Currently off Elquis.   Review of Systems  Constitutional:  Negative for appetite change, chills, fatigue and fever.  HENT:   Negative for hearing loss and voice change.   Eyes:  Negative for eye problems.  Respiratory:  Negative for chest tightness and cough.   Cardiovascular:  Positive for leg swelling. Negative for chest pain.  Gastrointestinal:  Negative for abdominal distention, abdominal  pain and blood in stool.  Endocrine: Negative for hot flashes.  Genitourinary:  Negative for difficulty urinating and frequency.   Musculoskeletal:  Negative for arthralgias.  Skin:  Negative for itching and rash.  Neurological:  Negative for extremity weakness.  Hematological:  Negative for adenopathy.  Psychiatric/Behavioral:  Negative for confusion.     MEDICAL HISTORY:  Past Medical History:  Diagnosis Date   Anxiety    Back injury    Bipolar 1 disorder ([place])    Bipolar 2 disorder ([place])    Depression    Family history of ovarian cancer    2/21 cancer genetic testing letter sent   GERD (gastroesophageal reflux disease)    Increased pressure in the eye, left    Morbid obesity ([place])    Post traumatic stress disorder (PTSD)    PTSD (post-traumatic stress disorder)     SURGICAL HISTORY: Past Surgical History:  Procedure Laterality Date   APPENDECTOMY  2009   INTRAOCULAR LENS IMPLANT, SECONDARY     from eye injury   LAPAROSCOPIC BILATERAL SALPINGO OOPHERECTOMY Bilateral 07/25/2020   Procedure: LAPAROSCOPIC BILATERAL SALPINGO OOPHORECTOMY;  Surgeon: Malachy Mood, MD;  Location: [facility] ORS;  Service: Gynecology;  Laterality: Bilateral;  LAPAROSCOPY  2003   LAPAROSCOPY N/A 05/21/2020   Procedure: LAPAROSCOPY DIAGNOSTIC;  Surgeon: Malachy Mood, MD;  Location: [facility] ORS;  Service: Gynecology;  Laterality: N/A;   SACROILIAC JOINT INJECTION      SOCIAL HISTORY: Social History   Socioeconomic History   Marital status: Single    Spouse name: Not on file   Number of children: Not on file   Years of education: Not on file   Highest education level: Not on file  Occupational History   Not on file  Tobacco Use   Smoking status: Former    Packs/day: 0.25    Types: Cigarettes    Quit date: 12/19/2020    Years since quitting: 1.4   Smokeless tobacco: Never  Vaping Use   Vaping Use: Some days   Last attempt to quit: 05/14/2018   Substances: Nicotine, THC, Flavoring   Substance and Sexual Activity   Alcohol use: Not Currently    Comment: social   Drug use: Yes    Types: Marijuana    Comment: occ   Sexual activity: Yes    Birth control/protection: Surgical  Other Topics Concern   Not on file  Social History Narrative   Not on file   Social Determinants of Health   Financial Resource Strain: Not on file  Food Insecurity: Not on file  Transportation Needs: Not on file  Physical Activity: Not on file  Stress: Not on file  Social Connections: Not on file  Intimate Partner Violence: Not on file    FAMILY HISTORY: Family History  Problem Relation Age of Onset   Hypertension Father    Heart disease Father    Narcolepsy Father    Obesity Father    Hypertension Brother    Obesity Maternal Aunt    Ovarian cancer Maternal Aunt 80   Obesity Paternal Aunt    Heart disease Maternal Grandfather    Ovarian cancer Paternal Grandmother        34s   Heart disease Paternal Grandfather    Uterine cancer Other     ALLERGIES:  is allergic to lamotrigine and viibryd [vilazodone hcl].  MEDICATIONS:  Current Outpatient Medications  Medication Sig Dispense Refill   acetaminophen (TYLENOL) 325 MG tablet Take 2 tablets (650 mg total) by mouth every 6 (six) hours as needed for mild pain.     Albuterol Sulfate (PROAIR RESPICLICK) 123XX123 (90 Base) MCG/ACT AEPB Inhale 2 puffs into the lungs every 6 (six) hours as needed (shortness of breath / wheezing).     apixaban (ELIQUIS) 5 MG TABS tablet Take 1 tablet (5 mg total) by mouth 2 (two) times daily. 60 tablet 2   Cholecalciferol (D3-1000) 25 MCG (1000 UT) capsule Take 1,000 Units by mouth daily.     folic acid (FOLVITE) 1 MG tablet Take 1 mg by mouth daily.     gabapentin (NEURONTIN) 300 MG capsule Take 300 mg by mouth 2 (two) times daily.     hydroxychloroquine (PLAQUENIL) 200 MG tablet Take 200 mg by mouth 2 (two) times daily.     methotrexate (RHEUMATREX) 2.5 MG tablet Take 12.5 mg by mouth every Wednesday.      ondansetron (ZOFRAN-ODT) 4 MG disintegrating tablet Take 4 mg by mouth every 6 (six) hours as needed for nausea or vomiting.     propranolol (INDERAL) 20 MG tablet Take 20 mg by mouth 2 (two) times daily as needed (anxiety).     vitamin B-12 (CYANOCOBALAMIN) 1000 MCG tablet Take 1,000 mcg by mouth  daily.     HYDROcodone-acetaminophen (NORCO/VICODIN) 5-325 MG tablet Take 1 tablet by mouth every 4 (four) hours as needed for moderate pain. (Patient not taking: Reported on 03/17/2022) 15 tablet 0   No current facility-administered medications for this visit.     PHYSICAL EXAMINATION: ECOG PERFORMANCE STATUS: 1 - Symptomatic but completely ambulatory Vitals:   06/04/22 1040  BP: 124/63  Pulse: 62  Temp: (!) 97.3 F (36.3 C)   Filed Weights   06/04/22 1040  Weight: 295 lb (133.8 kg)    Physical Exam Constitutional:      General: She is not in acute distress.    Appearance: She is obese.  HENT:     Head: Normocephalic and atraumatic.  Eyes:     General: No scleral icterus. Cardiovascular:     Rate and Rhythm: Normal rate and regular rhythm.     Heart sounds: Normal heart sounds.  Pulmonary:     Effort: Pulmonary effort is normal. No respiratory distress.     Breath sounds: No wheezing.  Abdominal:     General: Bowel sounds are normal. There is no distension.     Palpations: Abdomen is soft.  Musculoskeletal:        General: Swelling present. No deformity. Normal range of motion.     Cervical back: Normal range of motion and neck supple.     Comments: Bilateral lower extremity swelling, left worse than right  Skin:    General: Skin is warm and dry.     Findings: No erythema or rash.  Neurological:     Mental Status: She is alert and oriented to person, place, and time. Mental status is at baseline.     Cranial Nerves: No cranial nerve deficit.     Coordination: Coordination normal.  Psychiatric:        Mood and Affect: Mood normal.     LABORATORY DATA:  I have  reviewed the data as listed Lab Results  Component Value Date   WBC 10.5 06/04/2022   HGB 14.1 06/04/2022   HCT 43.7 06/04/2022   MCV 84.0 06/04/2022   PLT 308 06/04/2022   Recent Labs    01/26/22 0921 05/03/22 1646 06/04/22 1023  NA 140 139 137  K 4.2 3.1* 3.8  CL 105 108 105  CO2 28 26 24   GLUCOSE 105* 149* 109*  BUN 13 9 16   CREATININE 0.63 0.73 0.78  CALCIUM 9.0 8.6* 8.8*  GFRNONAA >60 >60 >60  PROT 6.7 6.3* 7.1  ALBUMIN 3.2* 3.3* 3.6  AST 20 20 18   ALT 27 20 19   ALKPHOS 57 72 75  BILITOT 0.4 0.5 0.4  BILIDIR <0.1  --   --   IBILI NOT CALCULATED  --   --     Iron/TIBC/Ferritin/ %Sat No results found for: "IRON", "TIBC", "FERRITIN", "IRONPCTSAT"    RADIOGRAPHIC STUDIES: I have personally reviewed the radiological images as listed and agreed with the findings in the report. Venous Img Lower Unilateral Left  Result Date: 06/04/2022 CLINICAL DATA:  Left lower extremity pain. EXAM: Left LOWER EXTREMITY VENOUS DOPPLER ULTRASOUND TECHNIQUE: Gray-scale sonography with graded compression, as well as color Doppler and duplex ultrasound were performed to evaluate the lower extremity deep venous systems from the level of the common femoral vein and including the common femoral, femoral, profunda femoral, popliteal and calf veins including the posterior tibial, peroneal and gastrocnemius veins when visible. The superficial great saphenous vein was also interrogated. Spectral Doppler was utilized to evaluate flow  at rest and with distal augmentation maneuvers in the common femoral, femoral and popliteal veins. COMPARISON:  May 03, 2022.  January 19, 2022. FINDINGS: Contralateral Common Femoral Vein: Respiratory phasicity is normal and symmetric with the symptomatic side. No evidence of thrombus. Normal compressibility. Common Femoral Vein: No evidence of thrombus. Normal compressibility, respiratory phasicity and response to augmentation. Saphenofemoral Junction: No evidence of  thrombus. Normal compressibility and flow on color Doppler imaging. Profunda Femoral Vein: No evidence of thrombus. Normal compressibility and flow on color Doppler imaging. Femoral Vein: No evidence of thrombus. Normal compressibility, respiratory phasicity and response to augmentation. Popliteal Vein: There is noted nonocclusive mural thrombus of the proximal left popliteal vein which was present on prior exam and is most consistent with recanalized chronic deep venous thrombosis. Calf Veins: No evidence of thrombus. Normal compressibility and flow on color Doppler imaging. Superficial Great Saphenous Vein: No evidence of thrombus. Normal compressibility. Venous Reflux:  None. Other Findings:  None. 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 No evidence of acute deep venous thrombosis is noted. Electronically Signed   By: Marijo Conception M.D.   On: 06/04/2022 13:44

## 2022-06-05 ENCOUNTER — Telehealth: Payer: Self-pay

## 2022-06-05 DIAGNOSIS — I82412 Acute embolism and thrombosis of left femoral vein: Secondary | ICD-10-CM

## 2022-06-29 ENCOUNTER — Ambulatory Visit: Payer: Medicaid Other | Admitting: Internal Medicine

## 2022-06-29 DIAGNOSIS — Z91199 Patient's noncompliance with other medical treatment and regimen due to unspecified reason: Secondary | ICD-10-CM

## 2022-06-29 NOTE — Progress Notes (Unsigned)
Pt did not show for scheduled appointment.  

## 2022-09-06 ENCOUNTER — Emergency Department
Admission: EM | Admit: 2022-09-06 | Discharge: 2022-09-06 | Disposition: A | Discharge status: Home / Self Care | Payer: BC Managed Care – PPO | Attending: Emergency Medicine | Admitting: Emergency Medicine

## 2022-09-06 DIAGNOSIS — X58XXXA Exposure to other specified factors, initial encounter: Secondary | ICD-10-CM | POA: Insufficient documentation

## 2022-09-06 DIAGNOSIS — S0502XA Injury of conjunctiva and corneal abrasion without foreign body, left eye, initial encounter: Secondary | ICD-10-CM | POA: Insufficient documentation

## 2022-09-06 DIAGNOSIS — H5712 Ocular pain, left eye: Secondary | ICD-10-CM

## 2022-09-06 MED ORDER — MOXIFLOXACIN HCL 0.5 % OP SOLN: 1.0000 [drp] | Freq: Four times a day (QID) | OPHTHALMIC | 0 refills | Status: AC

## 2022-09-06 MED ORDER — HYDROCODONE-ACETAMINOPHEN 5-325 MG PO TABS
1.0000 | ORAL_TABLET | Freq: Once | ORAL | Status: AC
  Administered 2022-09-06: 1 via ORAL
  Filled 2022-09-06: qty 1

## 2022-09-06 MED ORDER — FLUORESCEIN SODIUM 1 MG OP STRP
1.0000 | ORAL_STRIP | Freq: Once | OPHTHALMIC | Status: AC
  Administered 2022-09-06: 1 via OPHTHALMIC
  Filled 2022-09-06: qty 1

## 2022-09-06 MED ORDER — TETRACAINE HCL 0.5 % OP SOLN
1.0000 [drp] | Freq: Once | OPHTHALMIC | Status: AC
  Administered 2022-09-06: 1 [drp] via OPHTHALMIC
  Filled 2022-09-06: qty 4

## 2022-09-06 MED ORDER — HYDROCODONE-ACETAMINOPHEN 5-325 MG PO TABS: 1.0000 | ORAL_TABLET | Freq: Four times a day (QID) | ORAL | 0 refills | Status: AC | PRN

## 2022-09-06 NOTE — Discharge Instructions (Signed)
Please be sure to follow up with ophthalmology tomorrow.

## 2022-09-06 NOTE — ED Provider Notes (Signed)
[facility] Provider Note    Event Date/Time   First MD Initiated Contact with Patient 09/06/22 1715     (approximate)   History   Eye Pain   HPI  Gina Farmer is a 36 y.o. female who presents to the emergency department today because of concerns for left eye pain and pressure and vision change.  Patient has complicated left eye history.  She states when she was to she had her lens removed after trauma.  She then had an artificial lens placed when she was 17.  About a year ago she had similar symptoms and saw her eye doctor who told her she had glaucoma.  She was able to treat that episode with eyedrops.  Starting 2 days ago she started having the same pressure and pain as she did a year ago.  She tried treating it with antibiotic eyedrops that she have to have at her house without any relief.  She does state that her vision is worse than typical in her left eye.      Physical Exam   Triage Vital Signs: ED Triage Vitals  Enc Vitals Group     BP 09/06/22 1655 (!) 138/99     Pulse Rate 09/06/22 1655 84     Resp 09/06/22 1655 14     Temp 09/06/22 1655 98.2 F (36.8 C)     Temp Source 09/06/22 1655 Oral     SpO2 09/06/22 1655 95 %     Weight 09/06/22 1657 295 lb (133.8 kg)     Height 09/06/22 1657 5\' 1"  (1.549 m)     Head Circumference --      Peak Flow --      Pain Score 09/06/22 1656 10     Pain Loc --      Pain Edu? --      Excl. in [place]? --     Most recent vital signs: Vitals:   09/06/22 1655  BP: (!) 138/99  Pulse: 84  Resp: 14  Temp: 98.2 F (36.8 C)  SpO2: 95%    General: Awake, alert oriented. Eyes:  Left eye with red conjunctiva. On staining small area of uptake in the cornea. Pressure 20 mmhg.  Resp:  Normal effort.  Abd:  No distention.     ED Results / Procedures / Treatments   Labs (all labs ordered are listed, but only abnormal results are displayed) Labs Reviewed - No data to  display   EKG  None   RADIOLOGY None   PROCEDURES:  Critical Care performed: No  Procedures   MEDICATIONS ORDERED IN ED: Medications  fluorescein ophthalmic strip 1 strip (has no administration in time range)  tetracaine (PONTOCAINE) 0.5 % ophthalmic solution 1 drop (has no administration in time range)     IMPRESSION / MDM / ASSESSMENT AND PLAN / ED COURSE  I reviewed the triage vital signs and the nursing notes.                              Differential diagnosis includes, but is not limited to, increased intraocular pressure, corneal abrasion, conjunctivitis.   Patient's presentation is most consistent with acute presentation with potential threat to life or bodily function.  Patient presented to the emergency department today because of concern for left eye pain and pressure. Exam with pressure of 69mmhg in the left eye. Does have small area of corneal abrasion. Discussed with  Dr. Druscilla Brownie with ophthalmology who recommended antibiotic eye drops and follow up in clinic tomorrow. Discussed plan with patient. Will also give prescription for pain medication.   FINAL CLINICAL IMPRESSION(S) / ED DIAGNOSES   Final diagnoses:  Left eye pain  Abrasion of left cornea, initial encounter      Note:  This document was prepared using Dragon voice recognition software and may include unintentional dictation errors.    Phineas Semen, MD 09/06/22 [phone]

## 2022-09-06 NOTE — ED Triage Notes (Signed)
Pt presents to ED from home C/O L eye pain since Friday. Pt reports hx intraocular lens implant several years ago and had the same pain when her pressure increased in her eye before.

## 2022-10-12 ENCOUNTER — Encounter (INDEPENDENT_AMBULATORY_CARE_PROVIDER_SITE_OTHER): Payer: Self-pay

## 2022-11-13 ENCOUNTER — Other Ambulatory Visit: Payer: Self-pay

## 2022-11-13 ENCOUNTER — Emergency Department: Payer: Medicaid Other

## 2022-11-13 DIAGNOSIS — R112 Nausea with vomiting, unspecified: Secondary | ICD-10-CM | POA: Diagnosis not present

## 2022-11-13 DIAGNOSIS — Z7901 Long term (current) use of anticoagulants: Secondary | ICD-10-CM | POA: Insufficient documentation

## 2022-11-13 DIAGNOSIS — D72829 Elevated white blood cell count, unspecified: Secondary | ICD-10-CM | POA: Insufficient documentation

## 2022-11-13 DIAGNOSIS — R1011 Right upper quadrant pain: Secondary | ICD-10-CM | POA: Insufficient documentation

## 2022-11-13 DIAGNOSIS — R8289 Other abnormal findings on cytological and histological examination of urine: Secondary | ICD-10-CM | POA: Diagnosis not present

## 2022-11-13 LAB — CBC WITH DIFFERENTIAL/PLATELET
Abs Immature Granulocytes: 0.02 10*3/uL (ref 0.00–0.07)
Basophils Absolute: 0.1 10*3/uL (ref 0.0–0.1)
Basophils Relative: 1 %
Eosinophils Absolute: 0.4 10*3/uL (ref 0.0–0.5)
Eosinophils Relative: 4 %
HCT: 43.9 % (ref 36.0–46.0)
Hemoglobin: 14 g/dL (ref 12.0–15.0)
Immature Granulocytes: 0 %
Lymphocytes Relative: 31 %
Lymphs Abs: 3.3 10*3/uL (ref 0.7–4.0)
MCH: 27.5 pg (ref 26.0–34.0)
MCHC: 31.9 g/dL (ref 30.0–36.0)
MCV: 86.1 fL (ref 80.0–100.0)
Monocytes Absolute: 0.6 10*3/uL (ref 0.1–1.0)
Monocytes Relative: 5 %
Neutro Abs: 6.3 10*3/uL (ref 1.7–7.7)
Neutrophils Relative %: 59 %
Platelets: 332 10*3/uL (ref 150–400)
RBC: 5.1 MIL/uL (ref 3.87–5.11)
RDW: 13.4 % (ref 11.5–15.5)
WBC: 10.6 10*3/uL — ABNORMAL HIGH (ref 4.0–10.5)
nRBC: 0 % (ref 0.0–0.2)

## 2022-11-13 LAB — URINALYSIS, COMPLETE (UACMP) WITH MICROSCOPIC
Bilirubin Urine: NEGATIVE
Glucose, UA: NEGATIVE mg/dL
Ketones, ur: NEGATIVE mg/dL
Leukocytes,Ua: NEGATIVE
Nitrite: POSITIVE — AB
Protein, ur: NEGATIVE mg/dL
Specific Gravity, Urine: 1.018 (ref 1.005–1.030)
pH: 6 (ref 5.0–8.0)

## 2022-11-13 LAB — COMPREHENSIVE METABOLIC PANEL
ALT: 26 U/L (ref 0–44)
AST: 24 U/L (ref 15–41)
Albumin: 3.8 g/dL (ref 3.5–5.0)
Alkaline Phosphatase: 71 U/L (ref 38–126)
Anion gap: 7 (ref 5–15)
BUN: 11 mg/dL (ref 6–20)
CO2: 24 mmol/L (ref 22–32)
Calcium: 8.8 mg/dL — ABNORMAL LOW (ref 8.9–10.3)
Chloride: 108 mmol/L (ref 98–111)
Creatinine, Ser: 0.57 mg/dL (ref 0.44–1.00)
GFR, Estimated: >60 mL/min (ref >60)
Glucose, Bld: 96 mg/dL (ref 70–99)
Potassium: 4 mmol/L (ref 3.5–5.1)
Sodium: 139 mmol/L (ref 135–145)
Total Bilirubin: 0.7 mg/dL (ref 0.3–1.2)
Total Protein: 7.1 g/dL (ref 6.5–8.1)

## 2022-11-13 LAB — LIPASE, BLOOD: Lipase: 31 U/L (ref 11–51)

## 2022-11-13 NOTE — ED Triage Notes (Signed)
Pt c/o RUQ abd pain that started approx 21 hours ago and sts it was after she ate. Pt sts she has vomited approx 5 times. Pt sts she has had a poor appetite and nauseas.

## 2022-11-14 ENCOUNTER — Emergency Department: Payer: Medicaid Other

## 2022-11-14 ENCOUNTER — Emergency Department
Admission: EM | Admit: 2022-11-14 | Discharge: 2022-11-14 | Disposition: A | Discharge status: Home / Self Care | Payer: Medicaid Other | Attending: Emergency Medicine | Admitting: Emergency Medicine

## 2022-11-14 DIAGNOSIS — R1010 Upper abdominal pain, unspecified: Secondary | ICD-10-CM

## 2022-11-14 MED ORDER — FAMOTIDINE 20 MG PO TABS: 20.0000 mg | ORAL_TABLET | Freq: Two times a day (BID) | ORAL | 0 refills | Status: DC

## 2022-11-14 MED ORDER — IOHEXOL 300 MG/ML  SOLN
100.0000 mL | Freq: Once | INTRAMUSCULAR | Status: AC | PRN
  Administered 2022-11-14: 100 mL via INTRAVENOUS

## 2022-11-14 MED ORDER — TRAMADOL HCL 50 MG PO TABS: 50.0000 mg | ORAL_TABLET | Freq: Four times a day (QID) | ORAL | 0 refills | Status: AC | PRN

## 2022-11-14 MED ORDER — METOCLOPRAMIDE HCL 10 MG PO TABS: 10.0000 mg | ORAL_TABLET | Freq: Three times a day (TID) | ORAL | 0 refills | Status: DC | PRN

## 2022-11-14 MED ORDER — OXYCODONE-ACETAMINOPHEN 5-325 MG PO TABS
1.0000 | ORAL_TABLET | Freq: Once | ORAL | Status: AC
  Administered 2022-11-14: 1 via ORAL
  Filled 2022-11-14: qty 1

## 2022-11-14 NOTE — Discharge Instructions (Signed)
We suspect that your abdominal pain is likely due to either gastritis (inflammation in the stomach), gastroenteritis (a viral infection) or IBS.  Take the Pepcid twice daily.  Use the Reglan if needed for nausea.  Use the tramadol only if needed for pain that is not relieved by the other medications.  Return to the ER for new, worsening, or persistent severe abdominal pain, vomiting, fever, weakness, chest pain or difficulty breathing, or any other new or worsening symptoms that concern you.

## 2022-11-14 NOTE — ED Provider Notes (Signed)
[facility] Provider Note    Event Date/Time   First MD Initiated Contact with Patient 11/14/22 0202     (approximate)   History   Abdominal Pain   HPI  Gina Farmer is a 36 y.o. 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  It radiates up into her chest causing a heartburn type sensation.  She reports nausea with several episodes of vomiting.  She has not had any diarrhea or change in her bowel movements today but has had some bowel movements over the last few days that she thought were not color.  She denies any blood.  She has no fever or chills.  The patient denies any difficulty breathing or any ongoing chest pain.  She has no new leg swelling.  She states that she has had this right upper quadrant pain more mildly a few times in the past.  She has had an appendectomy but denies other abdominal surgeries.  I reviewed the past medical records.  The patient was admitted in February with acute DVT and PE and per the hospitalist discharge summary was started on Eliquis at that time.  The patient reports that she has not been taking it for about the last 2 months.   Physical Exam   Triage Vital Signs: ED Triage Vitals  Enc Vitals Group     BP 11/13/22 2111 130/78     Pulse Rate 11/13/22 2111 80     Resp 11/13/22 2111 18     Temp 11/13/22 2111 98.6 F (37 C)     Temp Source 11/13/22 2111 Oral     SpO2 11/13/22 2111 94 %     Weight 11/13/22 2114 (!) 306 lb (138.8 kg)     Height 11/13/22 2114 5\' 1"  (1.549 m)     Head Circumference --      Peak Flow --      Pain Score --      Pain Loc --      Pain Edu? --      Excl. in GC? --     Most recent vital signs: Vitals:   11/14/22 0232 11/14/22 0300  BP: 121/79 (!) 129/59  Pulse: 66 74  Resp: 15   Temp: 97.9 F (36.6  C)   SpO2: 100% 97%     General: Awake, no distress.  CV:  Good peripheral perfusion.  Resp:  Normal effort.  Abd:  Right upper quadrant and right mid abdominal tenderness.  No distention.  Other:  No jaundice or scleral icterus.   ED Results / Procedures / Treatments   Labs (all labs ordered are listed, but only abnormal results are displayed) Labs Reviewed  CBC WITH DIFFERENTIAL/PLATELET - Abnormal; Notable for the following components:      Result Value   WBC 10.6 (*)    All other components within normal limits  COMPREHENSIVE METABOLIC PANEL - Abnormal; Notable for the following components:   Calcium 8.8 (*)    All other components within normal limits  URINALYSIS, COMPLETE (UACMP) WITH MICROSCOPIC - Abnormal; Notable for the following components:   Color, Urine YELLOW (*)    APPearance CLOUDY (*)    Hgb urine dipstick MODERATE (*)    Nitrite POSITIVE (*)    Bacteria, UA RARE (*)    All other components within  normal limits  LIPASE, BLOOD     EKG  ED ECG REPORT I, Dionne Bucy, the attending physician, personally viewed and interpreted this ECG.  Date: 11/14/2022 EKG Time: 2116 Rate: 77 Rhythm: normal sinus rhythm QRS Axis: normal Intervals: normal ST/T Wave abnormalities: normal Narrative Interpretation: no evidence of acute ischemia    RADIOLOGY  Chest x-ray: I independently viewed and interpreted the images; there is no focal consolidation or edema  US abdomen RUQ: No gallstones or other acute abnormalities  CT abdomen/pelvis: No acute abnormality  PROCEDURES:  Critical Care performed: No  Procedures   MEDICATIONS ORDERED IN ED: Medications  oxyCODONE-acetaminophen (PERCOCET/ROXICET) 5-325 MG per tablet 1 tablet (1 tablet Oral Given 11/14/22 0233)  iohexol (OMNIPAQUE) 300 MG/ML solution 100 mL (100 mLs Intravenous Contrast Given 11/14/22 0431)     IMPRESSION / MDM / ASSESSMENT AND PLAN / ED COURSE  I reviewed the triage vital signs  and the nursing notes.  36 year old female with PMH as noted above presents with right upper quadrant abdominal pain associated with nausea and vomiting for approximate last 24 hours.  She has had some heartburn but denies any ongoing chest pain or difficulty breathing.  Physical exam reveals normal vital signs and tenderness in the right upper quadrant.  Differential diagnosis includes, but is not limited to, biliary colic, acute cholecystitis, pancreatitis, other hepatobiliary etiology, gastritis, gastroenteritis, PUD, less likely colitis or diverticulitis.  Although the patient has not been compliant with her Eliquis, there is no evidence of acute PE based on the location of the pain as well as the lack of tachycardia or hypoxia.  Initial lab work-up is unremarkable.  There is mild leukocytosis, lipase is normal, LFTs are normal.  The urinalysis does show positive nitrites but no significant WBCs.  The patient denies urinary symptoms.  I will obtain a right upper quadrant ultrasound for further evaluation.  Patient's presentation is most consistent with acute complicated illness / injury requiring diagnostic workup.  The patient is on the cardiac monitor to evaluate for evidence of arrhythmia and/or significant heart rate changes.  ----------------------------------------- 5:44 AM on 11/14/2022 -----------------------------------------  Ultrasound is negative for cholelithiasis or other acute findings so I obtained a CT which is also negative for acute findings.  Overall I suspect gastritis, gastroenteritis, or IBS which the patient has a prior history of.  Although the urinalysis is nitrite positive, given that there are no WBCs or leukocyte Estrace and the patient has no urinary symptoms,  At this time the patient feels substantially better and is will for discharge home.  I counseled her on the results of the work-up and answered all of her questions.  I will prescribe Pepcid, Reglan, and a  small quantity of tramadol for breakthrough pain.  I gave her strict return precautions and she expressed understanding.    FINAL CLINICAL IMPRESSION(S) / ED DIAGNOSES   Final diagnoses:  Upper abdominal pain     Rx / DC Orders   ED Discharge Orders          Ordered    famotidine (PEPCID) 20 MG tablet  2 times daily        11/14/22 0544    metoCLOPramide (REGLAN) 10 MG tablet  Every 8 hours PRN        11/14/22 0544    traMADol (ULTRAM) 50 MG tablet  Every 6 hours PRN        11/14/22 0544             Note:  This document was prepared using Dragon voice recognition software and may include unintentional dictation errors.    Dionne Bucy, MD 11/14/22 [phone]

## 2022-11-30 ENCOUNTER — Inpatient Hospital Stay: Payer: Medicaid Other | Attending: Oncology

## 2022-11-30 ENCOUNTER — Inpatient Hospital Stay: Payer: Medicaid Other | Admitting: Oncology

## 2023-05-16 IMAGING — CT CT RENAL STONE PROTOCOL
2 of 5 series · 15 of 46 positions shown, 17 images · non-contrast
Comparison: 05/29/2010

CLINICAL DATA: Flank pain with kidney stone suspected

EXAM:
CT ABDOMEN AND PELVIS WITHOUT CONTRAST
TECHNIQUE: Multidetector CT imaging of the abdomen and pelvis was performed
following the standard protocol without IV contrast.

[Series 5: coronal · coronal · 0.90mm/px · 3 of 247 slices shown]
[im 83/247  soft-tissue]
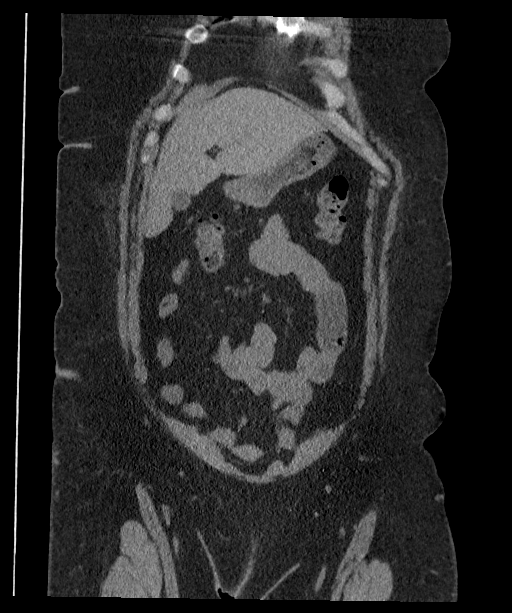
[im 110/247  soft-tissue]
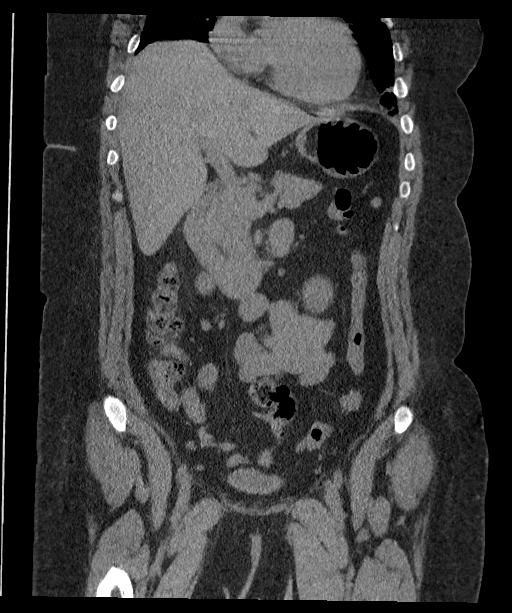
[im 137/247  soft-tissue]
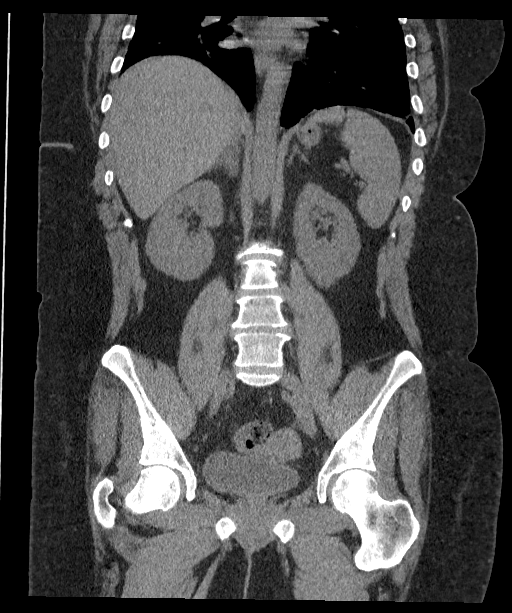

[Series 7: stone full standard recon · axial · 0.90mm/px · z∈[-526,-81]mm · 12 of 107 slices shown, 14 images]
[im 9/107  soft-tissue]
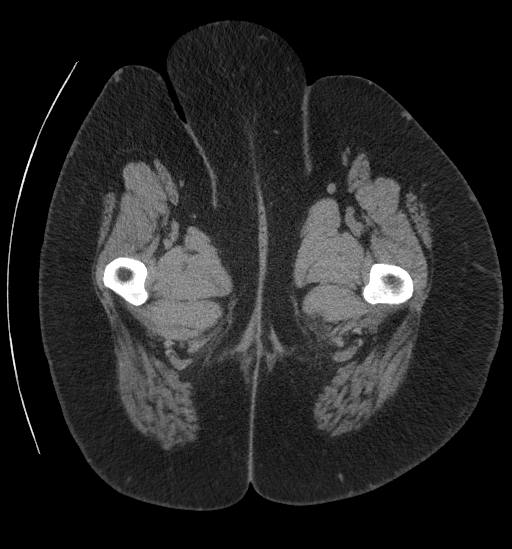
[im 9/107  bone]
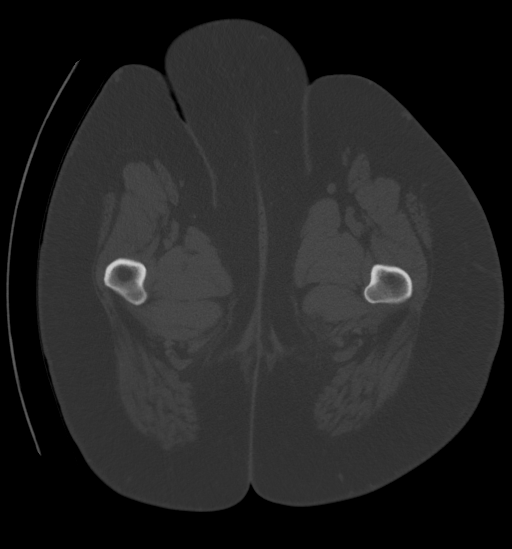
[im 17/107  soft-tissue]
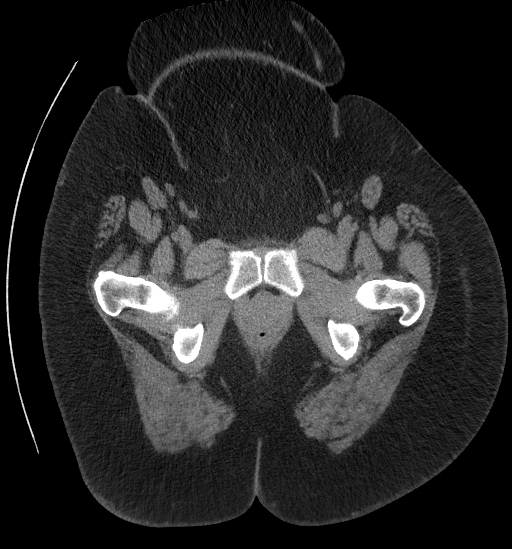
[im 25/107  soft-tissue]
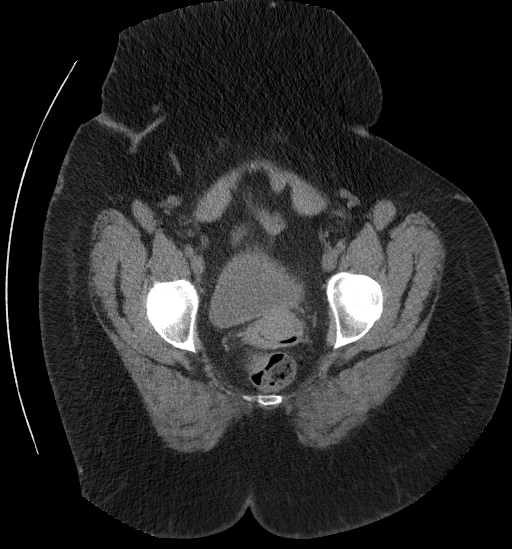
[im 33/107  soft-tissue]
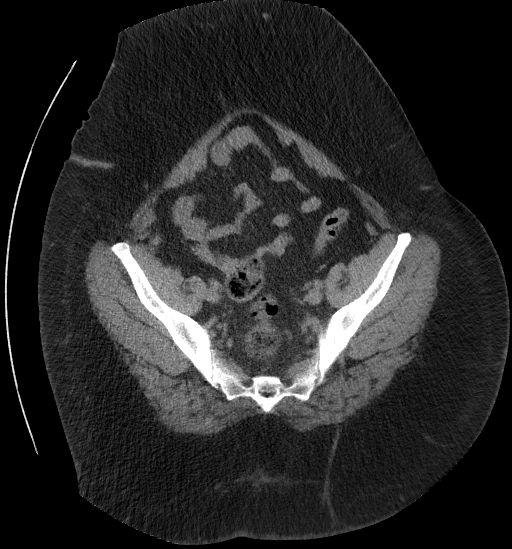
[im 41/107  soft-tissue]
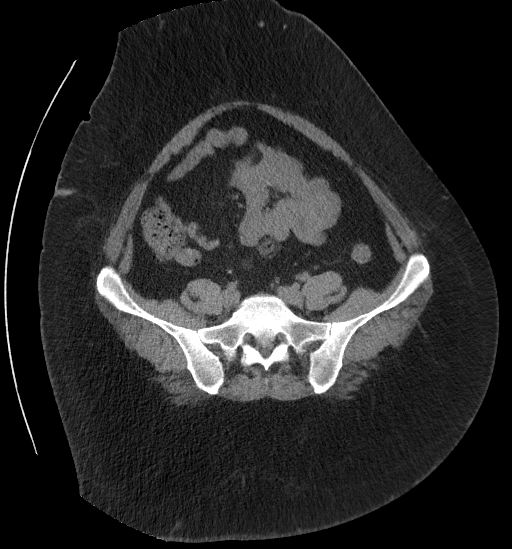
[im 49/107  soft-tissue]
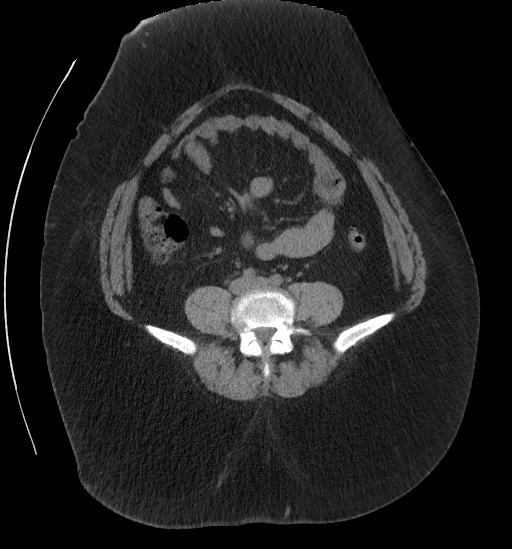
[im 58/107  soft-tissue]
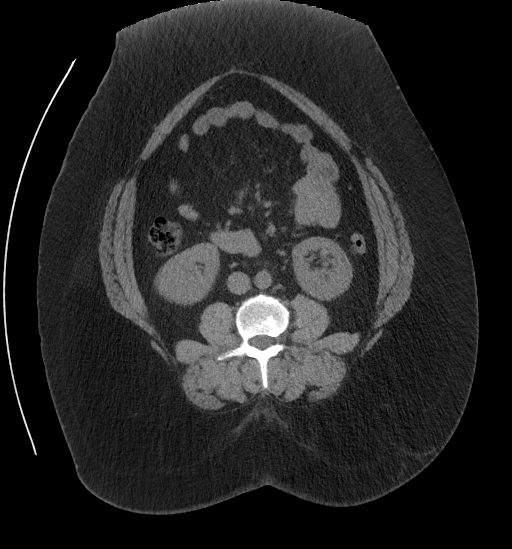
[im 66/107  soft-tissue]
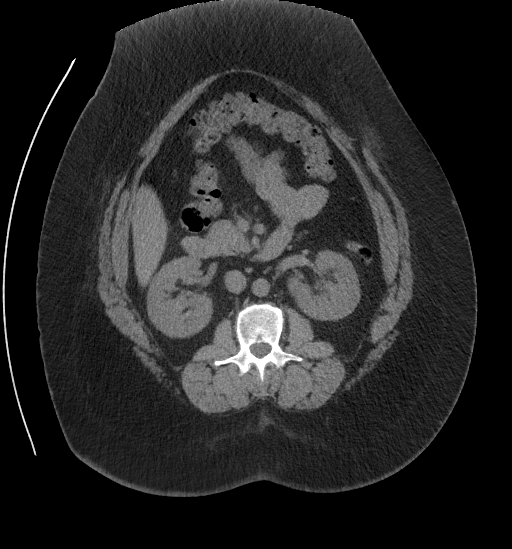
[im 74/107  soft-tissue]
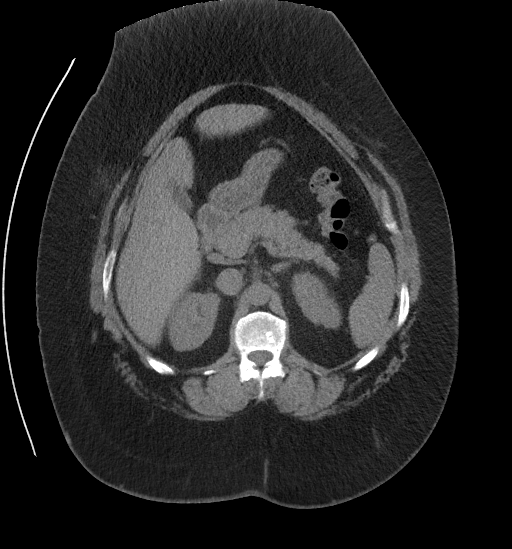
[im 74/107  bone]
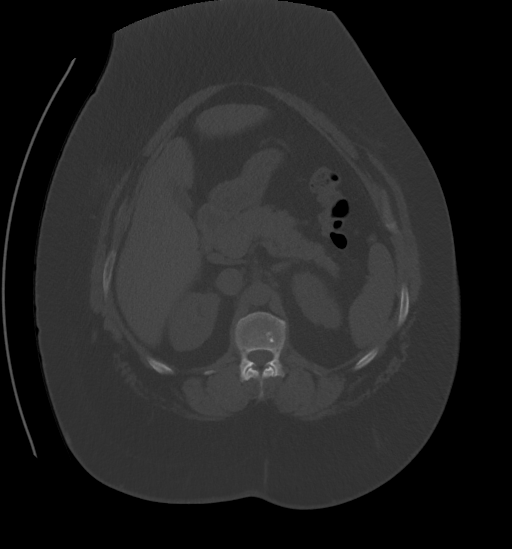
[im 82/107  soft-tissue]
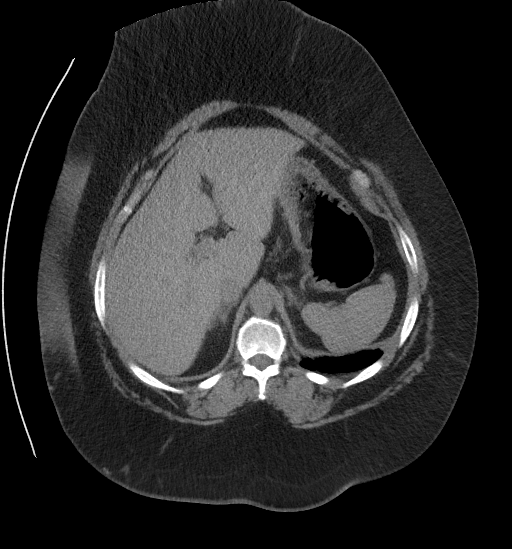
[im 90/107  soft-tissue]
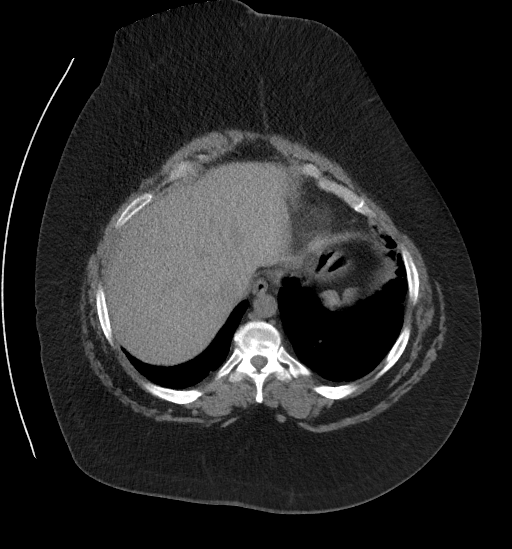
[im 98/107  soft-tissue]
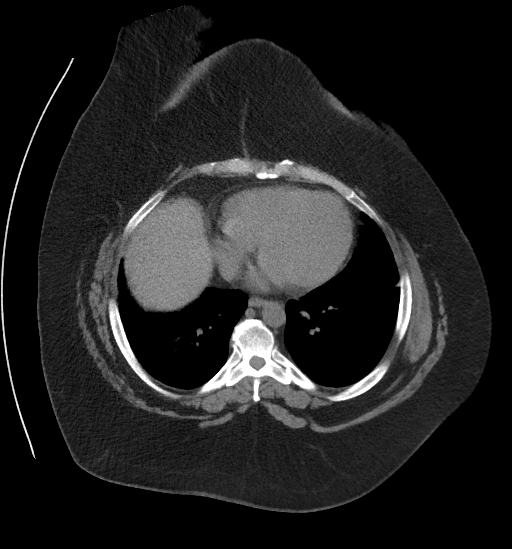

[15 of 46 positions shown; findings below may reference images not displayed]

FINDINGS: Lower chest:  No contributory findings.

Hepatobiliary: No focal liver abnormality.No evidence of biliary
obstruction or stone.

Pancreas: Unremarkable.

Spleen: Unremarkable.

Adrenals/Urinary Tract: Negative adrenals. No hydronephrosis or
stone. Unremarkable bladder.

Stomach/Bowel: No obstruction. Appendectomy. No visible inflammation

Vascular/Lymphatic: No acute vascular abnormality. No mass or
adenopathy.

Reproductive:No pathologic findings.

Other: No ascites or pneumoperitoneum.

Musculoskeletal: No acute abnormalities. Generalized disc narrowing
and endplate spurring.
IMPRESSION: No acute finding or explanation for pain.

## 2023-05-24 IMAGING — US US ABDOMEN LIMITED
1 series · 14 of 25 positions shown · non-contrast
Comparison: Noncontrast CT Abdomen and Pelvis 09/11/2021 and
earlier.

CLINICAL DATA: 35-year-old female with 1 week of abdominal pain.

EXAM:
ULTRASOUND ABDOMEN LIMITED RIGHT UPPER QUADRANT

[Series 1: us abdomen limited ruq (liver/gb) · 14 of 44 slices shown]
[im 1/44]
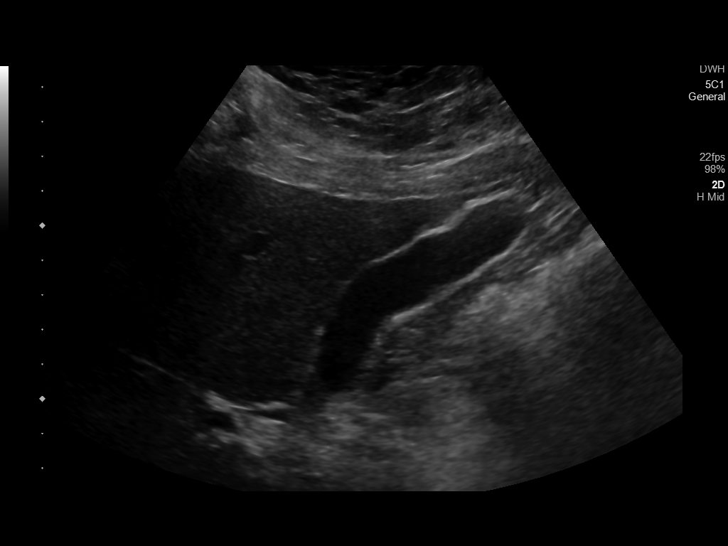
[im 4/44]
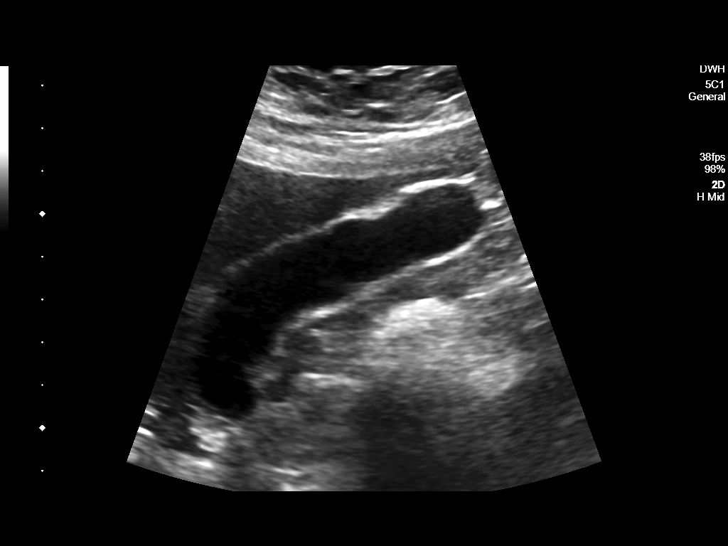
[im 8/44]
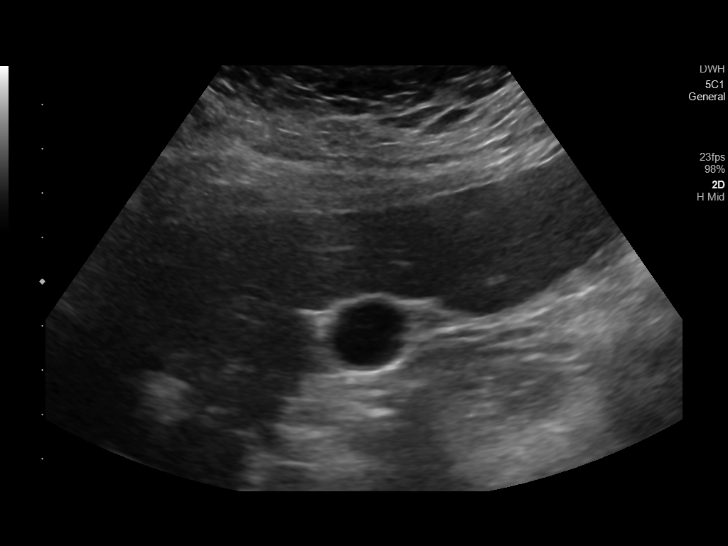
[im 11/44]
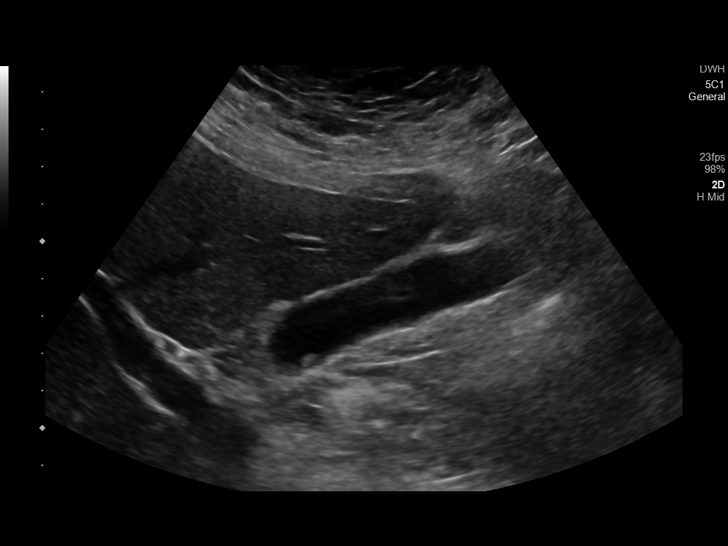
[im 15/44]
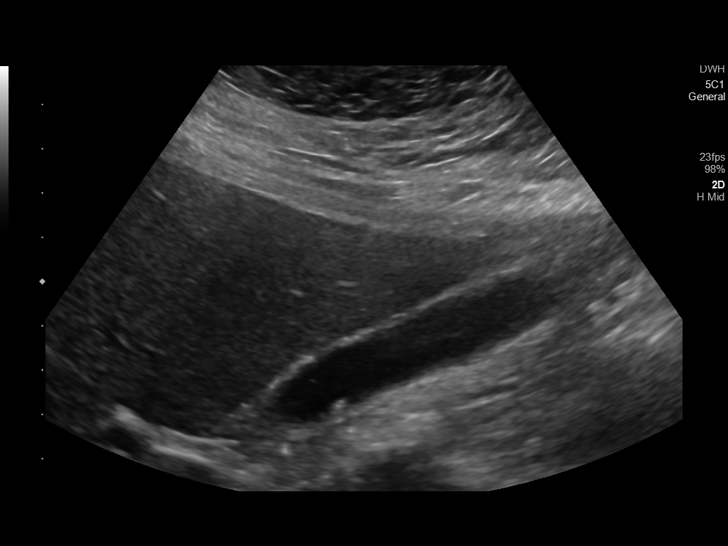
[im 17/44]
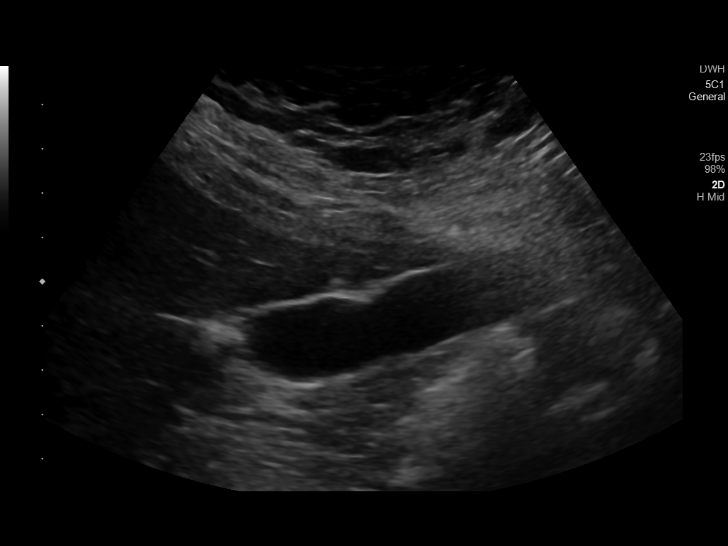
[im 20/44]
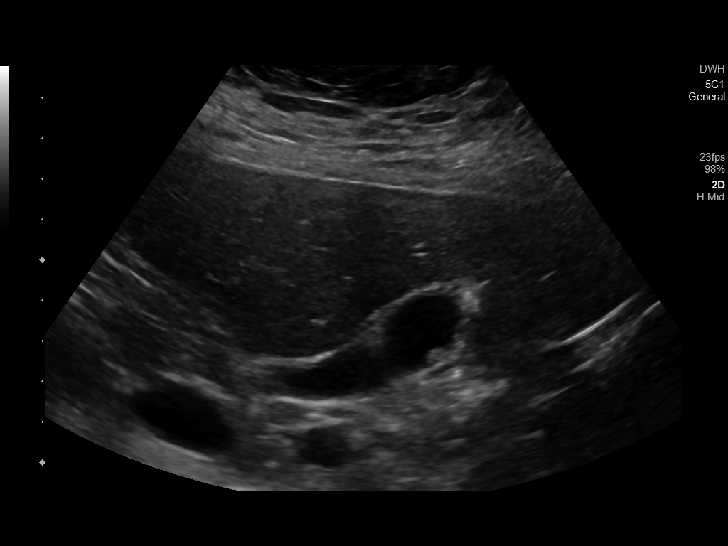
[im 24/44]
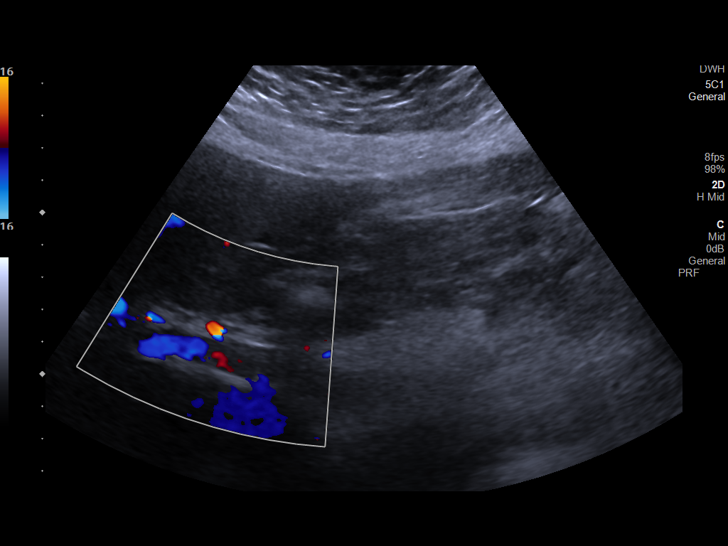
[im 27/44]
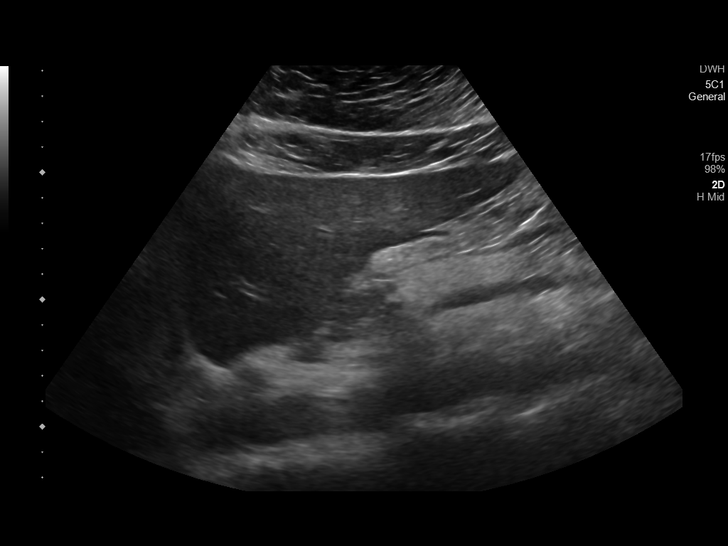
[im 29/44]
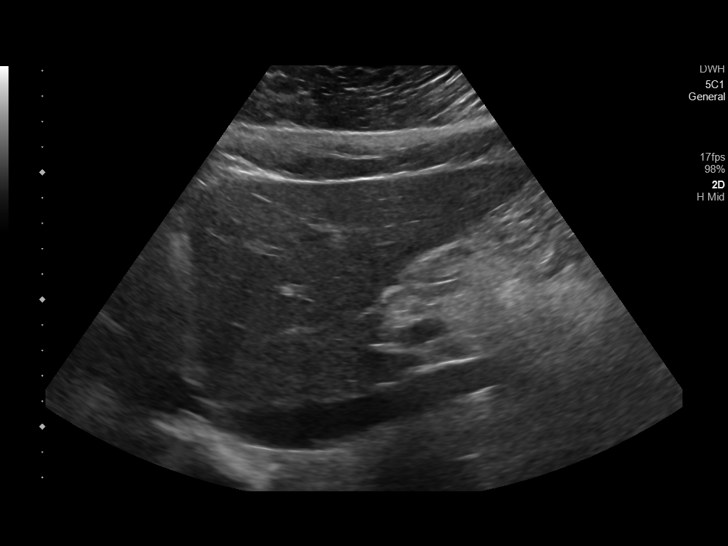
[im 33/44]
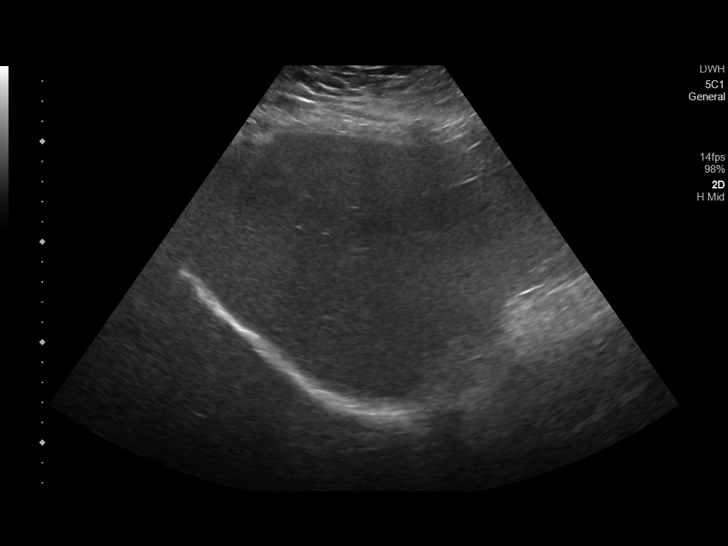
[im 36/44]
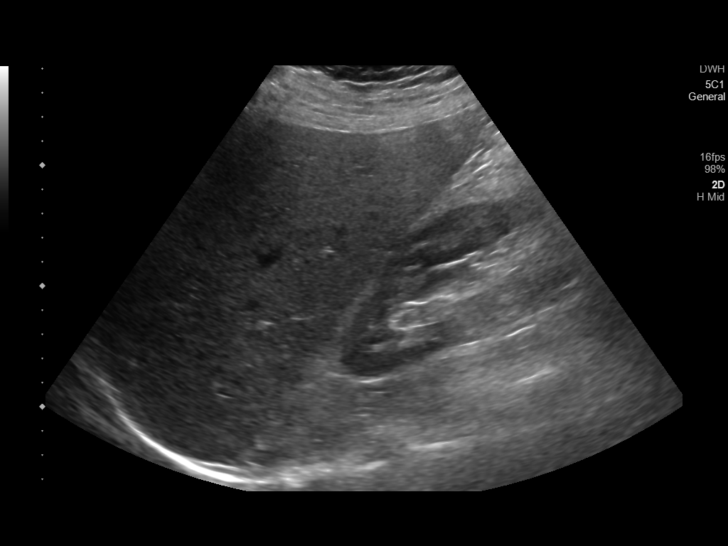
[im 40/44]
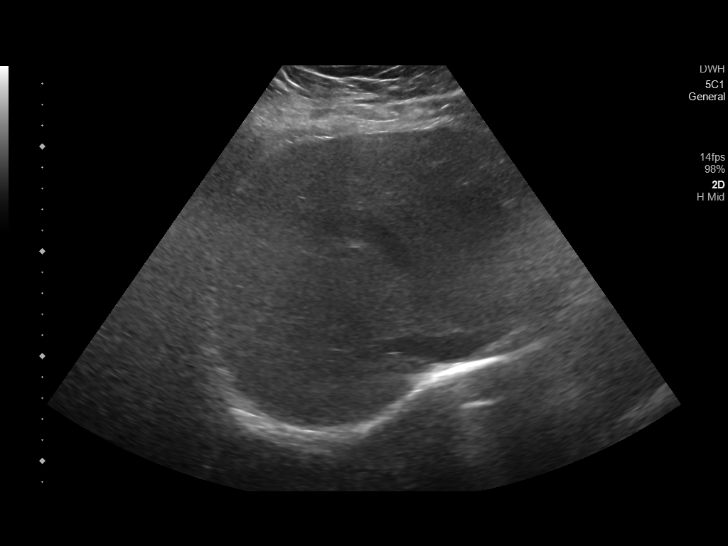
[im 44/44]
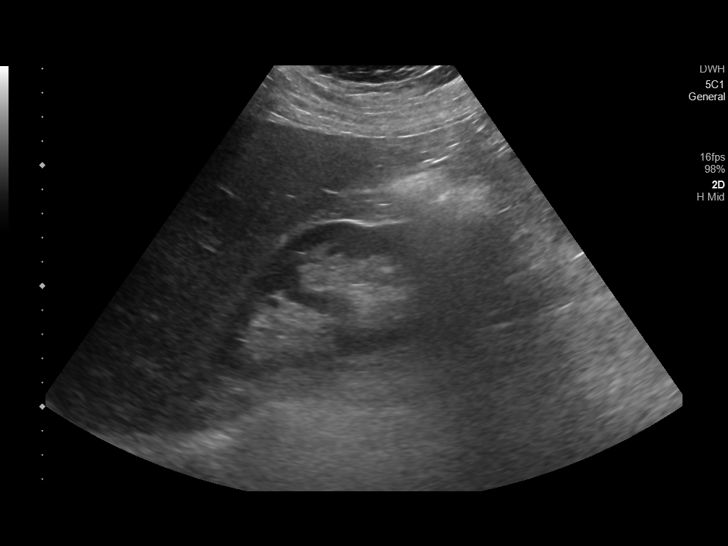

[14 of 25 positions shown; findings below may reference images not displayed]

FINDINGS: Gallbladder:

No shadowing echogenic stones identified. There is a small chronic 4
mm gallbladder polyp seen in the lower body on image 17. This is
unchanged from a 0915 ultrasound and inconsequential (ACR consensus
guidelines: White Paper of the ACR Incidental Findings Committee II
on Gallbladder and Biliary Findings. [HOSPITAL]
5492:;[DATE]). Elsewhere gallbladder wall thickness is normal.
No pericholecystic fluid. No sonographic Murphy sign elicited.

Common bile duct:

Diameter: 4 mm, normal.

Liver:

No focal lesion identified. Within normal limits in parenchymal
echogenicity. Portal vein is patent on color Doppler imaging with
normal direction of blood flow towards the liver.

Other: Negative visible right kidney.
IMPRESSION: Negative right upper quadrant ultrasound.

## 2024-05-19 ENCOUNTER — Other Ambulatory Visit: Payer: Self-pay | Admitting: Obstetrics and Gynecology

## 2024-05-19 DIAGNOSIS — Z01818 Encounter for other preprocedural examination: Secondary | ICD-10-CM

## 2024-05-19 NOTE — H&P (View-Only) (Signed)
 GYN H&P   CC: postmenopausal bleeding   HPI: Gina Farmer 38 y.o. [id number] with a hx of morbid obesity, DVT and PE, bipolar, migraines w/o aura, tobacco use, diverticulosis, endometriosis s/p BSO,  prediabetes, vitamin D deficiency, and vitamin B12 deficiency who presents for follow up of postmenopausal bleeding.   History of lap BSO in 2021 for endometriosis. Reports no vaginal bleeding from time of BSO until April. A few days of pink/red spotting. End of April started bleeding more heavily and bled for about 2 weeks. Had a few days of spotting. No bleeding past few days.    Reports pain improved after BSO. Before surgery on a good day, pain was 7/10 and since then pain is still daily but lower- 3/10. Midline cramping pain. Denies dysuria, urinary incontinence, hematuria. Daily BM now.  PCP: NONE  ROS: All other systems reviewed and negative   PMHx: Past Medical History:  Diagnosis Date   Abnormal uterine bleeding    Anxiety    Arthritis    Bipolar 1 disorder (CMS/HHS-HCC)    Bipolar disorder (CMS/HHS-HCC)    Chickenpox    Clotting disorder (CMS/HHS-HCC)    Depression    Dysmenorrhea    Endometriosis    Endometriosis of uterus    Bilateral salpingo oopharectomy   Essential hypertension 05/05/2024   Migraines    Osteoporosis    Sleep apnea       PSHx: Past Surgical History:  Procedure Laterality Date   Diagnostic laparoscopy  2001   PELVIC LAPAROSCOPY  2003   adhesions   COLONOSCOPY  11/15/2014   Normal Biopsies - no repeat per Dr. Leatrice Provost   ovaries removed  07/2020   APPENDECTOMY     LENS EYE SURGERY  1989, 2004, 2005   lens removed, then lens replaced     OBHx: OB History  Gravida Para Term Preterm AB Living  2 0 0 0 2 0  SAB IAB Ectopic Molar Multiple Live Births  1 1 0   0      # Outcome Date GA Lbr Len/2nd Weight Sex Type Anes PTL Lv  2 SAB           1 IAB              GYN Hx: - LMP: Patient's last menstrual period was 03/28/2024.  - Menses: see HPI -  Menopause: surgical menopause at age 53 - Pap hx: +history of abnormals, last 01/18/20 ASCUS with neg HRHPV. Hx LEEP - STI hx:Hx chlamydia and gonorrhea - Sexual preference: Sexually active with 1 female partner - Dyspareunia or sexual concerns: yes - Contraceptive method: none - Fertility desires: satisfied parity - HPV vaccination: received - Abdominal surgeries: dx lap 2003, appendectomy 2009, dx lap 2021, lap BSO 2021 - GYN procedures: LEEP     FHx: Denies FHx of breast and cervical cancer Ovarian cancer- paternal grandmother (10s)  Uterine or ovarian cancer- maternal aunt Colon cancer- paternal great aunt or uncle   Meds: Current Outpatient Medications on File Prior to Visit  Medication Sig Dispense Refill   celecoxib (CELEBREX) 200 MG capsule Take 1 capsule (200 mg total) by mouth once daily for 30 days 30 capsule 0   methylPREDNISolone  (MEDROL  DOSEPACK) 4 mg tablet Follow package directions. 21 tablet 0   No current facility-administered medications on file prior to visit.    Supplements: Vit D, Vit B   Allergies: Allergies  Allergen Reactions   Lamictal [Lamotrigine] Swelling   Trileptal [Oxcarbazepine] Other (See Comments)  Ankle swelling   Viibryd [Vilazodone] Other (See Comments)    Ankle swelling      SocHx: Social History   Tobacco Use   Smoking status: Light Smoker    Current packs/day: 0.25    Average packs/day: 0.3 packs/day for 19.9 years (5.0 ttl pk-yrs)    Types: Cigarettes    Start date: 06/12/2004   Smokeless tobacco: Never   Tobacco comments:    1-2 cigarettes/day not every day. Vaping daily.   Vaping Use   Vaping status: Every Day  Substance Use Topics   Alcohol use: Yes    Alcohol/week: 1.0 standard drink of alcohol    Types: 1 Shots of liquor per week    Comment: socially   Drug use: No     OBJECTIVE: BP 135/84 (BP Location: Left forearm, Patient Position: Sitting)   Ht 154.9 cm (5\' 1" )   Wt (!) 139.2 kg (306 lb 12.8 oz)   LMP  03/28/2024 Comment: ongoing for the last 2 months.  BMI 57.97 kg/m    Gen: NAD HEENT: [place]/AT Heart: Regular rate Lungs: Normal work of breathing Ext: No BLE edema   Pelvic ultrasound- 05/19/24 Indication: Endovaginal imaging was necessary to evaluate the uterus and ovaries. Probe: [id number].   Uterus ======   Visualized. Size 68 mm x 36 mm x 23 mm Normal Position: anteverted Malformations: none Myometrium: appears normal Endometrium: Fluid filled; walls: 0.23 + 0.25cm=0.48cm(4.53mm). Endometrial thickness, total 6.2 mm Cervix details: Endocervical fluid noted No fibroids identified No polyps identified   Right Ovary =========   Not visualized, right oophorectomy noted   Left Ovary ========   Not visualized, left oophorectomy noted   Cul de Sac =========   Normal. No free fluid visualized   Impression =========   Endovaginal ultrasound performed today due to the indications outlined above.   The sonogram reveals a normal appearing uterus.   The endometrium appears fluid filled measuring 6.24mm   Bilateral oophorectomy   There are no unusual adnexal findings.   There is no free fluid.   Follow-up ========   Endometrial sampling  ASSESSMENT/PLAN: Gina Farmer 38 y.o. [id number] with a hx of morbid obesity, DVT and PE, bipolar, migraines w/o aura, tobacco use, diverticulosis, endometriosis s/p BSO,  prediabetes, vitamin D deficiency, and vitamin B12 deficiency who presents for follow up of postmenopausal bleeding.   #Postmenopausal bleeding - Reports bleeding for past 2 months - Differential: vaginal (atrophy), cervical (dysplasia), versus uterine (fibroids, polyps, hyperplasia, malignancy)  - Due to significant pelvic pain, patient prefers to minimize evaluation in the clinic.   - Pelvic ultrasound 05/19/24: uterus anteverted 6.8x3.6x2.3cm, endometrium fluid filled measuring 6.58mm total of 4.68mm without fluid - Case request submitted for exam under anesthesia,  pap smear, hysteroscopy, D&C, and Mirena IUD placement - Discussed planned procedure, risks, and benefits. Risks such as infection, bleeding, organ damage (including uterine perforation), anesthesia complications, and need for possible blood products were discussed; patient will accept products in case of an emergency. We discussed the risks of a blood transfusion, such as a 1 in 1.2-1.4 million chance of contracting HIV, Hep C. Discussed possibility of needing to perform a laparoscopic procedure in the case of uterine perforation.   - Surgical and blood consent signed - Pre-op orders placed (no antibiotics indicated) - Rx tylenol , ibuprofen , and zofran  PRN. Sent to pharmacy.   #Pelvic pain #Endometriosis s/p BSO - Desires definitive surgical management with a hysterectomy. Previously discussed high surgical risk given BMI, previous trouble with ventilation during  laparoscopic BSO, and multiple other co-morbidities (tobacco use, hx PE and DVT). Discussed that I would not recommend an elective procedure at this point. Discussed re-evaluation after weight loss. - Discussed trial of Mirena IUD to see if this helps. This could solve the problem or serve as a bridge while we work to optimize patient's health and decrease risks surrounding surgery. Patient agreeable.   #Hx LEEP - Declines exam/ pap in clinic and wants to defer until OR   #Tobacco use - Reports cutting back- smoking cigarettes 1/day and vaping daily - Continuing to work on quitting - Total of 3 minutes spent on smoking cessation counseling    Osiris Charles Jimmy Moulding, MD

## 2024-05-19 NOTE — Progress Notes (Signed)
 GYN H&P   CC: postmenopausal bleeding   HPI: Gina Farmer 38 y.o. [id number] with a hx of morbid obesity, DVT and PE, bipolar, migraines w/o aura, tobacco use, diverticulosis, endometriosis s/p BSO,  prediabetes, vitamin D deficiency, and vitamin B12 deficiency who presents for follow up of postmenopausal bleeding.   History of lap BSO in 2021 for endometriosis. Reports no vaginal bleeding from time of BSO until April. A few days of pink/red spotting. End of April started bleeding more heavily and bled for about 2 weeks. Had a few days of spotting. No bleeding past few days.    Reports pain improved after BSO. Before surgery on a good day, pain was 7/10 and since then pain is still daily but lower- 3/10. Midline cramping pain. Denies dysuria, urinary incontinence, hematuria. Daily BM now.  PCP: NONE  ROS: All other systems reviewed and negative   PMHx: Past Medical History:  Diagnosis Date   Abnormal uterine bleeding    Anxiety    Arthritis    Bipolar 1 disorder (CMS/HHS-HCC)    Bipolar disorder (CMS/HHS-HCC)    Chickenpox    Clotting disorder (CMS/HHS-HCC)    Depression    Dysmenorrhea    Endometriosis    Endometriosis of uterus    Bilateral salpingo oopharectomy   Essential hypertension 05/05/2024   Migraines    Osteoporosis    Sleep apnea       PSHx: Past Surgical History:  Procedure Laterality Date   Diagnostic laparoscopy  2001   PELVIC LAPAROSCOPY  2003   adhesions   COLONOSCOPY  11/15/2014   Normal Biopsies - no repeat per Dr. Leatrice Provost   ovaries removed  07/2020   APPENDECTOMY     LENS EYE SURGERY  1989, 2004, 2005   lens removed, then lens replaced     OBHx: OB History  Gravida Para Term Preterm AB Living  2 0 0 0 2 0  SAB IAB Ectopic Molar Multiple Live Births  1 1 0   0      # Outcome Date GA Lbr Len/2nd Weight Sex Type Anes PTL Lv  2 SAB           1 IAB              GYN Hx: - LMP: Patient's last menstrual period was 03/28/2024.  - Menses: see HPI -  Menopause: surgical menopause at age 53 - Pap hx: +history of abnormals, last 01/18/20 ASCUS with neg HRHPV. Hx LEEP - STI hx:Hx chlamydia and gonorrhea - Sexual preference: Sexually active with 1 female partner - Dyspareunia or sexual concerns: yes - Contraceptive method: none - Fertility desires: satisfied parity - HPV vaccination: received - Abdominal surgeries: dx lap 2003, appendectomy 2009, dx lap 2021, lap BSO 2021 - GYN procedures: LEEP     FHx: Denies FHx of breast and cervical cancer Ovarian cancer- paternal grandmother (10s)  Uterine or ovarian cancer- maternal aunt Colon cancer- paternal great aunt or uncle   Meds: Current Outpatient Medications on File Prior to Visit  Medication Sig Dispense Refill   celecoxib (CELEBREX) 200 MG capsule Take 1 capsule (200 mg total) by mouth once daily for 30 days 30 capsule 0   methylPREDNISolone  (MEDROL  DOSEPACK) 4 mg tablet Follow package directions. 21 tablet 0   No current facility-administered medications on file prior to visit.    Supplements: Vit D, Vit B   Allergies: Allergies  Allergen Reactions   Lamictal [Lamotrigine] Swelling   Trileptal [Oxcarbazepine] Other (See Comments)  Ankle swelling   Viibryd [Vilazodone] Other (See Comments)    Ankle swelling      SocHx: Social History   Tobacco Use   Smoking status: Light Smoker    Current packs/day: 0.25    Average packs/day: 0.3 packs/day for 19.9 years (5.0 ttl pk-yrs)    Types: Cigarettes    Start date: 06/12/2004   Smokeless tobacco: Never   Tobacco comments:    1-2 cigarettes/day not every day. Vaping daily.   Vaping Use   Vaping status: Every Day  Substance Use Topics   Alcohol use: Yes    Alcohol/week: 1.0 standard drink of alcohol    Types: 1 Shots of liquor per week    Comment: socially   Drug use: No     OBJECTIVE: BP 135/84 (BP Location: Left forearm, Patient Position: Sitting)   Ht 154.9 cm (5\' 1" )   Wt (!) 139.2 kg (306 lb 12.8 oz)   LMP  03/28/2024 Comment: ongoing for the last 2 months.  BMI 57.97 kg/m    Gen: NAD HEENT: [place]/AT Heart: Regular rate Lungs: Normal work of breathing Ext: No BLE edema   Pelvic ultrasound- 05/19/24 Indication: Endovaginal imaging was necessary to evaluate the uterus and ovaries. Probe: [id number].   Uterus ======   Visualized. Size 68 mm x 36 mm x 23 mm Normal Position: anteverted Malformations: none Myometrium: appears normal Endometrium: Fluid filled; walls: 0.23 + 0.25cm=0.48cm(4.53mm). Endometrial thickness, total 6.2 mm Cervix details: Endocervical fluid noted No fibroids identified No polyps identified   Right Ovary =========   Not visualized, right oophorectomy noted   Left Ovary ========   Not visualized, left oophorectomy noted   Cul de Sac =========   Normal. No free fluid visualized   Impression =========   Endovaginal ultrasound performed today due to the indications outlined above.   The sonogram reveals a normal appearing uterus.   The endometrium appears fluid filled measuring 6.24mm   Bilateral oophorectomy   There are no unusual adnexal findings.   There is no free fluid.   Follow-up ========   Endometrial sampling  ASSESSMENT/PLAN: Gina Farmer 38 y.o. [id number] with a hx of morbid obesity, DVT and PE, bipolar, migraines w/o aura, tobacco use, diverticulosis, endometriosis s/p BSO,  prediabetes, vitamin D deficiency, and vitamin B12 deficiency who presents for follow up of postmenopausal bleeding.   #Postmenopausal bleeding - Reports bleeding for past 2 months - Differential: vaginal (atrophy), cervical (dysplasia), versus uterine (fibroids, polyps, hyperplasia, malignancy)  - Due to significant pelvic pain, patient prefers to minimize evaluation in the clinic.   - Pelvic ultrasound 05/19/24: uterus anteverted 6.8x3.6x2.3cm, endometrium fluid filled measuring 6.58mm total of 4.68mm without fluid - Case request submitted for exam under anesthesia,  pap smear, hysteroscopy, D&C, and Mirena IUD placement - Discussed planned procedure, risks, and benefits. Risks such as infection, bleeding, organ damage (including uterine perforation), anesthesia complications, and need for possible blood products were discussed; patient will accept products in case of an emergency. We discussed the risks of a blood transfusion, such as a 1 in 1.2-1.4 million chance of contracting HIV, Hep C. Discussed possibility of needing to perform a laparoscopic procedure in the case of uterine perforation.   - Surgical and blood consent signed - Pre-op orders placed (no antibiotics indicated) - Rx tylenol , ibuprofen , and zofran  PRN. Sent to pharmacy.   #Pelvic pain #Endometriosis s/p BSO - Desires definitive surgical management with a hysterectomy. Previously discussed high surgical risk given BMI, previous trouble with ventilation during  laparoscopic BSO, and multiple other co-morbidities (tobacco use, hx PE and DVT). Discussed that I would not recommend an elective procedure at this point. Discussed re-evaluation after weight loss. - Discussed trial of Mirena IUD to see if this helps. This could solve the problem or serve as a bridge while we work to optimize patient's health and decrease risks surrounding surgery. Patient agreeable.   #Hx LEEP - Declines exam/ pap in clinic and wants to defer until OR   #Tobacco use - Reports cutting back- smoking cigarettes 1/day and vaping daily - Continuing to work on quitting - Total of 3 minutes spent on smoking cessation counseling    Osiris Charles Jimmy Moulding, MD

## 2024-06-02 ENCOUNTER — Encounter
Admission: RE | Admit: 2024-06-02 | Discharge: 2024-06-02 | Disposition: A | Discharge status: Home / Self Care | Source: Ambulatory Visit | Attending: Obstetrics and Gynecology | Admitting: Obstetrics and Gynecology

## 2024-06-02 ENCOUNTER — Other Ambulatory Visit: Payer: Self-pay

## 2024-06-02 HISTORY — DX: Postmenopausal bleeding: N95.0

## 2024-06-02 HISTORY — DX: Dyspnea, unspecified: R06.00

## 2024-06-02 HISTORY — DX: Prediabetes: R73.03

## 2024-06-02 HISTORY — DX: Cardiac murmur, unspecified: R01.1

## 2024-06-02 HISTORY — DX: Other complications of anesthesia, initial encounter: T88.59XA

## 2024-06-02 HISTORY — DX: Headache, unspecified: R51.9

## 2024-06-02 NOTE — Patient Instructions (Addendum)
 Your procedure is scheduled on: 06/06/24 - Tuesday Report to the Registration Desk on the 1st floor of the [facility]. To find out your arrival time, please call [phone] between 1PM - 3PM on: 06/05/24 - Monday If your arrival time is 6:00 am, do not arrive before that time as the [facility] entrance doors do not open until 6:00 am.  REMEMBER: Instructions that are not followed completely may result in serious medical risk, up to and including death; or upon the discretion of your surgeon and anesthesiologist your surgery may need to be rescheduled.  Do not eat food after midnight the night before surgery.  No gum chewing or hard candies.  You may however, drink CLEAR liquids up to 2 hours before you are scheduled to arrive for your surgery. Do not drink anything within 2 hours of your scheduled arrival time.  Clear liquids include: - water  - apple juice without pulp - gatorade (not RED colors) - black coffee or tea (Do NOT add milk or creamers to the coffee or tea) Do NOT drink anything that is not on this list.  In addition, your doctor has ordered for you to drink the provided:  Ensure Pre-Surgery Clear Carbohydrate Drink  Drinking this carbohydrate drink up to two hours before surgery helps to reduce insulin resistance and improve patient outcomes. Please complete drinking 2 hours before scheduled arrival time.  One week prior to surgery: Stop Anti-inflammatories (NSAIDS) such as Advil , Aleve, Ibuprofen , Motrin , Naproxen, Naprosyn and Aspirin based products such as Excedrin, Goody's Powder, BC Powder. You continue to take Tylenol  if needed for pain up until the day of surgery.  Stop ANY OVER THE COUNTER supplements until after surgery.  metFORMIN (GLUCOPHAGE-XR) - hold beginning 06/04/24, may resume after surgery.  Continue taking all of your other prescription medications up until the day before surgery.  ON THE DAY OF SURGERY ONLY TAKE THESE MEDICATIONS WITH SIPS OF  WATER:  celecoxib (CELEBREX)    No Alcohol for 24 hours before or after surgery.  No Smoking including e-cigarettes for 24 hours before surgery.  No chewable tobacco products for at least 6 hours before surgery.  No nicotine patches on the day of surgery.  Do not use any recreational drugs for at least a week (preferably 2 weeks) before your surgery.  Please be advised that the combination of cocaine and anesthesia may have negative outcomes, up to and including death. If you test positive for cocaine, your surgery will be cancelled.  On the morning of surgery brush your teeth with toothpaste and water, you may rinse your mouth with mouthwash if you wish. Do not swallow any toothpaste or mouthwash.  Do not wear jewelry, make-up, hairpins, clips or nail polish.  For welded (permanent) jewelry: bracelets, anklets, waist bands, etc.  Please have this removed prior to surgery.  If it is not removed, there is a chance that hospital personnel will need to cut it off on the day of surgery.  Do not wear lotions, powders, or perfumes.   Do not shave body hair from the neck down 48 hours before surgery.  Contact lenses, hearing aids and dentures may not be worn into surgery.  Do not bring valuables to the hospital. [facility] is not responsible for any missing/lost belongings or valuables. .   Notify your doctor if there is any change in your medical condition (cold, fever, infection).  Wear comfortable clothing (specific to your surgery type) to the hospital.  After surgery,  you can help prevent lung complications by doing breathing exercises.  Take deep breaths and cough every 1-2 hours. Your doctor may order a device called an Incentive Spirometer to help you take deep breaths.  When coughing or sneezing, hold a pillow firmly against your incision with both hands. This is called "splinting." Doing this helps protect your incision. It also decreases belly discomfort.  If you are  being admitted to the hospital overnight, leave your suitcase in the car. After surgery it may be brought to your room.  In case of increased patient census, it may be necessary for you, the patient, to continue your postoperative care in the Same Day Surgery department.  If you are being discharged the day of surgery, you will not be allowed to drive home. You will need a responsible individual to drive you home and stay with you for 24 hours after surgery.   If you are taking public transportation, you will need to have a responsible individual with you.  Please call the Pre-admissions Testing Dept. at [phone] if you have any questions about these instructions.  Surgery Visitation Policy:  Patients having surgery or a procedure may have two visitors.  Children under the age of 38 must have an adult with them who is not the patient.  Inpatient Visitation:    Visiting hours are 7 a.m. to 8 p.m. Up to four visitors are allowed at one time in a patient room. The visitors may rotate out with other people during the day.  One visitor age 38 or older may stay with the patient overnight and must be in the room by 8 p.m.

## 2024-06-05 ENCOUNTER — Encounter
Admission: RE | Admit: 2024-06-05 | Discharge: 2024-06-05 | Discharge status: Home / Self Care | Source: Ambulatory Visit | Attending: Obstetrics and Gynecology

## 2024-06-05 DIAGNOSIS — Z01812 Encounter for preprocedural laboratory examination: Secondary | ICD-10-CM | POA: Insufficient documentation

## 2024-06-05 DIAGNOSIS — Z01818 Encounter for other preprocedural examination: Secondary | ICD-10-CM | POA: Diagnosis present

## 2024-06-05 HISTORY — DX: Personal history of other diseases of the circulatory system: Z86.79

## 2024-06-05 LAB — CBC
HCT: 43.8 % (ref 36.0–46.0)
Hemoglobin: 14.3 g/dL (ref 12.0–15.0)
MCH: 28.5 pg (ref 26.0–34.0)
MCHC: 32.6 g/dL (ref 30.0–36.0)
MCV: 87.3 fL (ref 80.0–100.0)
Platelets: 298 10*3/uL (ref 150–400)
RBC: 5.02 MIL/uL (ref 3.87–5.11)
RDW: 14 % (ref 11.5–15.5)
WBC: 13.3 10*3/uL — ABNORMAL HIGH (ref 4.0–10.5)
nRBC: 0 % (ref 0.0–0.2)

## 2024-06-06 ENCOUNTER — Ambulatory Visit
Admission: RE | Admit: 2024-06-06 | Discharge: 2024-06-06 | Disposition: A | Discharge status: Home / Self Care | Attending: Obstetrics and Gynecology | Admitting: Obstetrics and Gynecology

## 2024-06-06 ENCOUNTER — Ambulatory Visit: Admitting: Anesthesiology

## 2024-06-06 ENCOUNTER — Encounter: Payer: Self-pay | Admitting: Obstetrics and Gynecology

## 2024-06-06 ENCOUNTER — Other Ambulatory Visit: Payer: Self-pay

## 2024-06-06 ENCOUNTER — Encounter
Admission: RE | Disposition: A | Discharge status: Home / Self Care | Payer: Self-pay | Source: Home / Self Care | Attending: Obstetrics and Gynecology

## 2024-06-06 ENCOUNTER — Ambulatory Visit: Payer: Self-pay | Admitting: Urgent Care

## 2024-06-06 DIAGNOSIS — R102 Pelvic and perineal pain: Secondary | ICD-10-CM | POA: Insufficient documentation

## 2024-06-06 DIAGNOSIS — F319 Bipolar disorder, unspecified: Secondary | ICD-10-CM | POA: Diagnosis not present

## 2024-06-06 DIAGNOSIS — N809 Endometriosis, unspecified: Secondary | ICD-10-CM | POA: Diagnosis not present

## 2024-06-06 DIAGNOSIS — N950 Postmenopausal bleeding: Secondary | ICD-10-CM | POA: Insufficient documentation

## 2024-06-06 DIAGNOSIS — Z30430 Encounter for insertion of intrauterine contraceptive device: Secondary | ICD-10-CM | POA: Insufficient documentation

## 2024-06-06 DIAGNOSIS — G4730 Sleep apnea, unspecified: Secondary | ICD-10-CM | POA: Diagnosis not present

## 2024-06-06 DIAGNOSIS — K219 Gastro-esophageal reflux disease without esophagitis: Secondary | ICD-10-CM | POA: Diagnosis not present

## 2024-06-06 DIAGNOSIS — Z01818 Encounter for other preprocedural examination: Secondary | ICD-10-CM

## 2024-06-06 DIAGNOSIS — Z6841 Body Mass Index (BMI) 40.0 and over, adult: Secondary | ICD-10-CM | POA: Insufficient documentation

## 2024-06-06 DIAGNOSIS — R7303 Prediabetes: Secondary | ICD-10-CM | POA: Insufficient documentation

## 2024-06-06 DIAGNOSIS — N840 Polyp of corpus uteri: Secondary | ICD-10-CM | POA: Diagnosis not present

## 2024-06-06 DIAGNOSIS — J45909 Unspecified asthma, uncomplicated: Secondary | ICD-10-CM | POA: Diagnosis not present

## 2024-06-06 DIAGNOSIS — F17210 Nicotine dependence, cigarettes, uncomplicated: Secondary | ICD-10-CM | POA: Diagnosis not present

## 2024-06-06 DIAGNOSIS — Z975 Presence of (intrauterine) contraceptive device: Secondary | ICD-10-CM

## 2024-06-06 HISTORY — PX: EXAM UNDER ANESTHESIA, PELVIC: SHX7461

## 2024-06-06 HISTORY — PX: HYSTEROSCOPY WITH D & C: SHX1775

## 2024-06-06 HISTORY — PX: INTRAUTERINE DEVICE (IUD) INSERTION: SHX5877

## 2024-06-06 LAB — TYPE AND SCREEN
ABO/RH(D): AB POS
Antibody Screen: NEGATIVE

## 2024-06-06 SURGERY — EXAM UNDER ANESTHESIA, PELVIC
Anesthesia: General | Site: Uterus

## 2024-06-06 MED ORDER — KETOROLAC TROMETHAMINE 30 MG/ML IJ SOLN
INTRAMUSCULAR | Status: DC | PRN
  Administered 2024-06-06: 30 mg via INTRAVENOUS

## 2024-06-06 MED ORDER — FENTANYL CITRATE (PF) 100 MCG/2ML IJ SOLN
INTRAMUSCULAR | Status: AC
  Filled 2024-06-06: qty 2

## 2024-06-06 MED ORDER — OXYCODONE HCL 5 MG PO TABS
5.0000 mg | ORAL_TABLET | Freq: Once | ORAL | Status: AC | PRN
  Administered 2024-06-06: 5 mg via ORAL

## 2024-06-06 MED ORDER — FENTANYL CITRATE (PF) 100 MCG/2ML IJ SOLN
25.0000 ug | INTRAMUSCULAR | Status: AC | PRN
  Administered 2024-06-06 (×2): 25 ug via INTRAVENOUS
  Administered 2024-06-06 (×2): 50 ug via INTRAVENOUS
  Administered 2024-06-06 (×2): 25 ug via INTRAVENOUS

## 2024-06-06 MED ORDER — LEVONORGESTREL 20 MCG/DAY IU IUD
INTRAUTERINE_SYSTEM | INTRAUTERINE | Status: AC
  Filled 2024-06-06: qty 1

## 2024-06-06 MED ORDER — OXYCODONE HCL 5 MG PO TABS
ORAL_TABLET | ORAL | Status: AC
  Filled 2024-06-06: qty 1

## 2024-06-06 MED ORDER — MIDAZOLAM HCL 2 MG/2ML IJ SOLN
INTRAMUSCULAR | Status: DC | PRN
  Administered 2024-06-06: 2 mg via INTRAVENOUS

## 2024-06-06 MED ORDER — GABAPENTIN 300 MG PO CAPS
ORAL_CAPSULE | ORAL | Status: AC
Start: 2024-06-06 — End: 2024-06-06
  Filled 2024-06-06: qty 1

## 2024-06-06 MED ORDER — POVIDONE-IODINE 10 % EX SWAB: 2.0000 | Freq: Once | CUTANEOUS | Status: DC

## 2024-06-06 MED ORDER — LIDOCAINE-EPINEPHRINE 1 %-1:100000 IJ SOLN
INTRAMUSCULAR | Status: AC
  Filled 2024-06-06: qty 2

## 2024-06-06 MED ORDER — LACTATED RINGERS IV SOLN: INTRAVENOUS | Status: DC

## 2024-06-06 MED ORDER — PROPOFOL 10 MG/ML IV BOLUS
INTRAVENOUS | Status: DC | PRN
Start: 2024-06-06 — End: 2024-06-06
  Administered 2024-06-06: 200 mg via INTRAVENOUS

## 2024-06-06 MED ORDER — PROPOFOL 10 MG/ML IV BOLUS
INTRAVENOUS | Status: AC
  Filled 2024-06-06: qty 20

## 2024-06-06 MED ORDER — MONSELS FERRIC SUBSULFATE EX SOLN
CUTANEOUS | Status: AC
  Filled 2024-06-06: qty 8

## 2024-06-06 MED ORDER — ONDANSETRON HCL 4 MG/2ML IJ SOLN
INTRAMUSCULAR | Status: DC | PRN
  Administered 2024-06-06: 4 mg via INTRAVENOUS

## 2024-06-06 MED ORDER — 0.9 % SODIUM CHLORIDE (POUR BTL) OPTIME
TOPICAL | Status: DC | PRN
  Administered 2024-06-06: 500 mL

## 2024-06-06 MED ORDER — ORAL CARE MOUTH RINSE: 15.0000 mL | Freq: Once | OROMUCOSAL | Status: AC

## 2024-06-06 MED ORDER — GABAPENTIN 300 MG PO CAPS
300.0000 mg | ORAL_CAPSULE | ORAL | Status: AC
  Administered 2024-06-06: 300 mg via ORAL

## 2024-06-06 MED ORDER — MIDAZOLAM HCL 2 MG/2ML IJ SOLN
INTRAMUSCULAR | Status: AC
Start: 2024-06-06 — End: 2024-06-06
  Filled 2024-06-06: qty 2

## 2024-06-06 MED ORDER — SUCCINYLCHOLINE CHLORIDE 200 MG/10ML IV SOSY
PREFILLED_SYRINGE | INTRAVENOUS | Status: DC | PRN
  Administered 2024-06-06: 140 mg via INTRAVENOUS

## 2024-06-06 MED ORDER — CHLORHEXIDINE GLUCONATE 0.12 % MT SOLN
OROMUCOSAL | Status: AC
  Filled 2024-06-06: qty 15

## 2024-06-06 MED ORDER — LACTATED RINGERS IV SOLN: INTRAVENOUS | Status: DC | PRN

## 2024-06-06 MED ORDER — ACETAMINOPHEN 500 MG PO TABS
1000.0000 mg | ORAL_TABLET | ORAL | Status: AC
  Administered 2024-06-06: 1000 mg via ORAL

## 2024-06-06 MED ORDER — OXYCODONE HCL 5 MG/5ML PO SOLN: 5.0000 mg | Freq: Once | ORAL | Status: AC | PRN

## 2024-06-06 MED ORDER — PHENYLEPHRINE 80 MCG/ML (10ML) SYRINGE FOR IV PUSH (FOR BLOOD PRESSURE SUPPORT)
PREFILLED_SYRINGE | INTRAVENOUS | Status: DC | PRN
  Administered 2024-06-06 (×2): 40 ug via INTRAVENOUS
  Administered 2024-06-06: 80 ug via INTRAVENOUS

## 2024-06-06 MED ORDER — LEVONORGESTREL 20 MCG/DAY IU IUD
1.0000 | INTRAUTERINE_SYSTEM | Freq: Once | INTRAUTERINE | Status: AC
  Administered 2024-06-06: 1 via INTRAUTERINE
  Filled 2024-06-06: qty 1

## 2024-06-06 MED ORDER — SODIUM CHLORIDE 0.9 % IV SOLN: INTRAVENOUS | Status: DC

## 2024-06-06 MED ORDER — DEXAMETHASONE SODIUM PHOSPHATE 10 MG/ML IJ SOLN
INTRAMUSCULAR | Status: DC | PRN
  Administered 2024-06-06: 4 mg via INTRAVENOUS

## 2024-06-06 MED ORDER — CHLORHEXIDINE GLUCONATE 0.12 % MT SOLN
15.0000 mL | Freq: Once | OROMUCOSAL | Status: AC
Start: 2024-06-06 — End: 2024-06-06
  Administered 2024-06-06: 15 mL via OROMUCOSAL

## 2024-06-06 MED ORDER — LIDOCAINE HCL (CARDIAC) PF 100 MG/5ML IV SOSY
PREFILLED_SYRINGE | INTRAVENOUS | Status: DC | PRN
  Administered 2024-06-06: 80 mg via INTRAVENOUS

## 2024-06-06 MED ORDER — FENTANYL CITRATE (PF) 100 MCG/2ML IJ SOLN
INTRAMUSCULAR | Status: DC | PRN
  Administered 2024-06-06: 100 ug via INTRAVENOUS

## 2024-06-06 MED ORDER — LIDOCAINE-EPINEPHRINE 1 %-1:100000 IJ SOLN
INTRAMUSCULAR | Status: DC | PRN
  Administered 2024-06-06: 20 mL

## 2024-06-06 MED ORDER — ACETAMINOPHEN 500 MG PO TABS
ORAL_TABLET | ORAL | Status: AC
  Filled 2024-06-06: qty 2

## 2024-06-06 SURGICAL SUPPLY — 33 items
APPLICATOR COTTON TIP 6 STRL (MISCELLANEOUS) IMPLANT
APPLICATOR SWAB PROCTO LG 16IN (MISCELLANEOUS) IMPLANT
DEVICE MYOSURE LITE (MISCELLANEOUS) IMPLANT
DEVICE MYOSURE REACH (MISCELLANEOUS) IMPLANT
DRSG TELFA 3X8 NADH STRL (GAUZE/BANDAGES/DRESSINGS) IMPLANT
GLOVE SURG SYN 6.5 ES PF (GLOVE) ×4 IMPLANT
GLOVE SURG SYN 6.5 PF PI (GLOVE) ×4 IMPLANT
GLOVE SURG SYN 8.0 (GLOVE) ×2 IMPLANT
GLOVE SURG SYN 8.0 PF PI (GLOVE) ×2 IMPLANT
GOWN STRL REUS W/ TWL LRG LVL3 (GOWN DISPOSABLE) ×2 IMPLANT
KIT PINK PAD W/HEAD ARM REST (MISCELLANEOUS) IMPLANT
KIT PROCEDURE FLUENT (KITS) IMPLANT
KIT TURNOVER CYSTO (KITS) ×2 IMPLANT
MANIFOLD NEPTUNE II (INSTRUMENTS) ×2 IMPLANT
Mirena (Levonogestrel-releasing uterine system) IMPLANT
NDL HYPO 22X1.5 SAFETY MO (MISCELLANEOUS) ×2 IMPLANT
NEEDLE HYPO 22X1.5 SAFETY MO (MISCELLANEOUS) ×2 IMPLANT
NS IRRIG 500ML POUR BTL (IV SOLUTION) ×2 IMPLANT
PACK DNC HYST (MISCELLANEOUS) ×2 IMPLANT
PAD OB MATERNITY 11 LF (PERSONAL CARE ITEMS) ×2 IMPLANT
PAD PREP OB/GYN DISP 24X41 (PERSONAL CARE ITEMS) ×2 IMPLANT
SCRUB CHG 4% DYNA-HEX 4OZ (MISCELLANEOUS) ×2 IMPLANT
SEAL ROD LENS SCOPE MYOSURE (ABLATOR) ×2 IMPLANT
SET CYSTO W/LG BORE CLAMP LF (SET/KITS/TRAYS/PACK) IMPLANT
SOL .9 NS 3000ML IRR UROMATIC (IV SOLUTION) ×2 IMPLANT
SOLUTION PREP PVP 2OZ (MISCELLANEOUS) ×2 IMPLANT
SUT MNCRL AB 4-0 PS2 18 (SUTURE) IMPLANT
SUT VIC AB 3-0 SH 27X BRD (SUTURE) IMPLANT
SYR CONTROL 10ML LL (SYRINGE) ×2 IMPLANT
TOWEL OR 17X26 4PK STRL BLUE (TOWEL DISPOSABLE) ×2 IMPLANT
TRAP FLUID SMOKE EVACUATOR (MISCELLANEOUS) ×2 IMPLANT
TUBING CONNECTING 10 (TUBING) IMPLANT
WATER STERILE IRR 500ML POUR (IV SOLUTION) ×2 IMPLANT

## 2024-06-06 NOTE — Op Note (Addendum)
 Operative Note  Name: RYONNA CIMINI MRN: [id number] Date: 06/06/24     Preoperative diagnosis: Postmenopausal bleeding, pelvic pain, endometriosis, cervical cancer screening Postoperative diagnosis: Same   Procedure: Exam under anesthesia, Pap smear, Hysteroscopy, Dilation & Curettage, Myosure, Mirena IUD insertion Surgeon: Beverli Dinsmore, MD Anesthesia: General    EBL: 5cc  IVF: 250cc UOP: 30cc Fluid deficit: 300cc   Complications: none  Specimens: endometrial currettings  Findings: Unable to palpate uterus on bimanual due to body habitus. Normal external female genitalia other than clitoral hood plastic ring. Normal urethral meatus. Vaginal mucosa pale pink with decreased rugae. Cervix nulliparous with pinpoint os. Uterus was sounded to 7cm, small polypoid lesion on posterior endocervical cancer but otherwise normal appearing endocervical canal, uterine cavity atrophic with erythema other than slight protrusion of tissue on posterior- resected. Bilateral ostia visualized.    Procedure:  The risks, benefits, indications, and alternative of the procedure were reviewed with the patient in pre-operative area and informed consent was obtained. The patient was taken to the operating room with IV in place. General anesthesia was administered. She was placed in the dorsal lithotomy position, prepped and draped in the usual sterile fashion. A red rubber catheter was used to drain the bladder. A sterile speculum was placed in the patient's vagina and the cervix visualized. Above findings noted. A pap smear was collected. A single tooth tenaculum was placed on the anterior lip of the cervix. Paracervical block performed by injecting a total of 20cc of 1% lidocaine  with epinephrine into the vaginal fornices at 4 and 8 o'clock (care was taken to pull back prior to injection). The uterus was then gently sounded to 7 cm. The cervix was dilated to allow for hysteroscope using Pratt dilators. The  hysteroscopy was done with normal saline with the above findings were noted. The Myosure was used to remove the area of tissue on the posterior endometrium and the polypoid lesion in the endocervical canal. The hysteroscopy was then removed. The IUD was inserted to the fundus, deployed, and the applicator was removed.  The strings were cut 3 cm from the external cervical os. Tenaculum was removed and hemostasis was noted.   Specimens were sent to pathology. Pap specimen was sent to pathology from clinic. Vaginal instruments were removed. Hemostasis noted. The patient tolerated the procedure well and was taken to the recovery room in stable condition.   Signed by: Beverli LULLA Dinsmore, Md, 06/06/24, 10:43 AM

## 2024-06-06 NOTE — Progress Notes (Signed)
 Updated patient family. Patient wants the IUD removed, and Dr. D.Schermerhorn will be here in 45 minutes to take it out.

## 2024-06-06 NOTE — Interval H&P Note (Signed)
 History and Physical Interval Note:  06/06/2024 9:45 AM  Gina Farmer  has presented today for surgery, with the diagnosis of postmenopausal bleeding.  The various methods of treatment have been discussed with the patient and family. After consideration of risks, benefits and other options for treatment, the patient has consented to  Procedure(s) with comments: EXAM UNDER ANESTHESIA, PELVIC (N/A) DILATATION AND CURETTAGE /HYSTEROSCOPY (N/A) INSERTION, INTRAUTERINE DEVICE (N/A) - MIRENA as a surgical intervention.  The patient's history has been reviewed, patient examined, no change in status, stable for surgery.  I have reviewed the patient's chart and labs.  Questions were answered to the patient's satisfaction.     Cambryn Charters V Karin Pinedo

## 2024-06-06 NOTE — Anesthesia Procedure Notes (Signed)
 Procedure Name: Intubation Date/Time: 06/06/2024 10:02 AM  Performed by: Jackye Spanner, CRNAPre-anesthesia Checklist: Patient identified, Patient being monitored, Timeout performed, Emergency Drugs available and Suction available Patient Re-evaluated:Patient Re-evaluated prior to induction Oxygen Delivery Method: Circle system utilized Preoxygenation: Pre-oxygenation with 100% oxygen Induction Type: IV induction Ventilation: Two handed mask ventilation required Laryngoscope Size: 3 and McGrath Grade View: Grade I Tube type: Oral Tube size: 7.0 mm Number of attempts: 1 Airway Equipment and Method: Stylet Placement Confirmation: ETT inserted through vocal cords under direct vision, positive ETCO2 and breath sounds checked- equal and bilateral Secured at: 20 cm Tube secured with: Tape Dental Injury: Teeth and Oropharynx as per pre-operative assessment  Comments: Smooth atraumatic intubation, no complications noted.

## 2024-06-06 NOTE — Anesthesia Preprocedure Evaluation (Signed)
 Anesthesia Evaluation  Patient identified by MRN, date of birth, ID band Patient awake    Reviewed: Allergy & Precautions, NPO status , Patient's Chart, lab work & pertinent test results  History of Anesthesia Complications (+) history of anesthetic complications  Airway Mallampati: III  TM Distance: <3 FB Neck ROM: full    Dental  (+) Chipped, Poor Dentition, Loose   Pulmonary shortness of breath and with exertion, asthma , sleep apnea , Current Smoker and Patient abstained from smoking.   Pulmonary exam normal        Cardiovascular Exercise Tolerance: Good (-) angina (-) Past MI Normal cardiovascular exam     Neuro/Psych  Headaches PSYCHIATRIC DISORDERS         GI/Hepatic Neg liver ROS,GERD  Controlled,,  Endo/Other  negative endocrine ROS    Renal/GU      Musculoskeletal   Abdominal   Peds  Hematology negative hematology ROS (+)   Anesthesia Other Findings Past Medical History: No date: Anxiety No date: Back injury No date: Bipolar 1 disorder (HCC) No date: Bipolar 2 disorder (HCC) No date: Complication of anesthesia     Comment:  stop breathing during surgery - when ovaries were being               removed, suspects sleep apnea 2023: Deep vein thrombosis (DVT) of left lower extremity (HCC) No date: Depression No date: Dyspnea No date: Family history of ovarian cancer     Comment:  2/21 cancer genetic testing letter sent No date: GERD (gastroesophageal reflux disease) No date: Headache No date: History of heart murmur in childhood No date: Increased pressure in the eye, left No date: Morbid obesity (HCC) No date: Post traumatic stress disorder (PTSD) No date: Postmenopausal bleeding No date: Pre-diabetes 2023: Pulmonary embolism [facility])  Past Surgical History: 2009: APPENDECTOMY No date: INTRAOCULAR LENS IMPLANT, SECONDARY; Left     Comment:  from eye injury 07/25/2020: LAPAROSCOPIC BILATERAL  SALPINGO OOPHERECTOMY; Bilateral     Comment:  Procedure: LAPAROSCOPIC BILATERAL SALPINGO OOPHORECTOMY;              Surgeon: Lake Read, MD;  Location: [facility] ORS;                Service: Gynecology;  Laterality: Bilateral; 2003: LAPAROSCOPY 05/21/2020: LAPAROSCOPY; N/A     Comment:  Procedure: LAPAROSCOPY DIAGNOSTIC;  Surgeon: Lake Read, MD;  Location: [facility] ORS;  Service: Gynecology;                Laterality: N/A; No date: SACROILIAC JOINT INJECTION     Reproductive/Obstetrics negative OB ROS                             Anesthesia Physical Anesthesia Plan  ASA: 3  Anesthesia Plan: General ETT   Post-op Pain Management:    Induction: Intravenous  PONV Risk Score and Plan: Ondansetron , Dexamethasone , Midazolam  and Treatment may vary due to age or medical condition  Airway Management Planned: Oral ETT  Additional Equipment:   Intra-op Plan:   Post-operative Plan: Extubation in OR  Informed Consent: I have reviewed the patients History and Physical, chart, labs and discussed the procedure including the risks, benefits and alternatives for the proposed anesthesia with the patient or authorized representative who has indicated his/her understanding and acceptance.     Dental Advisory Given  Plan Discussed with: Anesthesiologist,  CRNA and Surgeon  Anesthesia Plan Comments: (Patient consented for risks of anesthesia including but not limited to:  - adverse reactions to medications - damage to eyes, teeth, lips or other oral mucosa - nerve damage due to positioning  - sore throat or hoarseness - Damage to heart, brain, nerves, lungs, other parts of body or loss of life  Patient voiced understanding and assent.)       Anesthesia Quick Evaluation

## 2024-06-06 NOTE — Transfer of Care (Signed)
 Immediate Anesthesia Transfer of Care Note  Patient: Gina Farmer  Procedure(s) Performed: ERASMO UNDER ANESTHESIA, PELVIC (Pelvis) DILATATION AND CURETTAGE /HYSTEROSCOPY/PAPSMEAR (Pelvis) INSERTION, INTRAUTERINE DEVICE (Uterus)  Patient Location: PACU  Anesthesia Type:General  Level of Consciousness: awake and drowsy  Airway & Oxygen Therapy: Patient Spontanous Breathing and Patient connected to face mask oxygen  Post-op Assessment: Report given to RN and Post -op Vital signs reviewed and stable  Post vital signs: Reviewed and stable  Last Vitals:  Vitals Value Taken Time  BP 140/100 06/06/24 10:45  Temp 36.1 1045  Pulse 83 06/06/24 10:47  Resp 24 06/06/24 10:47  SpO2 95 % 06/06/24 10:47  Vitals shown include unfiled device data.  Last Pain:  Vitals:   06/06/24 0941  PainSc: 0-No pain         Complications: No notable events documented.

## 2024-06-06 NOTE — H&P (Signed)
 GYN H&P   CC: postmenopausal bleeding   HPI: ALICESON DOLBOW 38 y.o. G2P0020 with a hx of morbid obesity, DVT and PE, bipolar, migraines w/o aura, tobacco use, diverticulosis, endometriosis s/p BSO,  prediabetes, vitamin D deficiency, and vitamin B12 deficiency who presents for follow up of postmenopausal bleeding.   History of lap BSO in 2021 for endometriosis. Reports no vaginal bleeding from time of BSO until April. A few days of pink/red spotting. End of April started bleeding more heavily and bled for about 2 weeks. Had a few days of spotting. No bleeding past few days.    Reports pain improved after BSO. Before surgery on a good day, pain was 7/10 and since then pain is still daily but lower- 3/10. Midline cramping pain. Denies dysuria, urinary incontinence, hematuria. Daily BM now.  PCP: NONE  ROS: All other systems reviewed and negative   PMHx: Past Medical History:  Diagnosis Date   Abnormal uterine bleeding    Anxiety    Arthritis    Bipolar 1 disorder (CMS/HHS-HCC)    Bipolar disorder (CMS/HHS-HCC)    Chickenpox    Clotting disorder (CMS/HHS-HCC)    Depression    Dysmenorrhea    Endometriosis    Endometriosis of uterus    Bilateral salpingo oopharectomy   Essential hypertension 05/05/2024   Migraines    Osteoporosis    Sleep apnea       PSHx: Past Surgical History:  Procedure Laterality Date   Diagnostic laparoscopy  2001   PELVIC LAPAROSCOPY  2003   adhesions   COLONOSCOPY  11/15/2014   Normal Biopsies - no repeat per Dr. Leatrice Provost   ovaries removed  07/2020   APPENDECTOMY     LENS EYE SURGERY  1989, 2004, 2005   lens removed, then lens replaced     OBHx: OB History  Gravida Para Term Preterm AB Living  2 0 0 0 2 0  SAB IAB Ectopic Molar Multiple Live Births  1 1 0   0      # Outcome Date GA Lbr Len/2nd Weight Sex Type Anes PTL Lv  2 SAB           1 IAB              GYN Hx: - LMP: Patient's last menstrual period was 03/28/2024.  - Menses: see HPI -  Menopause: surgical menopause at age 53 - Pap hx: +history of abnormals, last 01/18/20 ASCUS with neg HRHPV. Hx LEEP - STI hx:Hx chlamydia and gonorrhea - Sexual preference: Sexually active with 1 female partner - Dyspareunia or sexual concerns: yes - Contraceptive method: none - Fertility desires: satisfied parity - HPV vaccination: received - Abdominal surgeries: dx lap 2003, appendectomy 2009, dx lap 2021, lap BSO 2021 - GYN procedures: LEEP     FHx: Denies FHx of breast and cervical cancer Ovarian cancer- paternal grandmother (10s)  Uterine or ovarian cancer- maternal aunt Colon cancer- paternal great aunt or uncle   Meds: Current Outpatient Medications on File Prior to Visit  Medication Sig Dispense Refill   celecoxib (CELEBREX) 200 MG capsule Take 1 capsule (200 mg total) by mouth once daily for 30 days 30 capsule 0   methylPREDNISolone  (MEDROL  DOSEPACK) 4 mg tablet Follow package directions. 21 tablet 0   No current facility-administered medications on file prior to visit.    Supplements: Vit D, Vit B   Allergies: Allergies  Allergen Reactions   Lamictal [Lamotrigine] Swelling   Trileptal [Oxcarbazepine] Other (See Comments)  Ankle swelling   Viibryd [Vilazodone] Other (See Comments)    Ankle swelling      SocHx: Social History   Tobacco Use   Smoking status: Light Smoker    Current packs/day: 0.25    Average packs/day: 0.3 packs/day for 19.9 years (5.0 ttl pk-yrs)    Types: Cigarettes    Start date: 06/12/2004   Smokeless tobacco: Never   Tobacco comments:    1-2 cigarettes/day not every day. Vaping daily.   Vaping Use   Vaping status: Every Day  Substance Use Topics   Alcohol use: Yes    Alcohol/week: 1.0 standard drink of alcohol    Types: 1 Shots of liquor per week    Comment: socially   Drug use: No     OBJECTIVE: BP 135/84 (BP Location: Left forearm, Patient Position: Sitting)   Ht 154.9 cm (5\' 1" )   Wt (!) 139.2 kg (306 lb 12.8 oz)   LMP  03/28/2024 Comment: ongoing for the last 2 months.  BMI 57.97 kg/m    Gen: NAD HEENT: [place]/AT Heart: Regular rate Lungs: Normal work of breathing Ext: No BLE edema   Pelvic ultrasound- 05/19/24 Indication: Endovaginal imaging was necessary to evaluate the uterus and ovaries. Probe: [id number].   Uterus ======   Visualized. Size 68 mm x 36 mm x 23 mm Normal Position: anteverted Malformations: none Myometrium: appears normal Endometrium: Fluid filled; walls: 0.23 + 0.25cm=0.48cm(4.53mm). Endometrial thickness, total 6.2 mm Cervix details: Endocervical fluid noted No fibroids identified No polyps identified   Right Ovary =========   Not visualized, right oophorectomy noted   Left Ovary ========   Not visualized, left oophorectomy noted   Cul de Sac =========   Normal. No free fluid visualized   Impression =========   Endovaginal ultrasound performed today due to the indications outlined above.   The sonogram reveals a normal appearing uterus.   The endometrium appears fluid filled measuring 6.24mm   Bilateral oophorectomy   There are no unusual adnexal findings.   There is no free fluid.   Follow-up ========   Endometrial sampling  ASSESSMENT/PLAN: OTTILIA PIPPENGER 38 y.o. [id number] with a hx of morbid obesity, DVT and PE, bipolar, migraines w/o aura, tobacco use, diverticulosis, endometriosis s/p BSO,  prediabetes, vitamin D deficiency, and vitamin B12 deficiency who presents for follow up of postmenopausal bleeding.   #Postmenopausal bleeding - Reports bleeding for past 2 months - Differential: vaginal (atrophy), cervical (dysplasia), versus uterine (fibroids, polyps, hyperplasia, malignancy)  - Due to significant pelvic pain, patient prefers to minimize evaluation in the clinic.   - Pelvic ultrasound 05/19/24: uterus anteverted 6.8x3.6x2.3cm, endometrium fluid filled measuring 6.58mm total of 4.68mm without fluid - Case request submitted for exam under anesthesia,  pap smear, hysteroscopy, D&C, and Mirena IUD placement - Discussed planned procedure, risks, and benefits. Risks such as infection, bleeding, organ damage (including uterine perforation), anesthesia complications, and need for possible blood products were discussed; patient will accept products in case of an emergency. We discussed the risks of a blood transfusion, such as a 1 in 1.2-1.4 million chance of contracting HIV, Hep C. Discussed possibility of needing to perform a laparoscopic procedure in the case of uterine perforation.   - Surgical and blood consent signed - Pre-op orders placed (no antibiotics indicated) - Rx tylenol , ibuprofen , and zofran  PRN. Sent to pharmacy.   #Pelvic pain #Endometriosis s/p BSO - Desires definitive surgical management with a hysterectomy. Previously discussed high surgical risk given BMI, previous trouble with ventilation during  laparoscopic BSO, and multiple other co-morbidities (tobacco use, hx PE and DVT). Discussed that I would not recommend an elective procedure at this point. Discussed re-evaluation after weight loss. - Discussed trial of Mirena IUD to see if this helps. This could solve the problem or serve as a bridge while we work to optimize patient's health and decrease risks surrounding surgery. Patient agreeable.   #Hx LEEP - Declines exam/ pap in clinic and wants to defer until OR   #Tobacco use - Reports cutting back- smoking cigarettes 1/day and vaping daily - Continuing to work on quitting - Total of 3 minutes spent on smoking cessation counseling    Osiris Charles Jimmy Moulding, MD

## 2024-06-06 NOTE — Anesthesia Postprocedure Evaluation (Signed)
 Anesthesia Post Note  Patient: Gina Farmer  Procedure(s) Performed: EXAM UNDER ANESTHESIA, PELVIC (Pelvis) DILATATION AND CURETTAGE /HYSTEROSCOPY/PAPSMEAR (Pelvis) INSERTION, INTRAUTERINE DEVICE (Uterus)  Patient location during evaluation: PACU Anesthesia Type: General Level of consciousness: awake and alert Pain management: pain level controlled Vital Signs Assessment: post-procedure vital signs reviewed and stable Respiratory status: spontaneous breathing, nonlabored ventilation and respiratory function stable Cardiovascular status: blood pressure returned to baseline and stable Postop Assessment: no apparent nausea or vomiting Anesthetic complications: no   No notable events documented.   Last Vitals:  Vitals:   06/06/24 1115 06/06/24 1130  BP: (!) 142/80 127/72  Pulse: 79 77  Resp: 17 16  Temp:    SpO2: 94% 95%    Last Pain:  Vitals:   06/06/24 1125  PainSc: 8                  Fairy POUR Gretel Cantu

## 2024-06-07 LAB — SURGICAL PATHOLOGY

## 2024-06-07 NOTE — Plan of Care (Signed)
 Delayed entry from 06/06/24 @1300   Called by PACU staff that patient in 10/10 pain and wants IUD out.  To bedside. Patient confirms that does not want to keep the IUD in place.   Procedure: IUD removal  Speculum placed in the vagina. Cervix was visualized with IUD strings. IUD strings grasped with ringed forceps and IUD removed with gentle traction. Speculum removed.  Patient tolerated procedure well.  Gio Janoski V Shloma Roggenkamp, MD 06/07/2024 7:30 AM

## 2024-06-08 ENCOUNTER — Encounter: Payer: Self-pay | Admitting: Obstetrics and Gynecology

## 2024-06-14 ENCOUNTER — Ambulatory Visit: Payer: Self-pay | Admitting: Obstetrics and Gynecology

## 2024-06-15 ENCOUNTER — Inpatient Hospital Stay

## 2024-06-15 ENCOUNTER — Other Ambulatory Visit

## 2024-06-15 ENCOUNTER — Inpatient Hospital Stay: Admitting: Licensed Clinical Social Worker

## 2024-07-11 ENCOUNTER — Other Ambulatory Visit: Payer: Self-pay | Admitting: Medical Genetics

## 2024-07-19 ENCOUNTER — Inpatient Hospital Stay

## 2024-07-19 ENCOUNTER — Inpatient Hospital Stay: Attending: Licensed Clinical Social Worker | Admitting: Licensed Clinical Social Worker

## 2024-07-24 ENCOUNTER — Other Ambulatory Visit

## 2024-09-06 ENCOUNTER — Other Ambulatory Visit: Payer: Self-pay | Admitting: Podiatry

## 2024-09-06 DIAGNOSIS — D489 Neoplasm of uncertain behavior, unspecified: Secondary | ICD-10-CM

## 2024-09-07 ENCOUNTER — Encounter: Payer: Self-pay | Admitting: Podiatry

## 2024-09-12 ENCOUNTER — Encounter: Payer: Self-pay | Admitting: Podiatry

## 2024-09-15 ENCOUNTER — Ambulatory Visit
Admission: RE | Admit: 2024-09-15 | Discharge: 2024-09-15 | Disposition: A | Discharge status: Home / Self Care | Source: Ambulatory Visit | Attending: Podiatry | Admitting: Podiatry

## 2024-09-15 DIAGNOSIS — D489 Neoplasm of uncertain behavior, unspecified: Secondary | ICD-10-CM

## 2024-09-24 ENCOUNTER — Other Ambulatory Visit: Payer: Self-pay | Admitting: Medical Genetics

## 2024-09-24 DIAGNOSIS — Z006 Encounter for examination for normal comparison and control in clinical research program: Secondary | ICD-10-CM

## 2024-10-20 ENCOUNTER — Other Ambulatory Visit: Payer: Self-pay | Admitting: Podiatry

## 2024-10-23 ENCOUNTER — Other Ambulatory Visit: Payer: Self-pay

## 2024-10-23 ENCOUNTER — Encounter
Admission: RE | Admit: 2024-10-23 | Discharge: 2024-10-23 | Disposition: A | Discharge status: Home / Self Care | Source: Ambulatory Visit | Attending: Podiatry | Admitting: Podiatry

## 2024-10-23 VITALS — Wt 334.0 lb

## 2024-10-23 DIAGNOSIS — Z01818 Encounter for other preprocedural examination: Secondary | ICD-10-CM

## 2024-10-23 HISTORY — DX: Other specified arthritis, unspecified site: M13.80

## 2024-10-23 HISTORY — DX: Sleep apnea, unspecified: G47.30

## 2024-10-23 HISTORY — DX: Cardiomegaly: I51.7

## 2024-10-23 NOTE — Patient Instructions (Addendum)
 Your procedure is scheduled on:10-27-24 Friday Report to the Registration Desk on the 1st floor of the [facility].Then proceed to the 2nd floor Surgery Desk To find out your arrival time, please call [phone] between 1PM - 3PM on:10-26-24 Thursday If your arrival time is 6:00 am, do not arrive before that time as the [facility] entrance doors do not open until 6:00 am.  REMEMBER: Instructions that are not followed completely may result in serious medical risk, up to and including death; or upon the discretion of your surgeon and anesthesiologist your surgery may need to be rescheduled.  Do not eat food after midnight the night before surgery.  No gum chewing or hard candies.  You may however, drink CLEAR liquids up to 2 hours before you are scheduled to arrive for your surgery. Do not drink anything within 2 hours of your scheduled arrival time.  Clear liquids include: - water  - apple juice without pulp - gatorade (not RED colors) - black coffee or tea (Do NOT add milk or creamers to the coffee or tea) Do NOT drink anything that is not on this list.  In addition, your doctor has ordered for you to drink the provided:  Ensure Pre-Surgery Clear Carbohydrate Drink  Drinking this carbohydrate drink up to two hours before surgery helps to reduce insulin resistance and improve patient outcomes. Please complete drinking 2 hours before scheduled arrival time.  One week prior to surgery:Stop NOW (10-23-24) Stop Anti-inflammatories (NSAIDS) such as Advil , Aleve, Ibuprofen , Motrin , Naproxen, Naprosyn and Aspirin based products such as Excedrin, Goody's Powder, BC Powder.You may continue your celecoxib (CELEBREX) up until the night prior to surgery Stop ANY OVER THE COUNTER supplements until after surgery. (Vitamin D, B12, Folic Acid)  You may however, continue to take Tylenol  if needed for pain up until the day of surgery.  Stop metFORMIN (GLUCOPHAGE-XR) 2 days prior to surgery-Last  dose will be on 10-24-24 Tuesday  Continue taking all of your other prescription medications up until the day of surgery.  ON THE DAY OF SURGERY ONLY TAKE THESE MEDICATIONS WITH SIPS OF WATER: -You may take hydrOXYzine  (ATARAX ) if needed for anxiety  No Alcohol for 24 hours before or after surgery.  No Smoking including e-cigarettes for 24 hours before surgery.  No chewable tobacco products for at least 6 hours before surgery.  No nicotine patches on the day of surgery.  Do not use any recreational drugs for at least a week (preferably 2 weeks) before your surgery.  Please be advised that the combination of cocaine and anesthesia may have negative outcomes, up to and including death. If you test positive for cocaine, your surgery will be cancelled.  On the morning of surgery brush your teeth with toothpaste and water, you may rinse your mouth with mouthwash if you wish. Do not swallow any toothpaste or mouthwash.  Use CHG Soap as directed on instruction sheet.  Do not wear jewelry, make-up, hairpins, clips or nail polish.  For welded (permanent) jewelry: bracelets, anklets, waist bands, etc.  Please have this removed prior to surgery.  If it is not removed, there is a chance that hospital personnel will need to cut it off on the day of surgery.  Do not wear lotions, powders, or perfumes.   Do not shave body hair from the neck down 48 hours before surgery.  Contact lenses, hearing aids and dentures may not be worn into surgery.  Do not bring valuables to the hospital. [facility] is  not responsible for any missing/lost belongings or valuables.   Notify your doctor if there is any change in your medical condition (cold, fever, infection).  Wear comfortable clothing (specific to your surgery type) to the hospital.  After surgery, you can help prevent lung complications by doing breathing exercises.  Take deep breaths and cough every 1-2 hours. Your doctor may order a device  called an Incentive Spirometer to help you take deep breaths. When coughing or sneezing, hold a pillow firmly against your incision with both hands. This is called "splinting." Doing this helps protect your incision. It also decreases belly discomfort.  If you are being admitted to the hospital overnight, leave your suitcase in the car. After surgery it may be brought to your room.  In case of increased patient census, it may be necessary for you, the patient, to continue your postoperative care in the Same Day Surgery department.  If you are being discharged the day of surgery, you will not be allowed to drive home. You will need a responsible individual to drive you home and stay with you for 24 hours after surgery.   If you are taking public transportation, you will need to have a responsible individual with you.  Please call the Pre-admissions Testing Dept. at [phone] if you have any questions about these instructions.  Surgery Visitation Policy:  Patients having surgery or a procedure may have two visitors.  Children under the age of 42 must have an adult with them who is not the patient.                                                                                                             Preparing for Surgery with CHLORHEXIDINE  GLUCONATE (CHG) Soap  Chlorhexidine  Gluconate (CHG) Soap  o An antiseptic cleaner that kills germs and bonds with the skin to continue killing germs even after washing  o Used for showering the night before surgery and morning of surgery  Before surgery, you can play an important role by reducing the number of germs on your skin.  CHG (Chlorhexidine  gluconate) soap is an antiseptic cleanser which kills germs and bonds with the skin to continue killing germs even after washing.  Please do not use if you have an allergy to CHG or antibacterial soaps. If your skin becomes reddened/irritated stop using the CHG.  1. Shower the NIGHT BEFORE  SURGERY with CHG soap.  2. If you choose to wash your hair, wash your hair first as usual with your normal shampoo.  3. After shampooing, rinse your hair and body thoroughly to remove the shampoo.  4. Use CHG as you would any other liquid soap. You can apply CHG directly to the skin and wash gently with a clean washcloth.  5. Apply the CHG soap to your body only from the neck down. Do not use on open wounds or open sores. Avoid contact with your eyes, ears, mouth, and genitals (private parts). Wash face and genitals (private parts) with your normal soap.  6. Wash thoroughly,  paying special attention to the area where your surgery will be performed.  7. Thoroughly rinse your body with warm water.  8. Do not shower/wash with your normal soap after using and rinsing off the CHG soap.  9. Do not use lotions, oils, etc., after showering with CHG.  10. Pat yourself dry with a clean towel.  11. Wear clean pajamas to bed the night before surgery.  12. Place clean sheets on your bed the night of your shower and do not sleep with pets.  13. Do not apply any deodorants/lotions/powders.  14. Please wear clean clothes to the hospital.  15. Remember to brush your teeth with your regular toothpaste.   Merchandiser, Retail to address health-related social needs:  [email or web address]

## 2024-10-27 ENCOUNTER — Encounter: Payer: Self-pay | Admitting: Podiatry

## 2024-10-27 ENCOUNTER — Other Ambulatory Visit: Payer: Self-pay

## 2024-10-27 ENCOUNTER — Ambulatory Visit: Admitting: General Practice

## 2024-10-27 ENCOUNTER — Encounter: Admission: RE | Disposition: A | Payer: Self-pay | Source: Home / Self Care | Attending: Podiatry

## 2024-10-27 ENCOUNTER — Ambulatory Visit
Admission: RE | Admit: 2024-10-27 | Discharge: 2024-10-27 | Disposition: A | Discharge status: Home / Self Care | Attending: Podiatry | Admitting: Podiatry

## 2024-10-27 DIAGNOSIS — Z01818 Encounter for other preprocedural examination: Secondary | ICD-10-CM

## 2024-10-27 HISTORY — PX: GANGLION CYST EXCISION: SHX1691

## 2024-10-27 LAB — POCT PREGNANCY, URINE: Preg Test, Ur: NEGATIVE

## 2024-10-27 SURGERY — EXCISION, GANGLION CYST, FOOT
Anesthesia: General | Site: Foot | Laterality: Left

## 2024-10-27 MED ORDER — PROPOFOL 10 MG/ML IV BOLUS
INTRAVENOUS | Status: DC | PRN
Start: 2024-10-27 — End: 2024-10-27
  Administered 2024-10-27: 200 mg via INTRAVENOUS

## 2024-10-27 MED ORDER — FENTANYL CITRATE (PF) 100 MCG/2ML IJ SOLN
INTRAMUSCULAR | Status: AC
  Filled 2024-10-27: qty 2

## 2024-10-27 MED ORDER — PHENYLEPHRINE 80 MCG/ML (10ML) SYRINGE FOR IV PUSH (FOR BLOOD PRESSURE SUPPORT)
PREFILLED_SYRINGE | INTRAVENOUS | Status: AC
  Filled 2024-10-27: qty 10

## 2024-10-27 MED ORDER — OXYCODONE HCL 5 MG PO TABS
5.0000 mg | ORAL_TABLET | Freq: Once | ORAL | Status: AC | PRN
  Administered 2024-10-27: 5 mg via ORAL

## 2024-10-27 MED ORDER — LIDOCAINE HCL (PF) 2 % IJ SOLN
INTRAMUSCULAR | Status: AC
  Filled 2024-10-27: qty 5

## 2024-10-27 MED ORDER — ACETAMINOPHEN 10 MG/ML IV SOLN
INTRAVENOUS | Status: AC
  Filled 2024-10-27: qty 100

## 2024-10-27 MED ORDER — ONDANSETRON HCL 4 MG/2ML IJ SOLN
INTRAMUSCULAR | Status: AC
  Filled 2024-10-27: qty 2

## 2024-10-27 MED ORDER — LIDOCAINE HCL (PF) 1 % IJ SOLN
INTRAMUSCULAR | Status: AC
  Filled 2024-10-27: qty 30

## 2024-10-27 MED ORDER — BUPIVACAINE HCL (PF) 0.5 % IJ SOLN
INTRAMUSCULAR | Status: AC
  Filled 2024-10-27: qty 30

## 2024-10-27 MED ORDER — LIDOCAINE-EPINEPHRINE 1 %-1:100000 IJ SOLN
INTRAMUSCULAR | Status: DC | PRN
  Administered 2024-10-27: 10 mL

## 2024-10-27 MED ORDER — FENTANYL CITRATE (PF) 100 MCG/2ML IJ SOLN
INTRAMUSCULAR | Status: DC | PRN
  Administered 2024-10-27 (×2): 50 ug via INTRAVENOUS

## 2024-10-27 MED ORDER — EPHEDRINE SULFATE-NACL 50-0.9 MG/10ML-% IV SOSY
PREFILLED_SYRINGE | INTRAVENOUS | Status: DC | PRN
  Administered 2024-10-27: 10 mg via INTRAVENOUS

## 2024-10-27 MED ORDER — CHLORHEXIDINE GLUCONATE 0.12 % MT SOLN
15.0000 mL | Freq: Once | OROMUCOSAL | Status: AC
  Administered 2024-10-27: 15 mL via OROMUCOSAL

## 2024-10-27 MED ORDER — FENTANYL CITRATE (PF) 100 MCG/2ML IJ SOLN
25.0000 ug | INTRAMUSCULAR | Status: DC | PRN
  Administered 2024-10-27 (×2): 25 ug via INTRAVENOUS

## 2024-10-27 MED ORDER — METOCLOPRAMIDE HCL 10 MG PO TABS: 5.0000 mg | ORAL_TABLET | Freq: Three times a day (TID) | ORAL | Status: DC | PRN

## 2024-10-27 MED ORDER — PHENYLEPHRINE 80 MCG/ML (10ML) SYRINGE FOR IV PUSH (FOR BLOOD PRESSURE SUPPORT)
PREFILLED_SYRINGE | INTRAVENOUS | Status: DC | PRN
  Administered 2024-10-27 (×3): 80 ug via INTRAVENOUS

## 2024-10-27 MED ORDER — ONDANSETRON HCL 4 MG/2ML IJ SOLN
INTRAMUSCULAR | Status: DC | PRN
  Administered 2024-10-27: 4 mg via INTRAVENOUS

## 2024-10-27 MED ORDER — ACETAMINOPHEN 10 MG/ML IV SOLN
INTRAVENOUS | Status: DC | PRN
Start: 2024-10-27 — End: 2024-10-27
  Administered 2024-10-27: 1000 mg via INTRAVENOUS

## 2024-10-27 MED ORDER — EPHEDRINE 5 MG/ML INJ
INTRAVENOUS | Status: AC
  Filled 2024-10-27: qty 5

## 2024-10-27 MED ORDER — LIDOCAINE HCL (CARDIAC) PF 100 MG/5ML IV SOSY
PREFILLED_SYRINGE | INTRAVENOUS | Status: DC | PRN
  Administered 2024-10-27: 100 mg via INTRAVENOUS

## 2024-10-27 MED ORDER — ONDANSETRON HCL 4 MG PO TABS
4.0000 mg | ORAL_TABLET | Freq: Four times a day (QID) | ORAL | Status: DC | PRN
Start: 2024-10-27 — End: 2024-10-27

## 2024-10-27 MED ORDER — CHLORHEXIDINE GLUCONATE 0.12 % MT SOLN
OROMUCOSAL | Status: AC
  Filled 2024-10-27: qty 15

## 2024-10-27 MED ORDER — ONDANSETRON HCL 4 MG/2ML IJ SOLN
4.0000 mg | Freq: Four times a day (QID) | INTRAMUSCULAR | Status: DC | PRN
Start: 2024-10-27 — End: 2024-10-27

## 2024-10-27 MED ORDER — SUCCINYLCHOLINE CHLORIDE 200 MG/10ML IV SOSY
PREFILLED_SYRINGE | INTRAVENOUS | Status: AC
  Filled 2024-10-27: qty 10

## 2024-10-27 MED ORDER — OXYCODONE HCL 5 MG PO TABS
ORAL_TABLET | ORAL | Status: AC
  Filled 2024-10-27: qty 1

## 2024-10-27 MED ORDER — OXYCODONE HCL 5 MG/5ML PO SOLN: 5.0000 mg | Freq: Once | ORAL | Status: AC | PRN

## 2024-10-27 MED ORDER — PROPOFOL 10 MG/ML IV BOLUS
INTRAVENOUS | Status: AC
  Filled 2024-10-27: qty 20

## 2024-10-27 MED ORDER — LIDOCAINE-EPINEPHRINE 1 %-1:100000 IJ SOLN
INTRAMUSCULAR | Status: AC
  Filled 2024-10-27: qty 1

## 2024-10-27 MED ORDER — CEFAZOLIN SODIUM-DEXTROSE 3-4 GM/150ML-% IV SOLN
3.0000 g | INTRAVENOUS | Status: AC
  Administered 2024-10-27: 3 g via INTRAVENOUS
  Filled 2024-10-27: qty 150

## 2024-10-27 MED ORDER — MIDAZOLAM HCL (PF) 2 MG/2ML IJ SOLN
INTRAMUSCULAR | Status: DC | PRN
  Administered 2024-10-27: 2 mg via INTRAVENOUS

## 2024-10-27 MED ORDER — SUCCINYLCHOLINE CHLORIDE 200 MG/10ML IV SOSY
PREFILLED_SYRINGE | INTRAVENOUS | Status: DC | PRN
Start: 2024-10-27 — End: 2024-10-27
  Administered 2024-10-27: 140 mg via INTRAVENOUS

## 2024-10-27 MED ORDER — LACTATED RINGERS IV SOLN: INTRAVENOUS | Status: DC

## 2024-10-27 MED ORDER — DEXAMETHASONE SOD PHOSPHATE PF 10 MG/ML IJ SOLN
INTRAMUSCULAR | Status: DC | PRN
  Administered 2024-10-27: 10 mg via INTRAVENOUS

## 2024-10-27 MED ORDER — MIDAZOLAM HCL 2 MG/2ML IJ SOLN
INTRAMUSCULAR | Status: AC
  Filled 2024-10-27: qty 2

## 2024-10-27 MED ORDER — METOCLOPRAMIDE HCL 5 MG/ML IJ SOLN: 5.0000 mg | Freq: Three times a day (TID) | INTRAMUSCULAR | Status: DC | PRN

## 2024-10-27 MED ORDER — ORAL CARE MOUTH RINSE: 15.0000 mL | Freq: Once | OROMUCOSAL | Status: AC

## 2024-10-27 SURGICAL SUPPLY — 32 items
BENZOIN TINCTURE PRP APPL 2/3 (GAUZE/BANDAGES/DRESSINGS) ×1 IMPLANT
BNDG ELASTIC 4X5.8 VLCR NS LF (GAUZE/BANDAGES/DRESSINGS) ×1 IMPLANT
BNDG ESMARCH 4X12 STRL LF (GAUZE/BANDAGES/DRESSINGS) ×1 IMPLANT
BNDG GAUZE DERMACEA FLUFF 4 (GAUZE/BANDAGES/DRESSINGS) ×1 IMPLANT
BNDG STRETCH GAUZE 3IN X12FT (GAUZE/BANDAGES/DRESSINGS) ×1 IMPLANT
CUFF TOURN SGL QUICK 12 (TOURNIQUET CUFF) IMPLANT
CUFF TOURN SGL QUICK 18X4 (TOURNIQUET CUFF) IMPLANT
DURAPREP 26ML APPLICATOR (WOUND CARE) ×1 IMPLANT
ELECTRODE REM PT RTRN 9FT ADLT (ELECTROSURGICAL) ×1 IMPLANT
GAUZE SPONGE 4X4 12PLY STRL (GAUZE/BANDAGES/DRESSINGS) ×1 IMPLANT
GAUZE STRETCH 2X75IN STRL (MISCELLANEOUS) ×1 IMPLANT
GAUZE XEROFORM 1X8 LF (GAUZE/BANDAGES/DRESSINGS) ×1 IMPLANT
GLOVE BIO SURGEON STRL SZ7.5 (GLOVE) ×1 IMPLANT
GLOVE BIOGEL PI IND STRL 8 (GLOVE) ×1 IMPLANT
GOWN STRL REUS W/ TWL XL LVL3 (GOWN DISPOSABLE) ×2 IMPLANT
LABEL OR SOLS (LABEL) ×1 IMPLANT
MANIFOLD NEPTUNE II (INSTRUMENTS) ×1 IMPLANT
NDL FILTER BLUNT 18X1 1/2 (NEEDLE) ×1 IMPLANT
NDL HYPO 25X1 1.5 SAFETY (NEEDLE) ×2 IMPLANT
NEEDLE FILTER BLUNT 18X1 1/2 (NEEDLE) ×1 IMPLANT
NEEDLE HYPO 25X1 1.5 SAFETY (NEEDLE) ×2 IMPLANT
NS IRRIG 500ML POUR BTL (IV SOLUTION) ×1 IMPLANT
PACK EXTREMITY ARMC (MISCELLANEOUS) ×1 IMPLANT
PENCIL SMOKE EVACUATOR (MISCELLANEOUS) ×1 IMPLANT
STOCKINETTE M/LG 89821 (MISCELLANEOUS) ×1 IMPLANT
STRIP CLOSURE SKIN 1/4X4 (GAUZE/BANDAGES/DRESSINGS) ×1 IMPLANT
SUT NYLON 3 0 (SUTURE) IMPLANT
SUT VIC AB 3-0 SH 27X BRD (SUTURE) ×1 IMPLANT
SUTURE MNCRL 4-0 27XMF (SUTURE) ×1 IMPLANT
SYR 10ML LL (SYRINGE) ×1 IMPLANT
TRAP FLUID SMOKE EVACUATOR (MISCELLANEOUS) ×1 IMPLANT
WATER STERILE IRR 500ML POUR (IV SOLUTION) ×1 IMPLANT

## 2024-10-27 NOTE — Transfer of Care (Signed)
 Immediate Anesthesia Transfer of Care Note  Patient: Gina Farmer  Procedure(s) Performed: EXCISION, GANGLION CYST, FOOT (Left: Foot)  Patient Location: PACU  Anesthesia Type:General  Level of Consciousness: awake, alert , and oriented  Airway & Oxygen Therapy: Patient Spontanous Breathing and Patient connected to face mask oxygen  Post-op Assessment: Report given to RN and Post -op Vital signs reviewed and stable  Post vital signs: Reviewed and stable  Last Vitals:  Vitals Value Taken Time  BP 136/82 10/27/24 13:00  Temp 36.2 C 10/27/24 13:00  Pulse 89 10/27/24 13:02  Resp 38 10/27/24 13:02  SpO2 98 % 10/27/24 13:02  Vitals shown include unfiled device data.  Last Pain:  Vitals:   10/27/24 1300  TempSrc:   PainSc: 0-No pain         Complications: No notable events documented.

## 2024-10-27 NOTE — H&P (Signed)
 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  The various methods of treatment have been discussed with the patient.  No guarantees were given.  After consideration of risks, benefits and other options for treatment, the patient has consented to surgery.  I have reviewed the patients' chart and labs.     A history and physical examination was performed in my office.  The patient was reexamined.  There have been no changes to this history and physical examination.  Ashley Soulier A

## 2024-10-27 NOTE — Anesthesia Procedure Notes (Signed)
 Procedure Name: Intubation Date/Time: 10/27/2024 12:17 PM  Performed by: Jackye Spanner, CRNAPre-anesthesia Checklist: Patient identified, Patient being monitored, Timeout performed, Emergency Drugs available and Suction available Patient Re-evaluated:Patient Re-evaluated prior to induction Oxygen Delivery Method: Circle system utilized Preoxygenation: Pre-oxygenation with 100% oxygen Induction Type: IV induction Ventilation: Mask ventilation without difficulty and Two handed mask ventilation required Laryngoscope Size: 3 and McGrath Grade View: Grade I Tube type: Oral Tube size: 7.0 mm Number of attempts: 1 Airway Equipment and Method: Stylet Placement Confirmation: ETT inserted through vocal cords under direct vision, positive ETCO2 and breath sounds checked- equal and bilateral Secured at: 22 cm Tube secured with: Tape Dental Injury: Teeth and Oropharynx as per pre-operative assessment  Comments: Smooth atraumatic intubation, no complications noted.

## 2024-10-27 NOTE — Discharge Instructions (Signed)

## 2024-10-27 NOTE — Op Note (Signed)
 Operative note   Surgeon:Miel Wisener [employer]/support/administrative Officer: None    Preop diagnosis: Soft tissue mass left foot    Postop diagnosis: Same    Procedure: Excision soft tissue mass dorsal lateral left foot    EBL: Minimal    Anesthesia: General With local.  Local consist of a total of 10 cc of one-to-one mixture of 1% lidocaine  with epinephrine  and 0.5% plain bupivacaine     Hemostasis: Lidocaine  with epinephrine     Specimen: Soft tissue mass left foot likely lipoma    Complications: None    Operative indications:Natalyah D Manon is an 38 y.o. that presents today for surgical intervention.  The risks/benefits/alternatives/complications have been discussed and consent has been given.    Procedure:  Patient was brought into the OR and placed on the operating table in thesupine position. After anesthesia was obtained theleft lower extremity was prepped and draped in usual sterile fashion.  Attention was directed to the dorsal lateral aspect of the left foot where an incision was made overlying approximate the level of the 4th and 5th met cuboid region.  Sharp and blunt dissection carried down to the superficial fascia into the subcutaneous tissue.  At this time a well-circumscribed soft tissue mass consistent with lipoma was noted.  This was approximately 1-1/2 cm in diameter and depth.  Full-thickness excision was then performed down to the deep fascial layer.  This was sent for pathological examination.  Wound was then flushed with copious amounts of irrigation.  Bleeders were Bovie cauterized as appropriate.  Closure was then performed with a 4-0 Vicryl subcutaneous tissue and a 3-0 nylon for the skin.    Patient tolerated the procedure and anesthesia well.  Was transported from the OR to the PACU with all vital signs stable and vascular status intact. To be discharged per routine protocol.  Will follow up in approximately 1 week in the outpatient clinic.

## 2024-10-27 NOTE — Anesthesia Preprocedure Evaluation (Signed)
 Anesthesia Evaluation  Patient identified by MRN, date of birth, ID band Patient awake    Reviewed: Allergy & Precautions, NPO status , Patient's Chart, lab work & pertinent test results  History of Anesthesia Complications (+) history of anesthetic complications  Airway Mallampati: IV  TM Distance: <3 FB Neck ROM: full    Dental  (+) Chipped, Poor Dentition, Loose   Pulmonary shortness of breath and with exertion, asthma , sleep apnea and Continuous Positive Airway Pressure Ventilation , Current Smoker and Patient abstained from smoking.   Pulmonary exam normal        Cardiovascular Exercise Tolerance: Good (-) angina (-) Past MI Normal cardiovascular exam     Neuro/Psych  Headaches PSYCHIATRIC DISORDERS Anxiety Depression Bipolar Disorder      GI/Hepatic Neg liver ROS,GERD  Controlled,,  Endo/Other    Class 4 obesity  Renal/GU      Musculoskeletal   Abdominal   Peds  Hematology negative hematology ROS (+)   Anesthesia Other Findings Past Medical History: No date: Anxiety No date: Back injury No date: Bipolar 1 disorder (HCC) No date: Bipolar 2 disorder (HCC) No date: Complication of anesthesia     Comment:  stop breathing during surgery - when ovaries were being               removed, suspects sleep apnea 2023: Deep vein thrombosis (DVT) of left lower extremity (HCC) No date: Depression No date: Dyspnea No date: Family history of ovarian cancer     Comment:  2/21 cancer genetic testing letter sent No date: GERD (gastroesophageal reflux disease) No date: Headache No date: History of heart murmur in childhood No date: Increased pressure in the eye, left No date: Morbid obesity (HCC) No date: Post traumatic stress disorder (PTSD) No date: Postmenopausal bleeding No date: Pre-diabetes 2023: Pulmonary embolism [facility])  Past Surgical History: 2009: APPENDECTOMY No date: INTRAOCULAR LENS IMPLANT, SECONDARY;  Left     Comment:  from eye injury 07/25/2020: LAPAROSCOPIC BILATERAL SALPINGO OOPHERECTOMY; Bilateral     Comment:  Procedure: LAPAROSCOPIC BILATERAL SALPINGO OOPHORECTOMY;              Surgeon: Lake Read, MD;  Location: [facility] ORS;                Service: Gynecology;  Laterality: Bilateral; 2003: LAPAROSCOPY 05/21/2020: LAPAROSCOPY; N/A     Comment:  Procedure: LAPAROSCOPY DIAGNOSTIC;  Surgeon: Lake Read, MD;  Location: [facility] ORS;  Service: Gynecology;                Laterality: N/A; No date: SACROILIAC JOINT INJECTION     Reproductive/Obstetrics negative OB ROS                              Anesthesia Physical Anesthesia Plan  ASA: 3  Anesthesia Plan: General ETT   Post-op Pain Management:    Induction: Intravenous  PONV Risk Score and Plan: 3 and Midazolam , Ondansetron  and Dexamethasone   Airway Management Planned: Oral ETT  Additional Equipment:   Intra-op Plan:   Post-operative Plan: Extubation in OR  Informed Consent: I have reviewed the patients History and Physical, chart, labs and discussed the procedure including the risks, benefits and alternatives for the proposed anesthesia with the patient or authorized representative who has indicated his/her understanding and acceptance.     Dental Advisory Given  Plan Discussed with:  Anesthesiologist, CRNA and Surgeon  Anesthesia Plan Comments: (Patient consented for risks of anesthesia including but not limited to:  - adverse reactions to medications - damage to eyes, teeth, lips or other oral mucosa - nerve damage due to positioning  - sore throat or hoarseness - Damage to heart, brain, nerves, lungs, other parts of body or loss of life  Patient voiced understanding and assent.)        Anesthesia Quick Evaluation

## 2024-10-28 NOTE — Anesthesia Postprocedure Evaluation (Signed)
 Anesthesia Post Note  Patient: Gina Farmer  Procedure(s) Performed: EXCISION, GANGLION CYST, FOOT (Left: Foot)  Patient location during evaluation: PACU Anesthesia Type: General Level of consciousness: awake and alert Pain management: pain level controlled Vital Signs Assessment: post-procedure vital signs reviewed and stable Respiratory status: spontaneous breathing, nonlabored ventilation, respiratory function stable and patient connected to nasal cannula oxygen Cardiovascular status: blood pressure returned to baseline and stable Postop Assessment: no apparent nausea or vomiting Anesthetic complications: no   No notable events documented.   Last Vitals:  Vitals:   10/27/24 1345 10/27/24 1401  BP: 130/83 (!) 147/93  Pulse: 79 81  Resp: 18 17  Temp: 36.7 C (!) 36.3 C  SpO2: 97% 98%    Last Pain:  Vitals:   10/27/24 1401  TempSrc: Temporal  PainSc: 0-No pain                 Debby Mines

## 2024-10-29 ENCOUNTER — Encounter: Payer: Self-pay | Admitting: Podiatry

## 2024-10-31 LAB — SURGICAL PATHOLOGY

## 2024-11-08 NOTE — Progress Notes (Signed)
 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 She was last seen 09/27/2024 and was doing poorly. We proceeded with methotrexate.  She was diagnosed with inflammatory arthritis in 2022 by Dr. Lady Blanch.   Today She is doing very poorly, noting worsened pains in her left foot since a lipoma excision on 11/14. She notes due to surgery postponements and confusion on her part she ended up taking methotrexate 2.5mg  weekly twice, then discontinued it with preoperative hold and has not yet resumed it. She sees Dr. Ashley later today for wound examination and suture removal.  Gina Farmer has tried the following rheumatologic medications in the past: - Plaquenil - helped some - Methotrexate - helped more than plaquenil   Oluwadarasimi's work status is out of work from [employer] due to her symptoms. She is a light smoker who smokes 0.05 ppd for 20 years, for a total of 1 pack years. She currently vapes and smokes very little. She drinks an average of 0-2 servings of alcohol weekly. Lenor reports a family history of psoriasis in her great aunt, RA in one cousin and great aunt and grandmother, some of colitis in her grandmother, fibromyalgia in her aunt and great aunt, but denies any family history of known autoimmune disease otherwise. She is utilizing bilateral salpingoophrectomy for contraception.  Review of Systems ROS was negative except as noted above.  Patient Active Problem List   Diagnosis Date Noted   Fracture of lateral malleolus 10/10/2024   PTSD (post-traumatic stress disorder) 08/29/2024   OSA (obstructive sleep apnea) 08/07/2024   Anxiety disorder, unspecified 07/07/2024   Borderline personality disorder (CMS/HHS-HCC) 07/07/2024   Sensory neuropathy 07/07/2024   Closed wedge  compression fracture of T11 vertebra with routine healing 07/07/2024   Closed wedge compression fracture of T12 vertebra with routine healing 07/07/2024   Morbid obesity with BMI of 60.0-69.9, adult (CMS-HCC) 06/29/2024   Vitamin D deficiency 05/08/2024   Vitamin B 12 deficiency 05/08/2024   Prediabetes 05/08/2024   Essential hypertension 05/05/2024   Cardiomegaly 06/04/2022   History of pulmonary embolism 01/20/2022   Acute pulmonary embolism (CMS/HHS-HCC) 01/16/2022   DVT (deep venous thrombosis) (CMS/HHS-HCC) 01/16/2022   Left leg pain 01/16/2022   Contusion of left hand 10/08/2021   Undifferentiated inflammatory arthritis 06/02/2021   Pain in finger of right hand 10/18/2020   Pain of left hand 10/08/2020   Endometriosis determined by laparoscopy 05/31/2020   Trichomoniasis 12/12/2019   Medical abortion 01/05/2017   Gastroesophageal reflux disease without esophagitis 11/12/2014   Chronic pain 06/12/2014   Lumbago 06/12/2014   Bipolar disorder (CMS/HHS-HCC) 06/12/2014    Past Medical History:  Diagnosis Date   Abnormal uterine bleeding    Anxiety    Arthritis 06/19/13   lower back   Bipolar 1 disorder (CMS/HHS-HCC)    Bipolar disorder (CMS/HHS-HCC)    Chickenpox    Chronic diarrhea    Clotting disorder (HHS-HCC)    Depression    DVT (deep venous thrombosis) (CMS/HHS-HCC) 01/2022   Femoral DVT and PE   Dysmenorrhea    Endometriosis    Endometriosis of uterus    Bilateral salpingo oopharectomy   Essential hypertension 05/05/2024   Food intolerance tomatoes, lactose causes diarrhea   GERD (gastroesophageal reflux disease) sometimes   Heart murmur 1987   History of abnormal  cervical Pap smear    Leep procedure [facility]   Irritable bowel syndrome 11/2014   Migraines    Osteoarthritis 2015   Osteoporosis    Pulmonary emboli (CMS/HHS-HCC) 01/2022   Rheumatoid arthritis (CMS/HHS-HCC) 2022   Sleep apnea    Past Surgical  History:  Procedure Laterality Date   Diagnostic laparoscopy  2001   PELVIC LAPAROSCOPY  2003   adhesions   COLONOSCOPY  11/15/2014   Normal Biopsies - no repeat per Dr. Jeri   ovaries removed  07/2020   APPENDECTOMY  2008   [facility]   CATARACT EXTRACTION     left eye intraocular lense implant- no cataract   EYE SURGERY  1989, 2004   Lensectomy (L), Intraocular lense implant (L)   LENS EYE SURGERY  1989, 2004, 2005   lens removed, then lens replaced   Social History   Socioeconomic History   Marital status: Single   Number of children: 0   Highest education level: Associate degree: occupational, scientist, product/process development, or vocational program  Occupational History   Occupation: working  Tobacco Use   Smoking status: Former    Current packs/day: 0.00    Average packs/day: 0.3 packs/day for 20.0 years (5.0 ttl pk-yrs)    Types: Cigarettes    Start date: 06/12/2004    Quit date: 06/01/2024    Years since quitting: 0.4   Smokeless tobacco: Never  Vaping Use   Vaping status: Former  Substance and Sexual Activity   Alcohol use: Yes    Alcohol/week: 1.0 standard drink of alcohol    Types: 1 Shots of liquor per week    Comment: socially   Drug use: Not Currently    Comment: Weed-  quit 10/2023   Sexual activity: Not Currently    Partners: Male    Birth control/protection: Surgical   Social Drivers of Health   Financial Resource Strain: High Risk (09/08/2024)   Overall Financial Resource Strain (CARDIA)    Difficulty of Paying Living Expenses: Very hard  Food Insecurity: Food Insecurity Present (09/08/2024)   Hunger Vital Sign    Worried About Running Out of Food in the Last Year: Sometimes true    Ran Out of Food in the Last Year: Sometimes true  Transportation Needs: Unmet Transportation Needs (09/08/2024)   PRAPARE - Transportation    Lack of Transportation (Medical): No    Lack of Transportation (Non-Medical): Yes  Physical Activity: Inactive (09/08/2024)    Exercise Vital Sign    Days of Exercise per Week: 0 days    Minutes of Exercise per Session: 0 min  Stress: Stress Concern Present (09/08/2024)   [employer] of Occupational [employer] - Occupational Stress Questionnaire    Feeling of Stress : Rather much  Social Connections: Socially Isolated (09/08/2024)   Social Connection and Isolation Panel    Frequency of Communication with Friends and Family: Twice a week    Frequency of Social Gatherings with Friends and Family: Never    Attends Religious Services: Never    Database Administrator or Organizations: No    Attends Banker Meetings: Never    Marital Status: Never married  Housing Stability: High Risk (09/08/2024)   Housing Stability Vital Sign    Unable to Pay for Housing in the Last Year: No    Number of Times Moved in the Last Year: 6    Homeless in the Last Year: Yes   Family History  Problem Relation Name Age of Onset   Hyperlipidemia (Elevated cholesterol)  Mother     Mental illness Mother     Myocardial Infarction (Heart attack) Father Victory    Sleep apnea Father Victory    Heart disease Father Victory        deceased heart attack   Hyperlipidemia (Elevated cholesterol) Father Victory    High blood pressure (Hypertension) Father Victory    Obesity Father Victory    Depression Brother     High blood pressure (Hypertension) Brother     Fatty liver disease Brother     ADD / ADHD Brother     Alcohol abuse Brother Justin    High blood pressure (Hypertension) Brother Charleston        On 4 meds   Alcohol abuse Brother Siniya Lichty        Alcoholism, drugs- steroid abuse   Depression Maternal Aunt Arland    Rheum arthritis Maternal Aunt Arland    Obesity Maternal Aunt Arland    Obesity Paternal Aunt Olam    Obesity Paternal Wylie Olam    Diabetes type II Maternal Grandmother Margaret    Arthritis Maternal Grandmother Margaret    Gout Maternal Grandmother Margaret    Osteoarthritis  Maternal Grandmother Margaret    Rheum arthritis Maternal Grandmother Margaret    Myocardial Infarction (Heart attack) Maternal Grandfather N/a    Heart disease Maternal Grandfather N/a    Stroke Maternal Grandfather N/a    Gout Maternal Grandfather N/a    Gout Paternal Grandmother Matsuo    Obesity Paternal Grandmother Kundert    Asthma Paternal Grandfather     Gout Paternal Grandfather     Colon cancer Neg Hx     Colon polyps Neg Hx      Physical Exam:  BP (!) 119/90   Pulse 79   Temp 36.5 C (97.7 F)   Ht 154.9 cm (5' 1)   Wt (!) 153.8 kg (339 lb)   BMI 64.05 kg/m   General: White woman sitting in chair. Well groomed, no acute distress. Non-toxic appearance.  HEENT: Conjunctivae normal.  Pulmonary: Normal effort of breathing. Symmetric chest expansion.  Musculoskeletal: Tenderness to palpation of scattered MCPs and PIPs with S1 synovitis noted to right 5th PIP.  Fist formation minimally impaired bilaterally. No other tender, synovitic, warm, erythematous, or deformed joints. FROM all joints except as above.  Skin: Skin warm and dry. No rashes appreciable. Neurologic: Oriented to time, person, place, and situation.  Psychological: Normal behavior, thought content, and judgment  Records were reviewed. Office Visit on 10/13/2024  Component Date Value Ref Range Status   Sodium 10/13/2024 140  135 - 145 mmol/L Final   Potassium 10/13/2024 4.0  3.5 - 5.0 mmol/L Final   Chloride 10/13/2024 103  98 - 108 mmol/L Final   Carbon Dioxide (CO2) 10/13/2024 27  21 - 30 mmol/L Final   Urea Nitrogen (BUN) 10/13/2024 8  7 - 20 mg/dL Final   Creatinine 89/68/7974 0.8  0.4 - 1.0 mg/dL Final   Glucose 89/68/7974 114  70 - 140 mg/dL Final   Calcium 89/68/7974 8.9  8.7 - 10.2 mg/dL Final   AST (Aspartate Aminotransferase) 10/13/2024 22  15 - 41 U/L Final   ALT (Alanine Aminotransferase) 10/13/2024 19  10 - 39 U/L Final   Bilirubin, Total 10/13/2024 0.5  0.4 - 1.5 mg/dL  Final   Alk Phos (Alkaline Phosphatase) 10/13/2024 70  24 - 110 U/L Final   Albumin 10/13/2024 3.4 (L)  3.5 - 4.8 g/dL Final   Protein, Total 10/13/2024 6.3  6.2 - 8.1 g/dL Final   Anion Gap 89/68/7974 10  3 - 12 mmol/L Final   BUN/CREA Ratio 10/13/2024 10  6 - 27 Final   Glomerular Filtration Rate (eGFR)  10/13/2024 97  mL/min/1.73sq m Final   WBC (White Blood Cell Count) 10/13/2024 9.5  3.2 - 9.8 x109/L Final   Hemoglobin 10/13/2024 13.8  11.7 - 15.5 g/dL Final   Hematocrit 89/68/7974 44.6  35.0 - 45.0 % Final   Platelets 10/13/2024 338  150 - 450 x109/L Final   MCV (Mean Corpuscular Volume) 10/13/2024 91  80 - 98 fL Final   MCH (Mean Corpuscular Hemoglobin) 10/13/2024 28.0  26.5 - 34.0 pg Final   MCHC (Mean Corpuscular Hemoglobin * 10/13/2024 30.9 (L)  31.0 - 36.0 % Final   RBC (Red Blood Cell Count) 10/13/2024 4.93  3.77 - 5.16 x1012/L Final   RDW-CV (Red Cell Distribution Widt* 10/13/2024 13.0  11.5 - 14.5 % Final   NRBC (Nucleated Red Blood Cell Cou* 10/13/2024 0.00  0 x109/L Final   NRBC % (Nucleated Red Blood Cell %) 10/13/2024 0.0  % Final   MPV (Mean Platelet Volume) 10/13/2024 10.5  7.2 - 11.7 fL Final   Neutrophil Count 10/13/2024 5.6  2.0 - 8.6 x109/L Final   Neutrophil % 10/13/2024 59.3  37 - 80 % Final   Lymphocyte Count 10/13/2024 2.8  0.6 - 4.2 x109/L Final   Lymphocyte % 10/13/2024 29.8  10 - 50 % Final   Monocyte Count 10/13/2024 0.6  0 - 0.9 x109/L Final   Monocyte % 10/13/2024 6.4  0 - 12 % Final   Eosinophil Count 10/13/2024 0.32  0 - 0.70 x109/L Final   Eosinophil % 10/13/2024 3.4  0 - 7 % Final   Basophil Count 10/13/2024 0.06  0 - 0.20 x109/L Final   Basophil % 10/13/2024 0.6  0 - 2 % Final   Immature Granulocyte Count 10/13/2024 0.05  <=0.06 x109/L Final   Immature Granulocyte % 10/13/2024 0.5  <=0.7 % Final   Hemoglobin A1C 10/13/2024 6.0 (H)  <5.7 % Final   Average Blood Glucose (Calculated * 10/13/2024 124  mg/dL  Final   Prothrombin Time 10/13/2024 10.3  9.5 - 13.1 sec Final   Prothrombin INR 10/13/2024 0.9  0.9 - 1.1 Final  Office Visit on 09/27/2024  Component Date Value Ref Range Status   WBC (White Blood Cell Count) 09/27/2024 12.2 (H)  4.1 - 10.2 103/uL Final   RBC (Red Blood Cell Count) 09/27/2024 4.90  4.04 - 5.48 106/uL Final   Hemoglobin 09/27/2024 14.2  12.0 - 15.0 gm/dL Final   Hematocrit 89/84/7974 43.2  35.0 - 47.0 % Final   MCV (Mean Corpuscular Volume) 09/27/2024 88.2  80.0 - 100.0 fl Final   MCH (Mean Corpuscular Hemoglobin) 09/27/2024 29.0  27.0 - 31.2 pg Final   MCHC (Mean Corpuscular Hemoglobin * 09/27/2024 32.9  32.0 - 36.0 gm/dL Final   Platelet Count 09/27/2024 316  150 - 450 103/uL Final   RDW-CV (Red Cell Distribution Widt* 09/27/2024 12.9  11.6 - 14.8 % Final   MPV (Mean Platelet Volume) 09/27/2024 10.0  9.4 - 12.4 fl Final   Neutrophils 09/27/2024 7.19  1.50 - 7.80 103/uL Final   Lymphocytes 09/27/2024 3.82 (H)  1.00 - 3.60 103/uL Final   Monocytes 09/27/2024 0.71  0.00 - 1.50 103/uL Final   Eosinophils 09/27/2024 0.35  0.00 - 0.55 103/uL Final   Basophils 09/27/2024 0.08  0.00 - 0.09 103/uL Final  Neutrophil % 09/27/2024 59.0  32.0 - 70.0 % Final   Lymphocyte % 09/27/2024 31.3  10.0 - 50.0 % Final   Monocyte % 09/27/2024 5.8  4.0 - 13.0 % Final   Eosinophil % 09/27/2024 2.9  1.0 - 5.0 % Final   Basophil% 09/27/2024 0.7  0.0 - 2.0 % Final   Immature Granulocyte % 09/27/2024 0.3  <=0.7 % Final   Immature Granulocyte Count 09/27/2024 0.04  <=0.06 10^3/L Final   Protein, Total 09/27/2024 6.4  6.1 - 7.9 g/dL Final   Albumin 89/84/7974 4.2  3.5 - 4.8 g/dL Final   Bilirubin, Total 09/27/2024 0.3  0.3 - 1.2 mg/dL Final   Bilirubin, Conjugated 09/27/2024 0.05  0.00 - 0.20 mg/dL Final   Alk Phos (alkaline Phosphatase) 09/27/2024 86  34 - 104 U/L Final   AST  09/27/2024 15  8 - 39 U/L Final   ALT  09/27/2024 15  5 - 38 U/L Final    Creatinine 09/27/2024 0.9  0.6 - 1.1 mg/dL Final   Glomerular Filtration Rate (eGFR) 09/27/2024 84  >60 mL/min/1.73sq m Final   C Reactive Protein - [facility] 09/27/2024 11 (H)  0 - 10 mg/L Final   Sed Rate - [facility] 09/27/2024 49 (H)  0 - 32 mm/hr Final   RA Latex Turbid. - [facility] 09/27/2024 <10.0  <14.0 IU/mL Final   CCP Antibodies IgG/IgA - [facility] 09/27/2024 7  0 - 19 units Final   Anti-MCV antibody - [facility] 09/27/2024 <20  <20 U/mL Final   Anti-CEP-1 Ab, IgG (RDL) - [facility] 09/27/2024 <79  <20 Units Final   Anti-Sa Ab, IgG (RDL) - [facility] 09/27/2024 <79  <20 Units Final   Anti-CarP Ab - [facility] 09/27/2024 <20  <20 Units Final   Antinuclear Antibodies, IFA - [facility] 09/27/2024 Negative   Final   Color 09/27/2024 Light Yellow  Colorless, Straw, Light Yellow, Yellow, Dark Yellow Final   Clarity 09/27/2024 Clear  Clear Final   Specific Gravity 09/27/2024 1.027  1.005 - 1.030 Final   pH, Urine 09/27/2024 5.5  5.0 - 8.0 Final   Protein, Urinalysis 09/27/2024 Negative  Negative mg/dL Final   Glucose, Urinalysis 09/27/2024 Negative  Negative mg/dL Final   Ketones, Urinalysis 09/27/2024 Negative  Negative mg/dL Final   Blood, Urinalysis 09/27/2024 2+ (!)  Negative Final   Nitrite, Urinalysis 09/27/2024 Negative  Negative Final   Leukocyte Esterase, Urinalysis 09/27/2024 Trace (!)  Negative Final   Bilirubin, Urinalysis 09/27/2024 Negative  Negative Final   Urobilinogen, Urinalysis 09/27/2024 0.2  0.2 - 1.0 mg/dL Final   WBC, UA 89/84/7974 8 (H)  <=5 /hpf Final   Red Blood Cells, Urinalysis 09/27/2024 4 (H)  <=3 /hpf Final   Bacteria, Urinalysis 09/27/2024 0-5  0 - 5 /hpf Final   Squamous Epithelial Cells, Urinaly* 09/27/2024 6  /hpf Final   Complement C4, Serum - [facility] 09/27/2024 29  12 - 38 mg/dL Final   Complement C3, Serum - [facility] 09/27/2024 232 (H)  82 - 167 mg/dL Final   Anti-DNA (DS) Ab Qn - [facility] 09/27/2024 <1  0 - 9 IU/mL Final   Hepatitis B Surface  Antigen - [facility] 09/27/2024 Negative  Negative Final   Hep B Surface Ab, Qual - [facility] 09/27/2024 Non Reactive   Final   Hep B Core Total Ab - [facility] 09/27/2024 Negative  Negative Final   Interpretation - [facility] 09/27/2024 Comment   Final   HCV Ab - [facility] 09/27/2024 Non Reactive  Non Reactive Final  QuantiFERON Incubation - [facility] 09/27/2024 Incubation performed.   Final   QuantiFERON Criteria - [facility] 09/27/2024 Comment   Final   QuantiFERON TB1 Ag Value - [facility] 09/27/2024 0.04  IU/mL Final   QuantiFERON TB2 Ag Value - [facility] 09/27/2024 0.04  IU/mL Final   QuantiFERON Nil Value - [facility] 09/27/2024 0.03  IU/mL Final   QuantiFERON Mitogen Value - [facility] 09/27/2024 >10.00  IU/mL Final   QuantiFERON TB Gold - [facility] 09/27/2024 Negative  Negative Final   Interpretation: - [facility] 09/27/2024 Comment   Final    Assessment and Plan: Undifferentiated inflammatory arthritis  (primary encounter diagnosis) Plan: CBC w/auto Differential (5 Part), Hepatic        Function Panel (HFP), Creatinine, Hepatic        Function Panel (HFP), CBC w/auto Differential        (5 Part), Creatinine  ANA positive  Encounter for long-term (current) use of high-risk medication  Given interval history, we will await podiatry consent to proceed with methotrexate as previously planned with 15mg  once weekly. Labs in six weeks, OV in 8 weeks. Repeat baseline labs today.   Questions were welcomed and answered.  Follow up in 2 months.     Medication List      * Accurate as of November 08, 2024  1:54 PM. If you have any questions, ask your nurse or doctor.        CHANGE how you take these medications   metFORMIN 500 MG XR tablet Commonly known as: GLUCOPHAGE-XR Take 1 tablet (500 mg total) by mouth daily with dinner for 90 days What changed: how much to take     CONTINUE taking these medications   acetaminophen  325 MG tablet Commonly known as: TYLENOL    albuterol 90 mcg/actuation  inhaler Commonly known as: PROAIR RESPICLICK   celecoxib 200 MG capsule Commonly known as: CeleBREX   cholecalciferol 1000 unit capsule Commonly known as: VITAMIN D3 Take 1 capsule (1,000 Units total) by mouth once daily for 90 days   cyanocobalamin 1000 MCG tablet Commonly known as: VITAMIN B12 Take 1 tablet (1,000 mcg total) by mouth once daily for 90 days   diazePAM  2 MG tablet Commonly known as: VALIUM  Place 1 tablet in the vagina once a day as needed for vaginal pain   folic acid 1 MG tablet Commonly known as: FOLVITE Take 1 tablet (1 mg total) by mouth once daily for 360 days   hydrOXYzine  25 MG tablet Commonly known as: ATARAX  Take 1 tablet (25 mg total) by mouth 3 (three) times daily as needed for Anxiety   ibuprofen  800 MG tablet Commonly known as: MOTRIN    methotrexate 2.5 MG tablet Commonly known as: RHEUMATREX Take 6 tablets (15 mg total) by mouth every 7 (seven) days for 90 days   oxyCODONE -acetaminophen  5-325 mg tablet Commonly known as: PERCOCET Take 1 tablet by mouth every 6 (six) hours as needed for Pain Can take 1 to 2 tabs every 4 hours to 6 hours as needed for pain.  Max 6 tabs per day   progesterone  100 MG capsule Commonly known as: PROMETRIUM  Take 1 capsule (100 mg total) by mouth once daily for 90 days      Orders Placed This Encounter  Procedures   CBC w/auto Differential (5 Part)   Hepatic Function Panel (HFP)   Creatinine   Hepatic Function Panel (HFP)   CBC w/auto Differential (5 Part)   Creatinine   *Some images could not be shown.

## 2024-12-22 ENCOUNTER — Emergency Department

## 2024-12-22 ENCOUNTER — Emergency Department
Admission: EM | Admit: 2024-12-22 | Discharge: 2024-12-22 | Disposition: A | Discharge status: Home / Self Care | Source: Ambulatory Visit | Attending: Emergency Medicine | Admitting: Emergency Medicine

## 2024-12-22 ENCOUNTER — Other Ambulatory Visit: Payer: Self-pay

## 2024-12-22 DIAGNOSIS — R1013 Epigastric pain: Secondary | ICD-10-CM | POA: Insufficient documentation

## 2024-12-22 DIAGNOSIS — R0789 Other chest pain: Secondary | ICD-10-CM | POA: Insufficient documentation

## 2024-12-22 DIAGNOSIS — R079 Chest pain, unspecified: Secondary | ICD-10-CM | POA: Diagnosis present

## 2024-12-22 DIAGNOSIS — I10 Essential (primary) hypertension: Secondary | ICD-10-CM | POA: Diagnosis not present

## 2024-12-22 LAB — HEPATIC FUNCTION PANEL
ALT: 14 U/L (ref 0–44)
AST: 19 U/L (ref 15–41)
Albumin: 3.5 g/dL (ref 3.5–5.0)
Alkaline Phosphatase: 73 U/L (ref 38–126)
Bilirubin, Direct: <0.1 mg/dL (ref 0.0–0.2)
Total Bilirubin: 0.3 mg/dL (ref 0.0–1.2)
Total Protein: 6.3 g/dL — ABNORMAL LOW (ref 6.5–8.1)

## 2024-12-22 LAB — CBC
HCT: 45.8 % (ref 36.0–46.0)
Hemoglobin: 15 g/dL (ref 12.0–15.0)
MCH: 28.1 pg (ref 26.0–34.0)
MCHC: 32.8 g/dL (ref 30.0–36.0)
MCV: 85.9 fL (ref 80.0–100.0)
Platelets: 363 K/uL (ref 150–400)
RBC: 5.33 MIL/uL — ABNORMAL HIGH (ref 3.87–5.11)
RDW: 12.7 % (ref 11.5–15.5)
WBC: 10.7 K/uL — ABNORMAL HIGH (ref 4.0–10.5)
nRBC: 0 % (ref 0.0–0.2)

## 2024-12-22 LAB — BASIC METABOLIC PANEL WITH GFR
Anion gap: 10 (ref 5–15)
BUN: 15 mg/dL (ref 6–20)
CO2: 22 mmol/L (ref 22–32)
Calcium: 9.4 mg/dL (ref 8.9–10.3)
Chloride: 108 mmol/L (ref 98–111)
Creatinine, Ser: 0.68 mg/dL (ref 0.44–1.00)
GFR, Estimated: >60 mL/min
Glucose, Bld: 109 mg/dL — ABNORMAL HIGH (ref 70–99)
Potassium: 4 mmol/L (ref 3.5–5.1)
Sodium: 139 mmol/L (ref 135–145)

## 2024-12-22 LAB — LIPASE, BLOOD: Lipase: 33 U/L (ref 11–51)

## 2024-12-22 LAB — TROPONIN T, HIGH SENSITIVITY: Troponin T High Sensitivity: <15 ng/L (ref 0–19)

## 2024-12-22 MED ORDER — ONDANSETRON HCL 4 MG/2ML IJ SOLN
4.0000 mg | Freq: Once | INTRAMUSCULAR | Status: AC
  Administered 2024-12-22: 4 mg via INTRAVENOUS
  Filled 2024-12-22: qty 2

## 2024-12-22 MED ORDER — IOHEXOL 350 MG/ML SOLN
75.0000 mL | Freq: Once | INTRAVENOUS | Status: AC | PRN
  Administered 2024-12-22: 75 mL via INTRAVENOUS

## 2024-12-22 MED ORDER — OMEPRAZOLE MAGNESIUM 20 MG PO TBEC: 20.0000 mg | DELAYED_RELEASE_TABLET | Freq: Every day | ORAL | 0 refills | Status: AC

## 2024-12-22 MED ORDER — LIDOCAINE VISCOUS HCL 2 % MT SOLN
15.0000 mL | Freq: Once | OROMUCOSAL | Status: AC
  Administered 2024-12-22: 15 mL via ORAL
  Filled 2024-12-22: qty 15

## 2024-12-22 MED ORDER — ALUM & MAG HYDROXIDE-SIMETH 200-200-20 MG/5ML PO SUSP
30.0000 mL | Freq: Once | ORAL | Status: AC
  Administered 2024-12-22: 30 mL via ORAL
  Filled 2024-12-22: qty 30

## 2024-12-22 MED ORDER — MORPHINE SULFATE (PF) 4 MG/ML IV SOLN
4.0000 mg | Freq: Once | INTRAVENOUS | Status: AC
  Administered 2024-12-22: 4 mg via INTRAVENOUS
  Filled 2024-12-22: qty 1

## 2024-12-22 MED ORDER — ONDANSETRON 4 MG PO TBDP: 4.0000 mg | ORAL_TABLET | Freq: Three times a day (TID) | ORAL | 0 refills | Status: AC | PRN

## 2024-12-22 MED ORDER — FAMOTIDINE IN NACL 20-0.9 MG/50ML-% IV SOLN
20.0000 mg | Freq: Once | INTRAVENOUS | Status: AC
  Administered 2024-12-22: 20 mg via INTRAVENOUS
  Filled 2024-12-22: qty 50

## 2024-12-22 NOTE — ED Provider Notes (Signed)
 "  [facility] Provider Note    Event Date/Time   First MD Initiated Contact with Patient 12/22/24 1135     (approximate)   History   Chest Pain   HPI  Gina Farmer is a 39 y.o. female past medical history significant for prior pulmonary embolism not on anticoagulation, hypertension, PTSD, OSA, obesity, chronic pain, GERD, presents to the emergency department with chest pain.  Patient states that she started having severe chest pain last night, has gotten better since that time but has been ongoing which concerned her.  Epigastric abdominal pain that radiates to her back.  Associated with some mild nausea.  No significant shortness of breath.  Was concerned that she may have a pulmonary embolism.  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  No recent travel or surgery.  No significant new swelling to her legs.  Denies vomiting.  Not currently on any anticoagulation or PPI.  No diarrhea.  No dysuria.  Denies concern for pregnancy.  History of oophorectomy.  History of cholecystectomy     Physical Exam   Triage Vital Signs: ED Triage Vitals  Encounter Vitals Group     BP 12/22/24 1033 (!) 118/94     Girls Systolic BP Percentile --      Girls Diastolic BP Percentile --      Boys Systolic BP Percentile --      Boys Diastolic BP Percentile --      Pulse Rate 12/22/24 1033 67     Resp 12/22/24 1033 18     Temp 12/22/24 1033 98.5 F (36.9 C)     Temp Source 12/22/24 1033 Oral     SpO2 12/22/24 1033 96 %     Weight 12/22/24 1035 (!) 340 lb (154.2 kg)     Height 12/22/24 1035 5' 1 (1.549 m)     Head Circumference --      Peak Flow --      Pain Score 12/22/24 1035 8     Pain Loc --      Pain Education --      Exclude from Growth Chart --     Most recent vital signs: Vitals:   12/22/24 1033  BP: (!) 118/94  Pulse: 67  Resp: 18  Temp: 98.5 F (36.9 C)  SpO2: 96%     Physical Exam Constitutional:      Appearance: She is well-developed.  HENT:     Head: Atraumatic.  Eyes:     Conjunctiva/sclera: Conjunctivae normal.  Cardiovascular:     Rate and Rhythm: Regular rhythm.  Pulmonary:     Effort: No respiratory distress.  Abdominal:     General: There is no distension.     Palpations: Abdomen is soft.     Tenderness: There is abdominal tenderness (Epigastric abdominal tenderness to palpation with no rebound or guarding).  Musculoskeletal:        General: Normal range of motion.     Cervical back: Normal range of motion.     Right lower leg: No edema.     Left lower leg: No edema.  Skin:    General: Skin is warm.     Capillary Refill: Capillary refill takes less than 2 seconds.  Neurological:     General: No focal deficit present.     Mental Status: She is alert. Mental status is at baseline.     IMPRESSION /  MDM / ASSESSMENT AND PLAN / ED COURSE  I reviewed the triage vital signs and the nursing notes.  Differential diagnosis including dissection, pulmonary embolism, GERD/gastritis, pancreatitis  EKG  I, Clotilda Punter, the attending physician, personally viewed and interpreted this ECG.  EKG showed sinus rhythm.  No significant ST elevation or depression.  No findings of acute ischemia or dysrhythmia.  Normal intervals.  No chamber enlargement.  RADIOLOGY I independently reviewed imaging, my interpretation of imaging: Chest x-ray no acute findings.  No signs of pneumonia, widened mediastinum or pneumothorax.  No signs of pulmonary edema.  Read as no acute findings.  CTA with no signs of dissection or pulmonary embolism.  No acute findings.  LABS (all labs ordered are listed, but only abnormal results are displayed) Labs interpreted as -    Labs Reviewed  BASIC METABOLIC PANEL WITH GFR - Abnormal; Notable for the following components:      Result Value   Glucose, Bld 109 (*)    All other components within normal limits  CBC  - Abnormal; Notable for the following components:   WBC 10.7 (*)    RBC 5.33 (*)    All other components within normal limits  HEPATIC FUNCTION PANEL - Abnormal; Notable for the following components:   Total Protein 6.3 (*)    All other components within normal limits  LIPASE, BLOOD  TROPONIN T, HIGH SENSITIVITY     MDM  Patient's lab work overall reassuring mild leukocytosis of 10.7.  Creatinine at baseline with no significant electrolyte abnormality.  Troponin is undetectable.  Symptoms have been ongoing since yesterday.  Have low suspicion for ACS.  Possible gastritis/PUD given her symptoms of nausea and epigastric abdominal pain.  Given a GI cocktail and started on a PPI.  No signs of dissection or pulmonary embolism.  Low risk heart score and troponin is undetectable, atypical pain.  Have low suspicion for ACS.  No signs of dissection or pulmonary embolism.  Discussed tarting PPI and close follow-up with primary care physician.  Discussed tricked return precautions for any ongoing or worsening symptoms.  No questions or concerns at time of discharge.     PROCEDURES:  Critical Care performed: No  Procedures  Patient's presentation is most consistent with acute presentation with potential threat to life or bodily function.   MEDICATIONS ORDERED IN ED: Medications  famotidine  (PEPCID ) IVPB 20 mg premix (0 mg Intravenous Stopped 12/22/24 1307)  alum & mag hydroxide-simeth (MAALOX/MYLANTA) 200-200-20 MG/5ML suspension 30 mL (30 mLs Oral Given 12/22/24 1236)    And  lidocaine  (XYLOCAINE ) 2 % viscous mouth solution 15 mL (15 mLs Oral Given 12/22/24 1236)  ondansetron  (ZOFRAN ) injection 4 mg (4 mg Intravenous Given 12/22/24 1236)  iohexol  (OMNIPAQUE ) 350 MG/ML injection 75 mL (75 mLs Intravenous Contrast Given 12/22/24 1323)  morphine  (PF) 4 MG/ML injection 4 mg (4 mg Intravenous Given 12/22/24 1345)    FINAL CLINICAL IMPRESSION(S) / ED DIAGNOSES   Final diagnoses:  Atypical chest pain   Epigastric pain     Rx / DC Orders   ED Discharge Orders          Ordered    omeprazole  (PRILOSEC  OTC) 20 MG tablet  Daily        12/22/24 1445    ondansetron  (ZOFRAN -ODT) 4 MG disintegrating tablet  Every 8 hours PRN        12/22/24 1445             Note:  This document was prepared  using Conservation officer, historic buildings and may include unintentional dictation errors.   Suzanne Kirsch, MD 12/22/24 1549  "

## 2024-12-22 NOTE — ED Triage Notes (Signed)
 Pt presents to ED from home C/O intermittent chest pain and headache  since yesterday. Also reports cough X 3-4 days.

## 2024-12-22 NOTE — Discharge Instructions (Signed)
 You were seen in the emergency department for epigastric abdominal pain and chest pain.  You had a CT scan that did not show any signs of a blood clot.  No signs of pneumonia and no tears of your aorta.  You had a heart enzyme that was normal, do not believe you are having a heart attack today.  Your liver function test were reassuring.  You are given a prescription for acid reflux medication and nausea medication.  Follow-up closely with your primary care physician and return to the emergency department if you have any worsening symptoms or new symptoms.
